# Patient Record
Sex: Male | Born: 1975 | Race: Black or African American | Hispanic: No | Marital: Married | Smoking: Never smoker
Health system: Southern US, Community
[De-identification: ages and names within clinical notes are randomized; demographics above are authoritative.]

## PROBLEM LIST (undated history)

## (undated) DIAGNOSIS — I10 Essential (primary) hypertension: Secondary | ICD-10-CM

## (undated) DIAGNOSIS — Z8619 Personal history of other infectious and parasitic diseases: Secondary | ICD-10-CM

## (undated) HISTORY — DX: Personal history of other infectious and parasitic diseases: Z86.19

---

## 2009-05-12 HISTORY — PX: ACHILLES TENDON REPAIR: SUR1153

## 2009-11-08 ENCOUNTER — Ambulatory Visit (HOSPITAL_COMMUNITY): Admission: RE | Admit: 2009-11-08 | Discharge: 2009-11-08 | Payer: Self-pay | Admitting: Orthopaedic Surgery

## 2009-12-19 ENCOUNTER — Encounter (HOSPITAL_BASED_OUTPATIENT_CLINIC_OR_DEPARTMENT_OTHER): Admission: RE | Admit: 2009-12-19 | Discharge: 2010-03-06 | Payer: Self-pay | Admitting: Internal Medicine

## 2010-01-01 ENCOUNTER — Observation Stay (HOSPITAL_COMMUNITY): Admission: RE | Admit: 2010-01-01 | Discharge: 2010-01-02 | Payer: Self-pay | Admitting: Orthopaedic Surgery

## 2010-02-07 ENCOUNTER — Ambulatory Visit (HOSPITAL_COMMUNITY): Admission: RE | Admit: 2010-02-07 | Discharge: 2010-02-07 | Payer: Self-pay | Admitting: Plastic Surgery

## 2010-07-25 LAB — CBC
HCT: 42.2 % (ref 39.0–52.0)
MCH: 27.5 pg (ref 26.0–34.0)
MCHC: 34.4 g/dL (ref 30.0–36.0)
Platelets: 195 10*3/uL (ref 150–400)
RBC: 5.27 MIL/uL (ref 4.22–5.81)
RDW: 13.4 % (ref 11.5–15.5)

## 2010-07-25 LAB — SURGICAL PCR SCREEN
MRSA, PCR: NEGATIVE
Staphylococcus aureus: NEGATIVE

## 2010-07-26 LAB — SURGICAL PCR SCREEN: MRSA, PCR: NEGATIVE

## 2010-07-26 LAB — CBC
MCH: 27.7 pg (ref 26.0–34.0)
MCV: 79.6 fL (ref 78.0–100.0)
Platelets: 204 10*3/uL (ref 150–400)
RBC: 5.09 MIL/uL (ref 4.22–5.81)
WBC: 5.9 10*3/uL (ref 4.0–10.5)

## 2010-07-29 LAB — CBC
HCT: 43.6 % (ref 39.0–52.0)
Hemoglobin: 15 g/dL (ref 13.0–17.0)
MCH: 28.7 pg (ref 26.0–34.0)
MCHC: 34.3 g/dL (ref 30.0–36.0)

## 2011-10-30 ENCOUNTER — Ambulatory Visit: Payer: Self-pay | Admitting: Internal Medicine

## 2011-10-31 ENCOUNTER — Ambulatory Visit (INDEPENDENT_AMBULATORY_CARE_PROVIDER_SITE_OTHER): Payer: Managed Care, Other (non HMO) | Admitting: Internal Medicine

## 2011-10-31 ENCOUNTER — Encounter: Payer: Self-pay | Admitting: Internal Medicine

## 2011-10-31 ENCOUNTER — Ambulatory Visit: Payer: Managed Care, Other (non HMO) | Admitting: Internal Medicine

## 2011-10-31 VITALS — BP 138/80 | HR 84 | Temp 98.6°F | Ht 75.5 in | Wt 398.0 lb

## 2011-10-31 DIAGNOSIS — R200 Anesthesia of skin: Secondary | ICD-10-CM

## 2011-10-31 DIAGNOSIS — E669 Obesity, unspecified: Secondary | ICD-10-CM

## 2011-10-31 DIAGNOSIS — K625 Hemorrhage of anus and rectum: Secondary | ICD-10-CM | POA: Insufficient documentation

## 2011-10-31 DIAGNOSIS — R209 Unspecified disturbances of skin sensation: Secondary | ICD-10-CM

## 2011-10-31 DIAGNOSIS — E66813 Obesity, class 3: Secondary | ICD-10-CM | POA: Insufficient documentation

## 2011-10-31 DIAGNOSIS — Z23 Encounter for immunization: Secondary | ICD-10-CM

## 2011-10-31 DIAGNOSIS — R202 Paresthesia of skin: Secondary | ICD-10-CM

## 2011-10-31 HISTORY — DX: Anesthesia of skin: R20.0

## 2011-10-31 NOTE — Progress Notes (Signed)
Subjective:    Patient ID: Jeffery Benson, male    DOB: October 26, 1975, 36 y.o.   MRN: [id number]  HPI  36 year old African American male to establish. Patient denies any chronic medical problems. She reports history of torn left Achilles tendon 2011.  He complains of numbness and tingling of his right great toe for the last one to 2 months. Symptoms seem to be getting worse and numbness spreading to his second digit of right foot. He has rare symptoms of his left foot. Symptoms are constant and he denies associated weakness or foot drop. He denies any history of low back pain or injury.  No change in symptoms with wearing different shoes.  We reviewed his family history. His father was diagnosed with colon cancer at age 94. He has had intermittent blood in the stool over last several months. He has some pain with defecation. His last episode was 1 or 2 weeks go.    Patient has been struggling with obesity.  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 Since then he has gained approximately 100 pounds. He has family history of type 2 diabetes.  Review of Systems  Constitutional: Negative for activity change, appetite change and unexpected weight change.  Eyes: Negative for visual disturbance.  Respiratory: Negative for cough, chest tightness and shortness of breath.   Cardiovascular: Negative for chest pain.  Genitourinary: Negative for difficulty urinating.  Neurological: Negative for headaches.  Gastrointestinal: Negative for abdominal pain, heartburn  Psych: Negative for depression or anxiety Endo:  No polyuria or polydypsia    Past Medical History  Diagnosis Date  . History of chickenpox     History   Social History  . Marital Status: Married    Spouse Name: N/A    Number of Children: N/A  . Years of Education: N/A   Occupational History  . Not on file.   Social History Main Topics  . Smoking status: Never Smoker   . Smokeless tobacco: Not on  file  . Alcohol Use: No  . Drug Use: No  . Sexually Active: Not on file   Other Topics Concern  . Not on file   Social History Narrative  . No narrative on file    Past Surgical History  Procedure Date  . Achilles tendon repair 2011    Family History  Problem Relation Age of Onset  . Cancer Mother     bladder & ovarian  . Diabetes Mother   . Cancer Father     colon  . Depression Maternal Aunt   . Depression Maternal Grandmother   . Alcohol abuse Paternal Grandfather     No Known Allergies  No current outpatient prescriptions on file prior to visit.    BP 148/96  Pulse 84  Temp 98.6 F (37 C) (Oral)  Ht 6' 3.5" (1.918 m)  Wt 398 lb (180.532 kg)  BMI 49.09 kg/m2        Objective:   Physical Exam  Constitutional: He is oriented to person, place, and time.       Pleasant, obese 36 y/o AA male  HENT:  Head: Normocephalic and atraumatic.  Right Ear: External ear normal.  Left Ear: External ear normal.       Crowded oropharynx  Eyes: EOM are normal. Pupils are equal, round, and reactive to light.  Neck: Neck supple.  Cardiovascular: Normal rate, regular rhythm and normal heart sounds.   Pulmonary/Chest: Effort normal and breath sounds normal. He  has no wheezes.  Abdominal: Soft. Bowel sounds are normal. He exhibits no mass.  Genitourinary: Guaiac negative stool.       Limited rectal exam, no external hemorrhoids   Musculoskeletal: He exhibits no edema.  Lymphadenopathy:    He has no cervical adenopathy.  Neurological: He is alert and oriented to person, place, and time. No cranial nerve deficit.       Decreased sensation to vibration of his right great toe Vibratory sense is normal in left foot and upper extremities.  Skin:       Hyperpigmented areas beneath abdominal fold and groin area  Psychiatric: He has a normal mood and affect. His behavior is normal.          Assessment & Plan:

## 2011-10-31 NOTE — Patient Instructions (Addendum)
Start weight loss program.  Goal weight loss 40 lbs over next 6 months Please complete the following lab tests within 1 week (fasting): BMET, LFTs, FLP, CBCD - v70 A1c, TSH - 782.0

## 2011-10-31 NOTE — Assessment & Plan Note (Signed)
Patient having isolated numbness and tingling of his right great toe. I doubt his symptoms are from a systemic polyneuropathy. 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 Refer to Dr. Victorino Dike for further evaluation and treatment.

## 2011-10-31 NOTE — Assessment & Plan Note (Signed)
Weight loss strategies discussed. Goal weight loss 40 pounds over the next 6 months. Screen for diabetes. Obtain fasting lipid panel.

## 2011-10-31 NOTE — Assessment & Plan Note (Signed)
36 year old [place] American male with history of colon cancer with intermittent rectal bleeding. Limited rectal exam but he does not have any external hemorrhoids. Refer to gastroenterologist for colonoscopy.

## 2011-12-22 ENCOUNTER — Other Ambulatory Visit: Payer: Managed Care, Other (non HMO)

## 2011-12-25 ENCOUNTER — Other Ambulatory Visit (INDEPENDENT_AMBULATORY_CARE_PROVIDER_SITE_OTHER): Payer: Managed Care, Other (non HMO)

## 2011-12-25 ENCOUNTER — Encounter: Payer: Self-pay | Admitting: Gastroenterology

## 2011-12-25 DIAGNOSIS — R209 Unspecified disturbances of skin sensation: Secondary | ICD-10-CM

## 2011-12-25 LAB — HEPATIC FUNCTION PANEL
AST: 37 U/L (ref 0–37)
Albumin: 4.4 g/dL (ref 3.5–5.2)
Alkaline Phosphatase: 57 U/L (ref 39–117)
Bilirubin, Direct: 0.2 mg/dL (ref 0.0–0.3)
Total Bilirubin: 1.1 mg/dL (ref 0.3–1.2)
Total Protein: 7.6 g/dL (ref 6.0–8.3)

## 2011-12-25 LAB — LIPID PANEL
Cholesterol: 199 mg/dL (ref 0–200)
HDL: 109.8 mg/dL (ref >39.00)
VLDL: 10.4 mg/dL (ref 0.0–40.0)

## 2011-12-25 LAB — CBC WITH DIFFERENTIAL/PLATELET
Eosinophils Relative: 1.6 % (ref 0.0–5.0)
HCT: 44.5 % (ref 39.0–52.0)
MCHC: 33.3 g/dL (ref 30.0–36.0)
MCV: 82.9 fl (ref 78.0–100.0)
Monocytes Absolute: 0.4 10*3/uL (ref 0.1–1.0)
Neutro Abs: 3.2 10*3/uL (ref 1.4–7.7)
Neutrophils Relative %: 53.5 % (ref 43.0–77.0)
RBC: 5.36 Mil/uL (ref 4.22–5.81)
RDW: 13.9 % (ref 11.5–14.6)
WBC: 6 10*3/uL (ref 4.5–10.5)

## 2011-12-31 ENCOUNTER — Encounter: Payer: Self-pay | Admitting: Internal Medicine

## 2011-12-31 ENCOUNTER — Ambulatory Visit (INDEPENDENT_AMBULATORY_CARE_PROVIDER_SITE_OTHER): Payer: Managed Care, Other (non HMO) | Admitting: Internal Medicine

## 2011-12-31 VITALS — BP 136/70 | HR 75 | Temp 97.6°F | Ht 75.5 in | Wt 385.0 lb

## 2011-12-31 DIAGNOSIS — K625 Hemorrhage of anus and rectum: Secondary | ICD-10-CM

## 2011-12-31 DIAGNOSIS — E669 Obesity, unspecified: Secondary | ICD-10-CM

## 2011-12-31 DIAGNOSIS — R200 Anesthesia of skin: Secondary | ICD-10-CM

## 2011-12-31 DIAGNOSIS — R209 Unspecified disturbances of skin sensation: Secondary | ICD-10-CM

## 2011-12-31 NOTE — Assessment & Plan Note (Signed)
Improved.  Modest weight loss.  Continue lifestyle modification.

## 2011-12-31 NOTE — Assessment & Plan Note (Signed)
Improved.  A1c and TSH were normal.  He defers referral to orthopedist for now.

## 2011-12-31 NOTE — Assessment & Plan Note (Signed)
Patient urged to follow up with GI for colonoscopy.  He understands and agrees to reschedule colonoscopy.

## 2011-12-31 NOTE — Progress Notes (Signed)
  Subjective:    Patient ID: Jeffery Benson, male    DOB: 1976/02/26, 36 y.o.   MRN: [id number]  HPI  36 year old African American male previously seen for numbness and tingling of his right foot and rectal bleeding for followup. Patient's blood work did not show any signs of diabetes. Patient reports numbness and tingling of his foot has gotten somewhat better. He did not followup with orthopedic physician.  Rectal bleeding-due to scheduling issues patient did not complete colonoscopy.  He plans to reschedule.  Obesity - he has been able to lose modest amount of weight  Review of Systems   Past Medical History  Diagnosis Date  . History of chickenpox     History   Social History  . Marital Status: Married    Spouse Name: N/A    Number of Children: N/A  . Years of Education: N/A   Occupational History  . Not on file.   Social History Main Topics  . Smoking status: Never Smoker   . Smokeless tobacco: Not on file  . Alcohol Use: No  . Drug Use: No  . Sexually Active: Not on file   Other Topics Concern  . Not on file   Social History Narrative  . No narrative on file    Past Surgical History  Procedure Date  . Achilles tendon repair 2011    Family History  Problem Relation Age of Onset  . Cancer Mother     bladder & ovarian  . Diabetes Mother   . Cancer Father     colon  . Depression Maternal Aunt   . Depression Maternal Grandmother   . Alcohol abuse Paternal Grandfather     No Known Allergies  No current outpatient prescriptions on file prior to visit.    BP 136/70  Pulse 75  Temp 97.6 F (36.4 C) (Oral)  Ht 6' 3.5" (1.918 m)  Wt 385 lb (174.635 kg)  BMI 47.49 kg/m2       Objective:   Physical Exam  Constitutional: He appears well-developed and well-nourished.  Cardiovascular: Normal rate, regular rhythm and normal heart sounds.   Pulmonary/Chest: Effort normal and breath sounds normal. He has no wheezes.  Skin: Skin is warm and dry.    Psychiatric: He has a normal mood and affect. His behavior is normal.          Assessment & Plan:

## 2012-07-01 ENCOUNTER — Other Ambulatory Visit: Payer: Self-pay | Admitting: Orthopaedic Surgery

## 2012-07-01 DIAGNOSIS — M25511 Pain in right shoulder: Secondary | ICD-10-CM

## 2012-07-09 ENCOUNTER — Ambulatory Visit
Admission: RE | Admit: 2012-07-09 | Discharge: 2012-07-09 | Disposition: A | Discharge status: Home / Self Care | Payer: Managed Care, Other (non HMO) | Source: Ambulatory Visit | Attending: Orthopaedic Surgery | Admitting: Orthopaedic Surgery

## 2012-07-09 DIAGNOSIS — M25511 Pain in right shoulder: Secondary | ICD-10-CM

## 2012-07-22 ENCOUNTER — Other Ambulatory Visit: Payer: Self-pay | Admitting: Orthopedic Surgery

## 2012-07-22 DIAGNOSIS — M25511 Pain in right shoulder: Secondary | ICD-10-CM

## 2012-07-23 ENCOUNTER — Ambulatory Visit
Admission: RE | Admit: 2012-07-23 | Discharge: 2012-07-23 | Disposition: A | Discharge status: Home / Self Care | Payer: Managed Care, Other (non HMO) | Source: Ambulatory Visit | Attending: Orthopedic Surgery | Admitting: Orthopedic Surgery

## 2012-07-23 DIAGNOSIS — M25511 Pain in right shoulder: Secondary | ICD-10-CM

## 2012-07-29 ENCOUNTER — Other Ambulatory Visit: Payer: Self-pay | Admitting: Orthopedic Surgery

## 2012-07-29 DIAGNOSIS — M19019 Primary osteoarthritis, unspecified shoulder: Secondary | ICD-10-CM

## 2012-09-03 ENCOUNTER — Other Ambulatory Visit: Payer: Self-pay | Admitting: Orthopedic Surgery

## 2012-09-29 ENCOUNTER — Other Ambulatory Visit (HOSPITAL_COMMUNITY): Payer: Managed Care, Other (non HMO)

## 2012-10-07 ENCOUNTER — Encounter (HOSPITAL_COMMUNITY): Admission: RE | Payer: Self-pay | Source: Ambulatory Visit

## 2012-10-07 ENCOUNTER — Ambulatory Visit (HOSPITAL_COMMUNITY)
Admission: RE | Admit: 2012-10-07 | Payer: Managed Care, Other (non HMO) | Source: Ambulatory Visit | Admitting: Orthopedic Surgery

## 2012-10-07 SURGERY — HEMIARTHROPLASTY, SHOULDER
Anesthesia: Choice | Laterality: Right

## 2012-10-28 ENCOUNTER — Inpatient Hospital Stay: Admit: 2012-10-28 | Payer: Self-pay | Admitting: Orthopedic Surgery

## 2012-10-28 SURGERY — HEMIARTHROPLASTY, SHOULDER
Anesthesia: Choice | Laterality: Right

## 2012-11-02 ENCOUNTER — Encounter (HOSPITAL_COMMUNITY): Payer: Self-pay | Admitting: Pharmacy Technician

## 2012-11-03 ENCOUNTER — Encounter (HOSPITAL_COMMUNITY)
Admission: RE | Admit: 2012-11-03 | Discharge: 2012-11-03 | Disposition: A | Discharge status: Home / Self Care | Payer: Managed Care, Other (non HMO) | Source: Ambulatory Visit | Attending: Orthopedic Surgery | Admitting: Orthopedic Surgery

## 2012-11-03 ENCOUNTER — Encounter (HOSPITAL_COMMUNITY): Payer: Self-pay

## 2012-11-03 LAB — CBC WITH DIFFERENTIAL/PLATELET
Basophils Absolute: 0 10*3/uL (ref 0.0–0.1)
Basophils Relative: 1 % (ref 0–1)
Eosinophils Absolute: 0.1 10*3/uL (ref 0.0–0.7)
Hemoglobin: 15.3 g/dL (ref 13.0–17.0)
MCH: 27.9 pg (ref 26.0–34.0)
MCHC: 35 g/dL (ref 30.0–36.0)
Monocytes Relative: 6 % (ref 3–12)
Neutro Abs: 4.5 10*3/uL (ref 1.7–7.7)
Neutrophils Relative %: 68 % (ref 43–77)
Platelets: 188 10*3/uL (ref 150–400)
RDW: 13.3 % (ref 11.5–15.5)

## 2012-11-03 LAB — TYPE AND SCREEN: Antibody Screen: NEGATIVE

## 2012-11-03 LAB — COMPREHENSIVE METABOLIC PANEL
AST: 31 U/L (ref 0–37)
Albumin: 4 g/dL (ref 3.5–5.2)
Alkaline Phosphatase: 62 U/L (ref 39–117)
BUN: 17 mg/dL (ref 6–23)
Potassium: 4.4 mEq/L (ref 3.5–5.1)
Total Protein: 7.6 g/dL (ref 6.0–8.3)

## 2012-11-03 LAB — SURGICAL PCR SCREEN
MRSA, PCR: NEGATIVE
Staphylococcus aureus: POSITIVE — AB

## 2012-11-03 LAB — URINALYSIS, ROUTINE W REFLEX MICROSCOPIC
Bilirubin Urine: NEGATIVE
Ketones, ur: NEGATIVE mg/dL
Nitrite: NEGATIVE
Specific Gravity, Urine: 1.024 (ref 1.005–1.030)
Urobilinogen, UA: 0.2 mg/dL (ref 0.0–1.0)

## 2012-11-03 LAB — APTT: aPTT: 34 seconds (ref 24–37)

## 2012-11-03 LAB — PROTIME-INR
INR: 1.08 (ref 0.00–1.49)
Prothrombin Time: 13.8 seconds (ref 11.6–15.2)

## 2012-11-03 NOTE — Progress Notes (Signed)
Patient called and voicemail left inquiring about scheduled 0800 preadmit appointment.

## 2012-11-04 NOTE — Progress Notes (Signed)
This is a 37 year old male who is scheduled for right shoulder surgery to be performed by Dr. Jones Broom on Tuesday 09 November 2012.  A chart review was requested due to an abnormal EKG. A pre-operative EKG dated 03 November 2012 has been reviewed/confirmed by Dr. Laqueta Carina.  There is no known history of coronary artery disease, prior cardiac work-up/evaluation or apparent risk factors for CAD.   This patient did not voice any cardiac related symptoms, issues or complaints during his pre-anesthesia evaluation.   CXR dated 03 November 2012 and compared to previous films dated 07 February 2010 shows: No acute cardiopulmonary process.  Pre-operative lab results dated 03 November 2012 show all values to be within the normal range.  May proceed with surgery as scheduled.  Kelton Pillar. Everhart, PA-C

## 2012-11-08 MED ORDER — POVIDONE-IODINE 7.5 % EX SOLN
Freq: Once | CUTANEOUS | Status: DC
  Filled 2012-11-08: qty 118

## 2012-11-08 MED ORDER — CEFAZOLIN SODIUM-DEXTROSE 2-3 GM-% IV SOLR
2.0000 g | INTRAVENOUS | Status: AC
  Administered 2012-11-09: 2 g via INTRAVENOUS
  Filled 2012-11-08: qty 50

## 2012-11-09 ENCOUNTER — Inpatient Hospital Stay (HOSPITAL_COMMUNITY)
Admission: RE | Admit: 2012-11-09 | Discharge: 2012-11-10 | DRG: 483 | Disposition: A | Discharge status: Home / Self Care | Payer: Managed Care, Other (non HMO) | Source: Ambulatory Visit | Attending: Orthopedic Surgery | Admitting: Orthopedic Surgery

## 2012-11-09 ENCOUNTER — Encounter (HOSPITAL_COMMUNITY): Payer: Self-pay | Admitting: Anesthesiology

## 2012-11-09 ENCOUNTER — Inpatient Hospital Stay (HOSPITAL_COMMUNITY): Payer: Managed Care, Other (non HMO)

## 2012-11-09 ENCOUNTER — Ambulatory Visit (HOSPITAL_COMMUNITY): Payer: Managed Care, Other (non HMO) | Admitting: Anesthesiology

## 2012-11-09 ENCOUNTER — Encounter (HOSPITAL_COMMUNITY)
Admission: RE | Disposition: A | Discharge status: Home / Self Care | Payer: Self-pay | Source: Ambulatory Visit | Attending: Orthopedic Surgery

## 2012-11-09 DIAGNOSIS — Z01818 Encounter for other preprocedural examination: Secondary | ICD-10-CM

## 2012-11-09 DIAGNOSIS — M19019 Primary osteoarthritis, unspecified shoulder: Secondary | ICD-10-CM

## 2012-11-09 DIAGNOSIS — R9431 Abnormal electrocardiogram [ECG] [EKG]: Secondary | ICD-10-CM | POA: Diagnosis present

## 2012-11-09 DIAGNOSIS — Z6841 Body Mass Index (BMI) 40.0 and over, adult: Secondary | ICD-10-CM

## 2012-11-09 DIAGNOSIS — M12519 Traumatic arthropathy, unspecified shoulder: Principal | ICD-10-CM | POA: Diagnosis present

## 2012-11-09 DIAGNOSIS — Z01810 Encounter for preprocedural cardiovascular examination: Secondary | ICD-10-CM

## 2012-11-09 DIAGNOSIS — Z87828 Personal history of other (healed) physical injury and trauma: Secondary | ICD-10-CM

## 2012-11-09 DIAGNOSIS — Z01812 Encounter for preprocedural laboratory examination: Secondary | ICD-10-CM

## 2012-11-09 DIAGNOSIS — Z96611 Presence of right artificial shoulder joint: Secondary | ICD-10-CM

## 2012-11-09 HISTORY — PX: SHOULDER HEMI-ARTHROPLASTY: SHX5049

## 2012-11-09 SURGERY — HEMIARTHROPLASTY, SHOULDER
Anesthesia: General | Site: Shoulder | Laterality: Right | Wound class: Clean

## 2012-11-09 MED ORDER — POLYETHYLENE GLYCOL 3350 17 G PO PACK: 17.0000 g | PACK | Freq: Every day | ORAL | Status: DC | PRN

## 2012-11-09 MED ORDER — OXYCODONE-ACETAMINOPHEN 5-325 MG PO TABS
1.0000 | ORAL_TABLET | ORAL | Status: DC | PRN
  Administered 2012-11-10 (×3): 2 via ORAL
  Filled 2012-11-09 (×3): qty 2

## 2012-11-09 MED ORDER — OXYCODONE HCL 5 MG PO TABS: 5.0000 mg | ORAL_TABLET | ORAL | Status: DC | PRN

## 2012-11-09 MED ORDER — NEOSTIGMINE METHYLSULFATE 1 MG/ML IJ SOLN
INTRAMUSCULAR | Status: DC | PRN
  Administered 2012-11-09: 4 mg via INTRAVENOUS

## 2012-11-09 MED ORDER — ONDANSETRON HCL 4 MG/2ML IJ SOLN
INTRAMUSCULAR | Status: DC | PRN
  Administered 2012-11-09: 4 mg via INTRAVENOUS

## 2012-11-09 MED ORDER — ACETAMINOPHEN 325 MG PO TABS: 650.0000 mg | ORAL_TABLET | Freq: Four times a day (QID) | ORAL | Status: DC | PRN

## 2012-11-09 MED ORDER — ARTIFICIAL TEARS OP OINT
TOPICAL_OINTMENT | OPHTHALMIC | Status: DC | PRN
  Administered 2012-11-09: 1 via OPHTHALMIC

## 2012-11-09 MED ORDER — ACETAMINOPHEN 650 MG RE SUPP: 650.0000 mg | Freq: Four times a day (QID) | RECTAL | Status: DC | PRN

## 2012-11-09 MED ORDER — MORPHINE SULFATE 2 MG/ML IJ SOLN: 2.0000 mg | INTRAMUSCULAR | Status: DC | PRN

## 2012-11-09 MED ORDER — ROCURONIUM BROMIDE 100 MG/10ML IV SOLN
INTRAVENOUS | Status: DC | PRN
  Administered 2012-11-09 (×3): 20 mg via INTRAVENOUS
  Administered 2012-11-09: 10 mg via INTRAVENOUS
  Administered 2012-11-09: 30 mg via INTRAVENOUS

## 2012-11-09 MED ORDER — LIDOCAINE HCL (CARDIAC) 20 MG/ML IV SOLN
INTRAVENOUS | Status: DC | PRN
  Administered 2012-11-09: 100 mg via INTRAVENOUS

## 2012-11-09 MED ORDER — MEPERIDINE HCL 25 MG/ML IJ SOLN: 6.2500 mg | INTRAMUSCULAR | Status: DC | PRN

## 2012-11-09 MED ORDER — METOCLOPRAMIDE HCL 5 MG/ML IJ SOLN: 5.0000 mg | Freq: Three times a day (TID) | INTRAMUSCULAR | Status: DC | PRN

## 2012-11-09 MED ORDER — DEXAMETHASONE SODIUM PHOSPHATE 4 MG/ML IJ SOLN
INTRAMUSCULAR | Status: DC | PRN
  Administered 2012-11-09: 10 mg via INTRAVENOUS

## 2012-11-09 MED ORDER — FENTANYL CITRATE 0.05 MG/ML IJ SOLN: 25.0000 ug | INTRAMUSCULAR | Status: DC | PRN

## 2012-11-09 MED ORDER — FENTANYL CITRATE 0.05 MG/ML IJ SOLN
INTRAMUSCULAR | Status: DC | PRN
  Administered 2012-11-09 (×4): 50 ug via INTRAVENOUS

## 2012-11-09 MED ORDER — LACTATED RINGERS IV SOLN
INTRAVENOUS | Status: DC | PRN
  Administered 2012-11-09 (×2): via INTRAVENOUS

## 2012-11-09 MED ORDER — CEFAZOLIN SODIUM-DEXTROSE 2-3 GM-% IV SOLR
2.0000 g | Freq: Four times a day (QID) | INTRAVENOUS | Status: AC
  Administered 2012-11-09 – 2012-11-10 (×3): 2 g via INTRAVENOUS
  Filled 2012-11-09 (×4): qty 50

## 2012-11-09 MED ORDER — ALUMINUM HYDROXIDE GEL 320 MG/5ML PO SUSP
15.0000 mL | ORAL | Status: DC | PRN
  Filled 2012-11-09: qty 30

## 2012-11-09 MED ORDER — ASPIRIN EC 325 MG PO TBEC
325.0000 mg | DELAYED_RELEASE_TABLET | Freq: Two times a day (BID) | ORAL | Status: DC
  Administered 2012-11-09 – 2012-11-10 (×2): 325 mg via ORAL
  Filled 2012-11-09 (×4): qty 1

## 2012-11-09 MED ORDER — DOCUSATE SODIUM 100 MG PO CAPS
100.0000 mg | ORAL_CAPSULE | Freq: Two times a day (BID) | ORAL | Status: DC
  Administered 2012-11-09 – 2012-11-10 (×2): 100 mg via ORAL
  Filled 2012-11-09 (×4): qty 1

## 2012-11-09 MED ORDER — DIPHENHYDRAMINE HCL 12.5 MG/5ML PO ELIX: 12.5000 mg | ORAL_SOLUTION | ORAL | Status: DC | PRN

## 2012-11-09 MED ORDER — ROPIVACAINE HCL 5 MG/ML IJ SOLN
INTRAMUSCULAR | Status: DC | PRN
  Administered 2012-11-09 (×2): 15 mL

## 2012-11-09 MED ORDER — FLEET ENEMA 7-19 GM/118ML RE ENEM: 1.0000 | ENEMA | Freq: Once | RECTAL | Status: AC | PRN

## 2012-11-09 MED ORDER — SODIUM CHLORIDE 0.9 % IV SOLN
INTRAVENOUS | Status: DC
  Administered 2012-11-09: 21:00:00 via INTRAVENOUS

## 2012-11-09 MED ORDER — METHOCARBAMOL 500 MG PO TABS: 500.0000 mg | ORAL_TABLET | Freq: Four times a day (QID) | ORAL | Status: DC | PRN

## 2012-11-09 MED ORDER — MIDAZOLAM HCL 5 MG/5ML IJ SOLN
INTRAMUSCULAR | Status: DC | PRN
  Administered 2012-11-09: 2 mg via INTRAVENOUS

## 2012-11-09 MED ORDER — PROMETHAZINE HCL 25 MG/ML IJ SOLN: 6.2500 mg | INTRAMUSCULAR | Status: DC | PRN

## 2012-11-09 MED ORDER — OXYCODONE-ACETAMINOPHEN 5-325 MG PO TABS: 1.0000 | ORAL_TABLET | ORAL | Status: DC | PRN

## 2012-11-09 MED ORDER — PHENYLEPHRINE HCL 10 MG/ML IJ SOLN
INTRAMUSCULAR | Status: DC | PRN
  Administered 2012-11-09: 40 ug via INTRAVENOUS
  Administered 2012-11-09: 80 ug via INTRAVENOUS
  Administered 2012-11-09: 40 ug via INTRAVENOUS
  Administered 2012-11-09 (×2): 80 ug via INTRAVENOUS

## 2012-11-09 MED ORDER — ONDANSETRON HCL 4 MG/2ML IJ SOLN: 4.0000 mg | Freq: Four times a day (QID) | INTRAMUSCULAR | Status: DC | PRN

## 2012-11-09 MED ORDER — PROPOFOL 10 MG/ML IV BOLUS
INTRAVENOUS | Status: DC | PRN
  Administered 2012-11-09: 250 mg via INTRAVENOUS

## 2012-11-09 MED ORDER — MENTHOL 3 MG MT LOZG: 1.0000 | LOZENGE | OROMUCOSAL | Status: DC | PRN

## 2012-11-09 MED ORDER — SUCCINYLCHOLINE CHLORIDE 20 MG/ML IJ SOLN
INTRAMUSCULAR | Status: DC | PRN
  Administered 2012-11-09: 120 mg via INTRAVENOUS

## 2012-11-09 MED ORDER — METHOCARBAMOL 100 MG/ML IJ SOLN
500.0000 mg | Freq: Four times a day (QID) | INTRAVENOUS | Status: DC | PRN
  Filled 2012-11-09: qty 5

## 2012-11-09 MED ORDER — SODIUM CHLORIDE 0.9 % IR SOLN
Status: DC | PRN
  Administered 2012-11-09: 1000 mL

## 2012-11-09 MED ORDER — METOCLOPRAMIDE HCL 10 MG PO TABS
5.0000 mg | ORAL_TABLET | Freq: Three times a day (TID) | ORAL | Status: DC | PRN
Start: 2012-11-09 — End: 2012-11-10

## 2012-11-09 MED ORDER — PHENOL 1.4 % MT LIQD: 1.0000 | OROMUCOSAL | Status: DC | PRN

## 2012-11-09 MED ORDER — MIDAZOLAM HCL 2 MG/2ML IJ SOLN: 0.5000 mg | Freq: Once | INTRAMUSCULAR | Status: DC | PRN

## 2012-11-09 MED ORDER — ONDANSETRON HCL 4 MG PO TABS: 4.0000 mg | ORAL_TABLET | Freq: Four times a day (QID) | ORAL | Status: DC | PRN

## 2012-11-09 MED ORDER — HYDROCODONE-ACETAMINOPHEN 5-325 MG PO TABS: 1.0000 | ORAL_TABLET | ORAL | Status: DC | PRN

## 2012-11-09 MED ORDER — KETOROLAC TROMETHAMINE 30 MG/ML IJ SOLN: 15.0000 mg | Freq: Once | INTRAMUSCULAR | Status: DC | PRN

## 2012-11-09 MED ORDER — BISACODYL 5 MG PO TBEC: 5.0000 mg | DELAYED_RELEASE_TABLET | Freq: Every day | ORAL | Status: DC | PRN

## 2012-11-09 MED ORDER — ZOLPIDEM TARTRATE 5 MG PO TABS: 5.0000 mg | ORAL_TABLET | Freq: Every evening | ORAL | Status: DC | PRN

## 2012-11-09 MED ORDER — GLYCOPYRROLATE 0.2 MG/ML IJ SOLN
INTRAMUSCULAR | Status: DC | PRN
  Administered 2012-11-09: 0.6 mg via INTRAVENOUS

## 2012-11-09 SURGICAL SUPPLY — 70 items
ASSEMBLY NECK TAPER FIXED 135 (Orthopedic Implant) ×2 IMPLANT
BIT DRILL 5/64X5 DISP (BIT) ×2 IMPLANT
BLADE SAW SAG 73X25 THK (BLADE) ×1
BLADE SAW SGTL 73X25 THK (BLADE) ×1 IMPLANT
BLADE SURG 15 STRL LF DISP TIS (BLADE) ×2 IMPLANT
BLADE SURG 15 STRL SS (BLADE) ×2
BOWL SMART MIX CTS (DISPOSABLE) ×2 IMPLANT
CHLORAPREP W/TINT 26ML (MISCELLANEOUS) ×2 IMPLANT
CLOTH BEACON ORANGE TIMEOUT ST (SAFETY) ×2 IMPLANT
COVER SURGICAL LIGHT HANDLE (MISCELLANEOUS) ×2 IMPLANT
DRAPE INCISE IOBAN 66X45 STRL (DRAPES) ×4 IMPLANT
DRAPE SURG 17X23 STRL (DRAPES) ×2 IMPLANT
DRAPE U-SHAPE 47X51 STRL (DRAPES) ×2 IMPLANT
DRSG MEPILEX BORDER 4X12 (GAUZE/BANDAGES/DRESSINGS) ×2 IMPLANT
DRSG MEPILEX BORDER 4X4 (GAUZE/BANDAGES/DRESSINGS) ×2 IMPLANT
DRSG PAD ABDOMINAL 8X10 ST (GAUZE/BANDAGES/DRESSINGS) ×4 IMPLANT
ELECT BLADE 4.0 EZ CLEAN MEGAD (MISCELLANEOUS) ×2
ELECT CAUTERY BLADE 6.4 (BLADE) ×2 IMPLANT
ELECT REM PT RETURN 9FT ADLT (ELECTROSURGICAL) ×2
ELECTRODE BLDE 4.0 EZ CLN MEGD (MISCELLANEOUS) ×1 IMPLANT
ELECTRODE REM PT RTRN 9FT ADLT (ELECTROSURGICAL) ×1 IMPLANT
EVACUATOR 1/8 PVC DRAIN (DRAIN) ×4 IMPLANT
GLOVE BIO SURGEON STRL SZ7 (GLOVE) ×2 IMPLANT
GLOVE BIO SURGEON STRL SZ7.5 (GLOVE) ×2 IMPLANT
GLOVE BIOGEL PI IND STRL 8 (GLOVE) ×1 IMPLANT
GLOVE BIOGEL PI INDICATOR 8 (GLOVE) ×1
GOWN PREVENTION PLUS LG XLONG (DISPOSABLE) ×2 IMPLANT
GOWN STRL NON-REIN LRG LVL3 (GOWN DISPOSABLE) ×4 IMPLANT
HANDPIECE INTERPULSE COAX TIP (DISPOSABLE) ×1
HEAD HUMERAL ECC 52X18MM (Head) ×2 IMPLANT
HEMOSTAT SURGICEL 2X14 (HEMOSTASIS) IMPLANT
HOOD PEEL AWAY FACE SHEILD DIS (HOOD) ×4 IMPLANT
KIT BASIN OR (CUSTOM PROCEDURE TRAY) ×2 IMPLANT
KIT ROOM TURNOVER OR (KITS) ×2 IMPLANT
MANIFOLD NEPTUNE II (INSTRUMENTS) ×2 IMPLANT
NEEDLE HYPO 25GX1X1/2 BEV (NEEDLE) ×2 IMPLANT
NEEDLE MAYO TROCAR (NEEDLE) ×2 IMPLANT
NS IRRIG 1000ML POUR BTL (IV SOLUTION) ×2 IMPLANT
PACK SHOULDER (CUSTOM PROCEDURE TRAY) ×2 IMPLANT
PAD ARMBOARD 7.5X6 YLW CONV (MISCELLANEOUS) ×4 IMPLANT
RETRIEVER SUT HEWSON (MISCELLANEOUS) ×2 IMPLANT
SET HNDPC FAN SPRY TIP SCT (DISPOSABLE) ×1 IMPLANT
SET PAD SHOULDER ACCESS (MISCELLANEOUS) ×2 IMPLANT
SLING ARM IMMOBILIZER LRG (SOFTGOODS) ×2 IMPLANT
SLING ARM IMMOBILIZER MED (SOFTGOODS) IMPLANT
SPONGE GAUZE 4X4 12PLY (GAUZE/BANDAGES/DRESSINGS) ×2 IMPLANT
SPONGE LAP 18X18 X RAY DECT (DISPOSABLE) ×2 IMPLANT
STAPLER VISISTAT 35W (STAPLE) ×2 IMPLANT
STEM GLOBAL AP 12MM (Stem) ×2 IMPLANT
STRIP CLOSURE SKIN 1/2X4 (GAUZE/BANDAGES/DRESSINGS) ×2 IMPLANT
SUCTION FRAZIER TIP 10 FR DISP (SUCTIONS) ×2 IMPLANT
SUPPORT WRAP ARM LG (MISCELLANEOUS) ×2 IMPLANT
SUT ETHIBOND NAB CT1 #1 30IN (SUTURE) ×8 IMPLANT
SUT ETHILON 3 0 PS 1 (SUTURE) ×2 IMPLANT
SUT FIBERWIRE #2 38 T-5 BLUE (SUTURE) ×6
SUT MNCRL AB 4-0 PS2 18 (SUTURE) ×2 IMPLANT
SUT SILK 2 0 TIES 17X18 (SUTURE) ×1
SUT SILK 2-0 18XBRD TIE BLK (SUTURE) ×1 IMPLANT
SUT VIC AB 0 CT1 27 (SUTURE) ×1
SUT VIC AB 0 CT1 27XBRD ANBCTR (SUTURE) ×1 IMPLANT
SUT VIC AB 0 CTB1 27 (SUTURE) ×4 IMPLANT
SUT VIC AB 2-0 CT1 27 (SUTURE) ×3
SUT VIC AB 2-0 CT1 TAPERPNT 27 (SUTURE) ×3 IMPLANT
SUTURE FIBERWR #2 38 T-5 BLUE (SUTURE) ×3 IMPLANT
SYR CONTROL 10ML LL (SYRINGE) ×2 IMPLANT
TOWEL OR 17X24 6PK STRL BLUE (TOWEL DISPOSABLE) ×2 IMPLANT
TOWEL OR 17X26 10 PK STRL BLUE (TOWEL DISPOSABLE) ×2 IMPLANT
TRAY FOLEY CATH 14FR (SET/KITS/TRAYS/PACK) IMPLANT
WATER STERILE IRR 1000ML POUR (IV SOLUTION) ×2 IMPLANT
YANKAUER SUCT BULB TIP NO VENT (SUCTIONS) ×2 IMPLANT

## 2012-11-09 NOTE — Anesthesia Postprocedure Evaluation (Signed)
Anesthesia Post Note  Patient: Jeffery Benson  Procedure(s) Performed: Procedure(s) (LRB): RIGHT SHOULDER HEMI-ARTHROPLASTY (Right)  Anesthesia type: GA  Patient location: PACU  Post pain: Pain level controlled  Post assessment: Post-op Vital signs reviewed  Last Vitals:  Filed Vitals:   11/09/12 1145  BP: 141/72  Pulse: 73  Temp:   Resp: 25    Post vital signs: Reviewed  Level of consciousness: sedated  Complications: No apparent anesthesia complications

## 2012-11-09 NOTE — Anesthesia Preprocedure Evaluation (Signed)
Anesthesia Evaluation  Patient identified by MRN, date of birth, ID band Patient awake    Reviewed: Allergy & Precautions, H&P , Patient's Chart, lab work & pertinent test results, reviewed documented beta blocker date and time   History of Anesthesia Complications Negative for: history of anesthetic complications  Airway Mallampati: III TM Distance: >3 FB Neck ROM: full    Dental no notable dental hx.    Pulmonary neg pulmonary ROS,  breath sounds clear to auscultation  Pulmonary exam normal       Cardiovascular Exercise Tolerance: Good negative cardio ROS  Rhythm:regular Rate:Normal     Neuro/Psych negative neurological ROS  negative psych ROS   GI/Hepatic negative GI ROS, Neg liver ROS,   Endo/Other  negative endocrine ROSMorbid obesity  Renal/GU negative Renal ROS     Musculoskeletal   Abdominal   Peds  Hematology negative hematology ROS (+)   Anesthesia Other Findings   Reproductive/Obstetrics negative OB ROS                           Anesthesia Physical Anesthesia Plan  ASA: III  Anesthesia Plan: General ETT   Post-op Pain Management:    Induction:   Airway Management Planned:   Additional Equipment:   Intra-op Plan:   Post-operative Plan:   Informed Consent: I have reviewed the patients History and Physical, chart, labs and discussed the procedure including the risks, benefits and alternatives for the proposed anesthesia with the patient or authorized representative who has indicated his/her understanding and acceptance.   Dental Advisory Given  Plan Discussed with: CRNA and Surgeon  Anesthesia Plan Comments:         Anesthesia Quick Evaluation

## 2012-11-09 NOTE — Progress Notes (Signed)
UR Chart Review Completed  

## 2012-11-09 NOTE — Preoperative (Signed)
Beta Blockers   Reason not to administer Beta Blockers:Not Applicable 

## 2012-11-09 NOTE — Anesthesia Procedure Notes (Addendum)
Anesthesia Regional Block:  Interscalene brachial plexus block  Pre-Anesthetic Checklist: ,, timeout performed, Correct Patient, Correct Site, Correct Laterality, Correct Procedure, Correct Position, site marked, Risks and benefits discussed,  Surgical consent,  Pre-op evaluation,  At surgeon's request and post-op pain management  Laterality: Right  Prep: chloraprep       Needles:  Injection technique: Single-shot  Needle Type: Stimiplex          Additional Needles:  Procedures: ultrasound guided (picture in chart) Interscalene brachial plexus block Narrative:  Start time: 11/09/2012 7:00 AM End time: 11/09/2012 7:20 AM Injection made incrementally with aspirations every 5 mL.  Performed by: Personally   Additional Notes: Risks, benefits and alternative to block explained extensively.  Patient tolerated procedure well, without complications.  Image acquired and in chart  Supraclavicular block Procedure Name: Intubation Date/Time: 11/09/2012 7:50 AM Performed by: Gayla Medicus Pre-anesthesia Checklist: Patient identified, Timeout performed, Emergency Drugs available, Suction available and Patient being monitored Patient Re-evaluated:Patient Re-evaluated prior to inductionOxygen Delivery Method: Circle system utilized Preoxygenation: Pre-oxygenation with 100% oxygen Intubation Type: IV induction Ventilation: Mask ventilation without difficulty and Oral airway inserted - appropriate to patient size Laryngoscope Size: Mac and 4 Grade View: Grade II Tube type: Oral Tube size: 7.5 mm Number of attempts: 2 Airway Equipment and Method: Stylet and Video-laryngoscopy Placement Confirmation: ETT inserted through vocal cords under direct vision,  positive ETCO2 and breath sounds checked- equal and bilateral Secured at: 24 cm Tube secured with: Tape Dental Injury: Teeth and Oropharynx as per pre-operative assessment  Difficulty Due To: Difficulty was anticipated Future  Recommendations: Recommend- induction with short-acting agent, and alternative techniques readily available Comments: Easy mask w/ 9cm OA. First DL attempt unsuccessful with smaller glidescope blade d/t large tongue; second attempt with larger glidescope blade successful; both attempts atraumatic. VSS throughout. K Hypes, CRNA

## 2012-11-09 NOTE — Op Note (Signed)
Procedure(s): RIGHT SHOULDER HEMI-ARTHROPLASTY Procedure Note  Jeffery Benson male 37 y.o. 11/09/2012  Procedure(s) and Anesthesia Type:    * RIGHT SHOULDER HEMI-ARTHROPLASTY - Choice  Surgeon(s) and Role:    * Mable Paris, MD - Primary   Indications:  37 y.o. male  With endstage right shoulder arthritis. Pain and dysfunction interfered with quality of life and nonoperative treatment with activity modification, NSAIDS and injections failed.     Surgeon: Mable Paris   Assistants: Damita Lack PA-C [facility] was present and scrubbed throughout the procedure and was essential in positioning, retraction, exposure, and closure)  Anesthesia: General endotracheal anesthesia with preoperative interscalene block    Procedure Detail  RIGHT SHOULDER HEMI-ARTHROPLASTY  Findings: DePuy size 12 porous coated press stem with a 52 x 18 eccentric head. The glenoid was noted to be posteriorly worn and slightly retroverted. After careful consideration of options for the glenoid it was determined that the best course of treatment for him would be to leave it alone and perform a arthroplasty. Hypertrophied labrum and significant amount of contracted anterior capsule were resected.  Estimated Blood Loss:  300 mL         Drains: 1 medium hemovac  Blood Given: none          Specimens: none        Complications:  * No complications entered in OR log *         Disposition: PACU - hemodynamically stable.         Condition: stable    Procedure:   The patient was identified in the preoperative holding area where I personally marked the operative extremity after verifying with the patient and consent. He  was taken to the operating room where He was transferred to the   operative table.  The patient received an interscalene block in   the holding area by the attending anesthesiologist.  General anesthesia was induced   in the operating room without complication.   The patient did receive IV  Ancef prior to the commencement of the procedure.  The patient was   placed in the beach-chair position with the back raised about 30   degrees.  The nonoperative extremity and head and neck were carefully   positioned and padded protecting against neurovascular compromise.  The   left upper extremity was then prepped and draped in the standard sterile   fashion.    The appropriate operative time-out was performed with   Anesthesia, the perioperative staff, as well as myself and we all agreed   that the right side was the correct operative site.  An approximately   10 cm incision was made from the tip of the coracoid to the center point of the   humerus at the level of the axilla.  Dissection was carried down sharply   through subcutaneous tissues and cephalic vein was identified and taken   laterally with the deltoid.  The pectoralis major was taken medially.  The   upper 1 cm of the pectoralis major was released from its attachment on   the humerus.  The clavipectoral fascia was incised just lateral to the   conjoined tendon.  This incision was carried up to but not into the   coracoacromial ligament.  Digital palpation was used to prove   integrity of the axillary nerve which was protected throughout the   procedure.  Musculocutaneous nerve was not palpated in the operative   field.  Conjoined tendon was then retracted  gently medially and the   deltoid laterally.  Anterior circumflex humeral vessels were clamped and   coagulated.  The soft tissues overlying the biceps was incised and this   incision was carried across the transverse humeral ligament to the base   of the coracoid.  The biceps was tenodesed to the soft tissue just above   pectoralis major and the remaining portion of the biceps superiorly was   excised.  An osteotomy was performed at the lesser tuberosity and the   subscapularis was freed from the underlying capsule.  Capsule was then    released all the way down to the 6 o'clock position of the humeral head.   The humeral head was then delivered with simultaneous adduction,   extension and external rotation.  He was noted to have extensive degeneration of the humeral head with large cavitary defects and a very large inferior humeral head osteophyte. All humeral osteophytes were removed   and the anatomic neck of the humerus was marked and cut free hand at   approximately 25 degrees retroversion within about 3 mm of the cuff   reflection posteriorly.  The head size was estimated to be a 52 medium   offset.  At that point, the humeral head was retracted posteriorly with   a Fukuda retractor and the anterior-inferior capsule was excised.   Remaining portion of the capsule was released at the base of the   coracoid.  The remaining biceps anchor and the entire anterior-inferior   labrum was excised.  The posterior labrum was diminutive and was not released or excised.  The glenoid was examined. There was a very slight biconcavity which was not dramatic. The anterior glenoid still had some cartilage intact. Posteriorly the cartilage was gone. I felt that at this point the amount of defect did not warrant bone grafting and that a significant amount of the posterior intact glenoid and need to be removed it bone grafting was to be considered. I did not feel that the risk is worth the benefit in this patient.  The proximal humerus was then again exposed taking care not to displace the glenoid.    The humerus was then sequentially reamed going from 6 to 12 by 2 mm incriments. The 12 mm reamer was found to have appropriate cortical contact.  A   box osteotome was then used and a 12-mm broach.  The broach handle was   removed and the trial head was placed.   Calcar reamer was used.The eccentric 52 x 18 head fit best.    With trial reduction there was no tendency towards posterior instability. The trial was removed and the final implant was prepared  on a back table.  The trial was removed and the final implant was prepared on a back table.   Small holes were drilled on both sides of the lesser tuberosity osteotomy, through which 3 #2 FiberWires were passed. The implant was then placed through the loop of all 3 #2 FiberWires and impacted with an excellent press-fit. This achieved excellent anatomic reconstruction of the proximal humerus.  The joint was then copiously irrigated with pulse lavage.  The subscapularis and   lesser tuberosity osteotomy were then repaired using the 3 #2 fiber wires previously passed.   One #1 Ethibond was placed at the rotator interval just above   the lesser tuberosity. After repair of the lesser tuberosity, a medium   Hemovac was placed out anterolaterally and again copious irrigation was   used.  Skin was closed with 2-0 Vicryl sutures in the deep dermal layer and 4-0 Monocryl in a subcuticular  running fashion.  Sterile dressings were then applied including Steri- Strips, 4x4s, ABDs and tape.  The patient was placed in a sling and allowed to awaken from general anesthesia and taken to the recovery room in stable  condition.      POSTOPERATIVE PLAN:  Early passive range of motion will be allowed with the goal of 30 degrees external rotation and 130 degrees forward elevation.  No internal rotation at this time.  No active motion of the arm until the lesser tuberosity heals.  The patient will likely be kept in the hospital for 1-2 days and then discharged home.

## 2012-11-09 NOTE — Transfer of Care (Signed)
Immediate Anesthesia Transfer of Care Note  Patient: Jeffery Benson  Procedure(s) Performed: Procedure(s): RIGHT SHOULDER HEMI-ARTHROPLASTY (Right)  Patient Location: PACU  Anesthesia Type:General  Level of Consciousness: awake, alert  and oriented  Airway & Oxygen Therapy: Patient Spontanous Breathing and Patient connected to nasal cannula oxygen  Post-op Assessment: Report given to PACU RN, Post -op Vital signs reviewed and stable and Patient moving all extremities X 4  Post vital signs: Reviewed and stable  Complications: No apparent anesthesia complications

## 2012-11-09 NOTE — H&P (Signed)
Jeffery Benson is an 37 y.o. male.   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 He has small posterior glenoid defect. Pain and dysfunction interfered with quality of life. He has failed conservative management and wished to go forward with an arthroplasty procedure to decrease pain and increase function.  Past Medical History  Diagnosis Date  . History of chickenpox     Past Surgical History  Procedure Laterality Date  . Achilles tendon repair  2011    Family History  Problem Relation Age of Onset  . Cancer Mother     bladder & ovarian  . Diabetes Mother   . Cancer Father     colon  . Depression Maternal Aunt   . Depression Maternal Grandmother   . Alcohol abuse Paternal Grandfather    Social History:  reports that he has never smoked. He does not have any smokeless tobacco history on file. He reports that  drinks alcohol. He reports that he does not use illicit drugs.  Allergies: No Known Allergies  No prescriptions prior to admission    No results found for this or any previous visit (from the past 48 hour(s)). No results found.  Review of Systems  All other systems reviewed and are negative.    There were no vitals taken for this visit. Physical Exam  Constitutional: He is oriented to person, place, and time. He appears well-developed and well-nourished.  HENT:  Head: Atraumatic.  Eyes: EOM are normal.  Cardiovascular: Intact distal pulses.   Respiratory: Effort normal.  Musculoskeletal:       Right shoulder: He exhibits decreased range of motion and pain. He exhibits no deformity and normal pulse.  Neurological: He is alert and oriented to person, place, and time.  Skin: Skin is warm and dry.  Psychiatric: He has a normal mood and affect.     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 Risks / benefits of surgery discussed Consent on chart  NPO for OR Preop antibiotics   Jozelyn Kuwahara WILLIAM 11/09/2012, 7:14 AM

## 2012-11-10 LAB — BASIC METABOLIC PANEL
CO2: 24 mEq/L (ref 19–32)
Chloride: 101 mEq/L (ref 96–112)
Glucose, Bld: 166 mg/dL — ABNORMAL HIGH (ref 70–99)
Potassium: 3.5 mEq/L (ref 3.5–5.1)
Sodium: 135 mEq/L (ref 135–145)

## 2012-11-10 LAB — CBC
Hemoglobin: 12.7 g/dL — ABNORMAL LOW (ref 13.0–17.0)
MCH: 27.9 pg (ref 26.0–34.0)
Platelets: 167 10*3/uL (ref 150–400)
RBC: 4.56 MIL/uL (ref 4.22–5.81)
WBC: 13.6 10*3/uL — ABNORMAL HIGH (ref 4.0–10.5)

## 2012-11-10 NOTE — Evaluation (Signed)
Occupational Therapy Evaluation Patient Details Name: Jeffery Benson MRN: [id number] DOB: 12/31/1975 Today's Date: 11/10/2012 Time: 1008-1100 OT Time Calculation (min): 52 min  OT Assessment / Plan / Recommendation History of present illness s/p R shoulder hemiarthroplasty   Clinical Impression   Pt instructed in R shoulder PROM in supine, AROM elbow to hand, hemitechniques for ADL, sling use, bed and chair positioning.  Verbalized and/or demonstrated all.  No further OT needs.    OT Assessment  Patient does not need any further OT services    Follow Up Recommendations  No OT follow up    Barriers to Discharge      Equipment Recommendations       Recommendations for Other Services    Frequency       Precautions / Restrictions Precautions Precautions: Shoulder Type of Shoulder Precautions:  R shoulder FF to 130, ER to 30 per Dr. Veda Canning protcol Shoulder Interventions: Shoulder sling/immobilizer Precaution Booklet Issued: Yes (comment) Precaution Comments: shoulder protocol   Pertinent Vitals/Pain "mild" R shoulder, premedicated, repositioned    ADL  Eating/Feeding: Independent Where Assessed - Eating/Feeding: Edge of bed Grooming: Modified independent;Wash/dry hands Where Assessed - Grooming: Unsupported standing Upper Body Bathing: Modified independent Where Assessed - Upper Body Bathing: Unsupported sitting Lower Body Bathing: Modified independent Where Assessed - Lower Body Bathing: Supported sit to stand Upper Body Dressing: Modified independent Where Assessed - Upper Body Dressing: Unsupported sitting Lower Body Dressing: Modified independent Where Assessed - Lower Body Dressing: Unsupported sitting;Supported sit to stand Toilet Transfer: Independent Toilet Transfer Method: Sit to Barista: Regular height toilet Toileting - Clothing Manipulation and Hygiene: Modified independent Where Assessed - Engineer, mining and  Hygiene: Sit to stand from 3-in-1 or toilet Equipment Used:  (sling) Transfers/Ambulation Related to ADLs: independent ADL Comments: Instructed in hemitechniques and sling donning and doffing.    OT Diagnosis:    OT Problem List:   OT Treatment Interventions:     OT Goals(Current goals can be found in the care plan section) Acute Rehab OT Goals Patient Stated Goal: Regain use of R shoulder.  Visit Information  Last OT Received On: 11/10/12 History of Present Illness: s/p R shoulder hemiarthroplasty       Prior Functioning     Home Living Family/patient expects to be discharged to:: Private residence Living Arrangements: Spouse/significant other;Children Prior Function Level of Independence: Independent Communication Communication: No difficulties Dominant Hand: Right         Vision/Perception Vision - History Baseline Vision: Wears glasses all the time Patient Visual Report: No change from baseline   Cognition  Cognition Arousal/Alertness: Awake/alert Behavior During Therapy: WFL for tasks assessed/performed Overall Cognitive Status: Within Functional Limits for tasks assessed    Extremity/Trunk Assessment Upper Extremity Assessment Upper Extremity Assessment: RUE deficits/detail RUE Deficits / Details: R UE in sling Cervical / Trunk Assessment Cervical / Trunk Assessment: Normal     Mobility Bed Mobility Bed Mobility: Supine to Sit;Sit to Supine Supine to Sit: 7: Independent Sit to Supine: 7: Independent Transfers Transfers: Sit to Stand;Stand to Sit Sit to Stand: 7: Independent Stand to Sit: 7: Independent     Exercise     Balance     End of Session OT - End of Session Activity Tolerance: Patient tolerated treatment well Patient left: in bed;with call bell/phone within reach;with family/visitor present  GO     Evern Bio 11/10/2012, 11:09 AM [phone]

## 2012-11-10 NOTE — Progress Notes (Signed)
PATIENT ID: Jeffery Benson   1 Day Post-Op Procedure(s) (LRB): RIGHT SHOULDER HEMI-ARTHROPLASTY (Right)  Subjective: feeling pretty good today.  Some soreness in the right arm, but is tolerating the pain well with the Percocet.  No other complaints or concerns today.  Objective:  Filed Vitals:   11/10/12 0544  BP: 160/59  Pulse: 73  Temp: 98.7 F (37.1 C)  Resp: 16     Awake alert and oriented Right upper extremity just doing with small spot of blood otherwise clean and intact Wiggles fingers, distally NVI Fires deltoid Drain removed today   Labs:   Recent Labs  11/10/12 0634  HGB 12.7*   Recent Labs  11/10/12 0634  WBC 13.6*  RBC 4.56  HCT 37.2*  PLT 167   Recent Labs  11/10/12 0634  NA 135  K 3.5  CL 101  CO2 24  BUN 13  CREATININE 0.81  GLUCOSE 166*  CALCIUM 8.9    Assessment and Plan: 1 day s/p right shoulder hemiarthrosplasty Pain is well-controlled, continue current pain management Will work with occupational therapy today end hand wrist elbow exercises and passive range of motion limited to 30 external rotation and 130 forward flexion Okay to discharge him today after OT Percocet for home pain control, scripts and chart    VTE proph: ASA 325 mg twice a day, SCDs

## 2012-11-11 ENCOUNTER — Encounter (HOSPITAL_COMMUNITY): Payer: Self-pay | Admitting: Orthopedic Surgery

## 2012-11-11 DIAGNOSIS — M19019 Primary osteoarthritis, unspecified shoulder: Secondary | ICD-10-CM

## 2012-11-11 HISTORY — DX: Primary osteoarthritis, unspecified shoulder: M19.019

## 2012-11-11 NOTE — Discharge Summary (Signed)
Patient ID: KEESHAWN FAKHOURI MRN: [id number] DOB/AGE: [birth year] 37 y.o.  Admit date: 11/09/2012 Discharge date: 11/11/2012  Admission Diagnoses:  Principal Problem:   Shoulder arthritis   Discharge Diagnoses:  Same  Past Medical History  Diagnosis Date  . History of chickenpox     Surgeries: Procedure(s): RIGHT SHOULDER HEMI-ARTHROPLASTY on 11/09/2012   Consultants:    Discharged Condition: Improved  Hospital Course: HUSTON STONEHOCKER is an 37 y.o. male who was admitted 11/09/2012 for operative treatment ofShoulder arthritis. 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 He has small posterior glenoid defect. Pain and dysfunction interfered with quality of life. He has failed conservative management and wished to go forward with an arthroplasty procedure to decrease pain and increase function.Patient has severe unremitting pain that affects sleep, daily activities, and work/hobbies. After pre-op clearance the patient was taken to the operating room on 11/09/2012 and underwent  Procedure(s): RIGHT SHOULDER HEMI-ARTHROPLASTY.    Patient was given perioperative antibiotics: Anti-infectives   Start     Dose/Rate Route Frequency Ordered Stop   11/09/12 1500  ceFAZolin (ANCEF) IVPB 2 g/50 mL premix     2 g 100 mL/hr over 30 Minutes Intravenous Every 6 hours 11/09/12 1350 11/10/12 0358   11/09/12 0600  ceFAZolin (ANCEF) IVPB 2 g/50 mL premix     2 g 100 mL/hr over 30 Minutes Intravenous On call to O.R. 11/08/12 1441 11/09/12 0805       Patient was given sequential compression devices, early ambulation, and ASA 325mg  BID to prevent DVT.  Patient benefited maximally from hospital stay and there were no complications.    Recent vital signs: No data found.    Recent laboratory studies:  Recent Labs  11/10/12 0634  WBC 13.6*  HGB 12.7*  HCT 37.2*  PLT 167  NA 135  K 3.5  CL 101  CO2 24  BUN 13  CREATININE 0.81  GLUCOSE 166*  CALCIUM 8.9     Discharge Medications:      Medication List         oxyCODONE-acetaminophen 5-325 MG per tablet  Commonly known as:  ROXICET  Take 1-2 tablets by mouth every 4 (four) hours as needed for pain.        Diagnostic Studies: Dg Chest 2 View  11/03/2012   *RADIOLOGY REPORT*  Clinical Data: Preoperative respiratory evaluation.  CHEST - 2 VIEW  Comparison: 02/07/2010  Findings: The lungs are clear without focal infiltrate, edema, pneumothorax or pleural effusion. Cardiopericardial silhouette is at upper limits of normal for size. Imaged bony structures of the thorax are intact.  IMPRESSION: No acute cardiopulmonary process.   Original Report Authenticated By: Kennith Center, M.D.   Dg Shoulder Right Port  11/09/2012   *RADIOLOGY REPORT*  Clinical Data: Post right shoulder hemiarthroplasty  PORTABLE RIGHT SHOULDER - 2+ VIEW  Comparison: Portable exam 1139 hours compared to CT right shoulder 07/23/2012  Findings: Right humeral prosthesis identified, normally aligned with the glenohumeral joint on single AP view. Minimal lucency is seen deep to the articular portion of the component. No fracture identified. AC joint alignment normal. Osseous mineralization normal. Visualized right ribs intact.  IMPRESSION: Right humeral head prosthesis as above.   Original Report Authenticated By: Ulyses Southward, M.D.    Disposition: 01-Home or Self Care      Discharge Orders   Future Orders Complete By Expires     Call MD / Call 911  As directed     Comments:  If you experience chest pain or shortness of breath, CALL 911 and be transported to the hospital emergency room.  If you develope a fever above 101 F, pus (white drainage) or increased drainage or redness at the wound, or calf pain, call your surgeon's office.    Constipation Prevention  As directed     Comments:      Drink plenty of fluids.  Prune juice may be helpful.  You may use a stool softener, such as Colace (over the counter) 100 mg twice a day.  Use MiraLax (over the  counter) for constipation as needed.    Diet - low sodium heart healthy  As directed     Driving restrictions  As directed     Comments:      No driving until cleared by physician.    Increase activity slowly as tolerated  As directed     Lifting restrictions  As directed     Comments:      No lifting until cleared by physician.       Follow-up Information   Follow up with Mable Paris, MD. Schedule an appointment as soon as possible for a visit in 2 weeks.   Contact information:   [address] [place] [address] [phone]        Signed: Jiles Harold 11/11/2012, 6:17 PM

## 2014-01-11 IMAGING — CT CT SHOULDER*R* W/O CM
2 of 3 series · 7 of 14 positions shown, 8 images · non-contrast
Comparison: MRI dated 07/09/2012

***ADDENDUM*** CREATED: 07/29/2012 [DATE]

3-DIMENSIONAL CT IMAGE RENDERING AT INDEPENDENT WORKSTATION:
TECHNIQUE: 3-dimensional CT images were rendered by post-
processing of the original CT data at independent workstation.  The
3-dimensional CT images were interpreted, and findings were
reported in the accompanying complete CT report for this study.
CLINICAL DATA: Right shoulder pain with decreased range of motion.
CT OF THE RIGHT SHOULDER WITHOUT CONTRAST
TECHNIQUE: Multidetector CT imaging was performed according to the
standard protocol. Multiplanar CT image reconstructions were also
generated.

[Series 3: shoulder/standard · axial · 0.51mm/px · z∈[-92,-7]mm · 3 of 68 slices shown]
[im 17/68  soft-tissue]
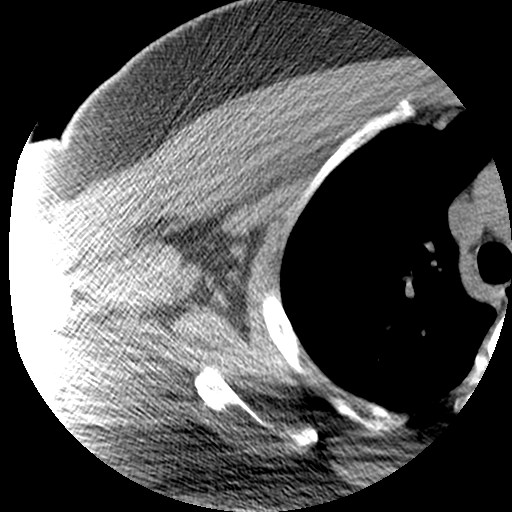
[im 34/68  soft-tissue]
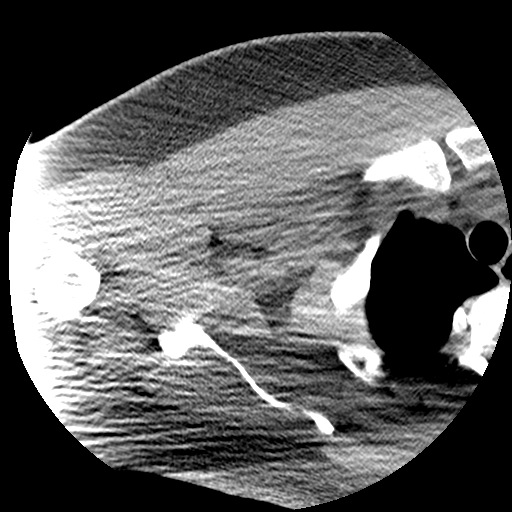
[im 51/68  soft-tissue]
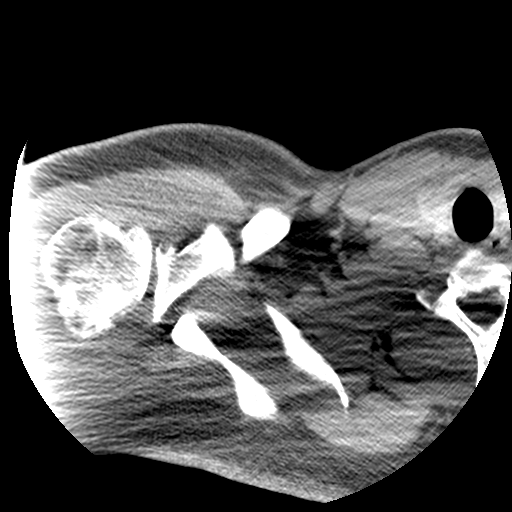

[Series 401: sag · sagittal · 0.51mm/px · 4 of 101 slices shown, 5 images]
[im 21/101  soft-tissue]
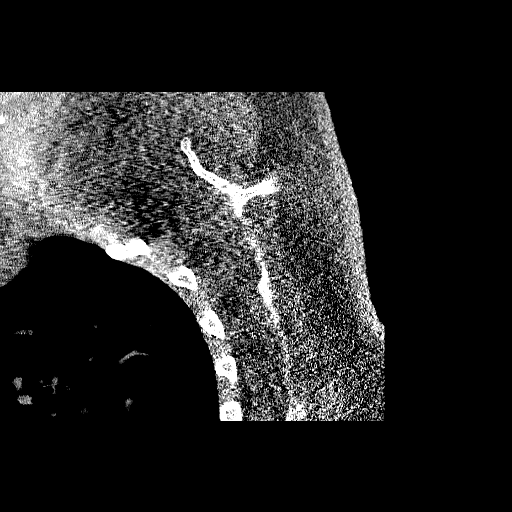
[im 21/101  bone]
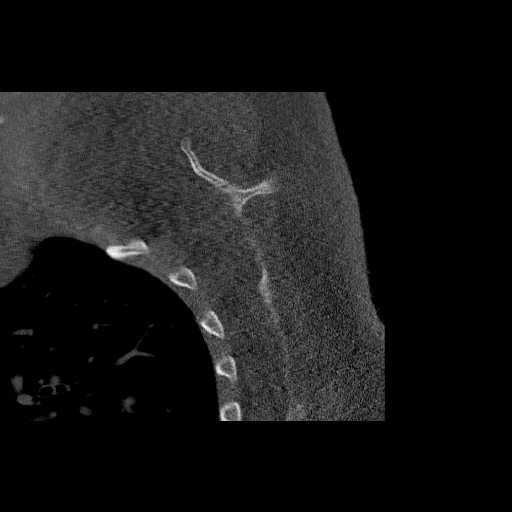
[im 41/101  bone]
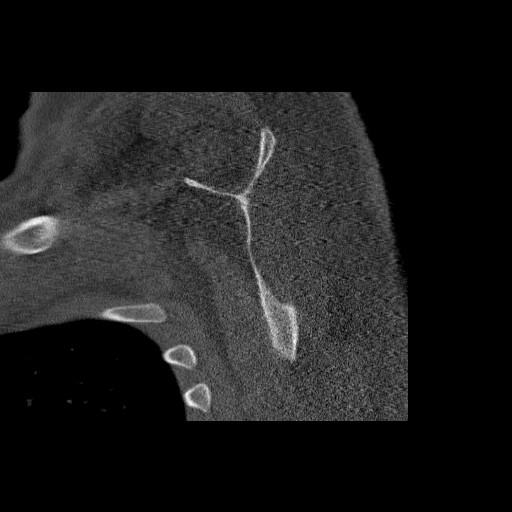
[im 61/101  bone]
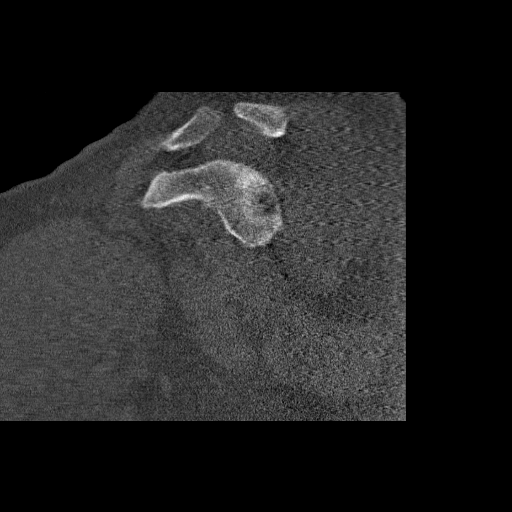
[im 81/101  bone]
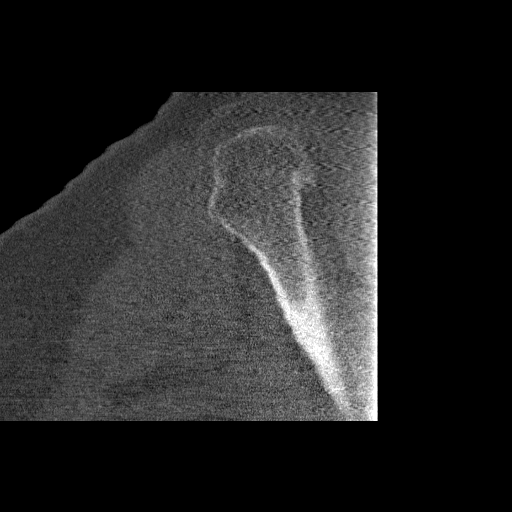

[7 of 14 positions shown; findings below may reference images not displayed]

Unfortunately, because of patient size and artifact, the 3D Data is
prone to scatter. Extensive manual manipulation was required.

The reconstructions were performed at the request of the referring
physician Dr. Juzryll with the humerus subtracted.  The order will
be associated with the primary report and this addendum by [HOSPITAL].

Three dimensional reconstructions were performed by the radiologist
at an independent workstation.

***END ADDENDUM*** SIGNED BY: Gulma Kademi, M.D.
FINDINGS: There is severe arthritis of the glenohumeral joint with
deformity of the humeral head with large marginal spurs on the
inferior and posterior aspects of the head.  There is a 2 cm
diameter erosion of the posterior aspect of the glenoid with
osteophytes on the anterior rim of the glenoid.

There is an ununited os acromiale.  Type 3 acromion.  No
degenerative changes of the AC joint.
IMPRESSION: Severe arthritis of the glenohumeral joint as described.

## 2014-04-30 IMAGING — CR DG SHOULDER 2+V PORT*R*
1 series · 1 of 1 positions shown · non-contrast
Comparison: Portable exam 7718 hours compared to CT right shoulder
07/23/2012

CLINICAL DATA: Post right shoulder hemiarthroplasty

PORTABLE RIGHT SHOULDER - 2+ VIEW

[AP]
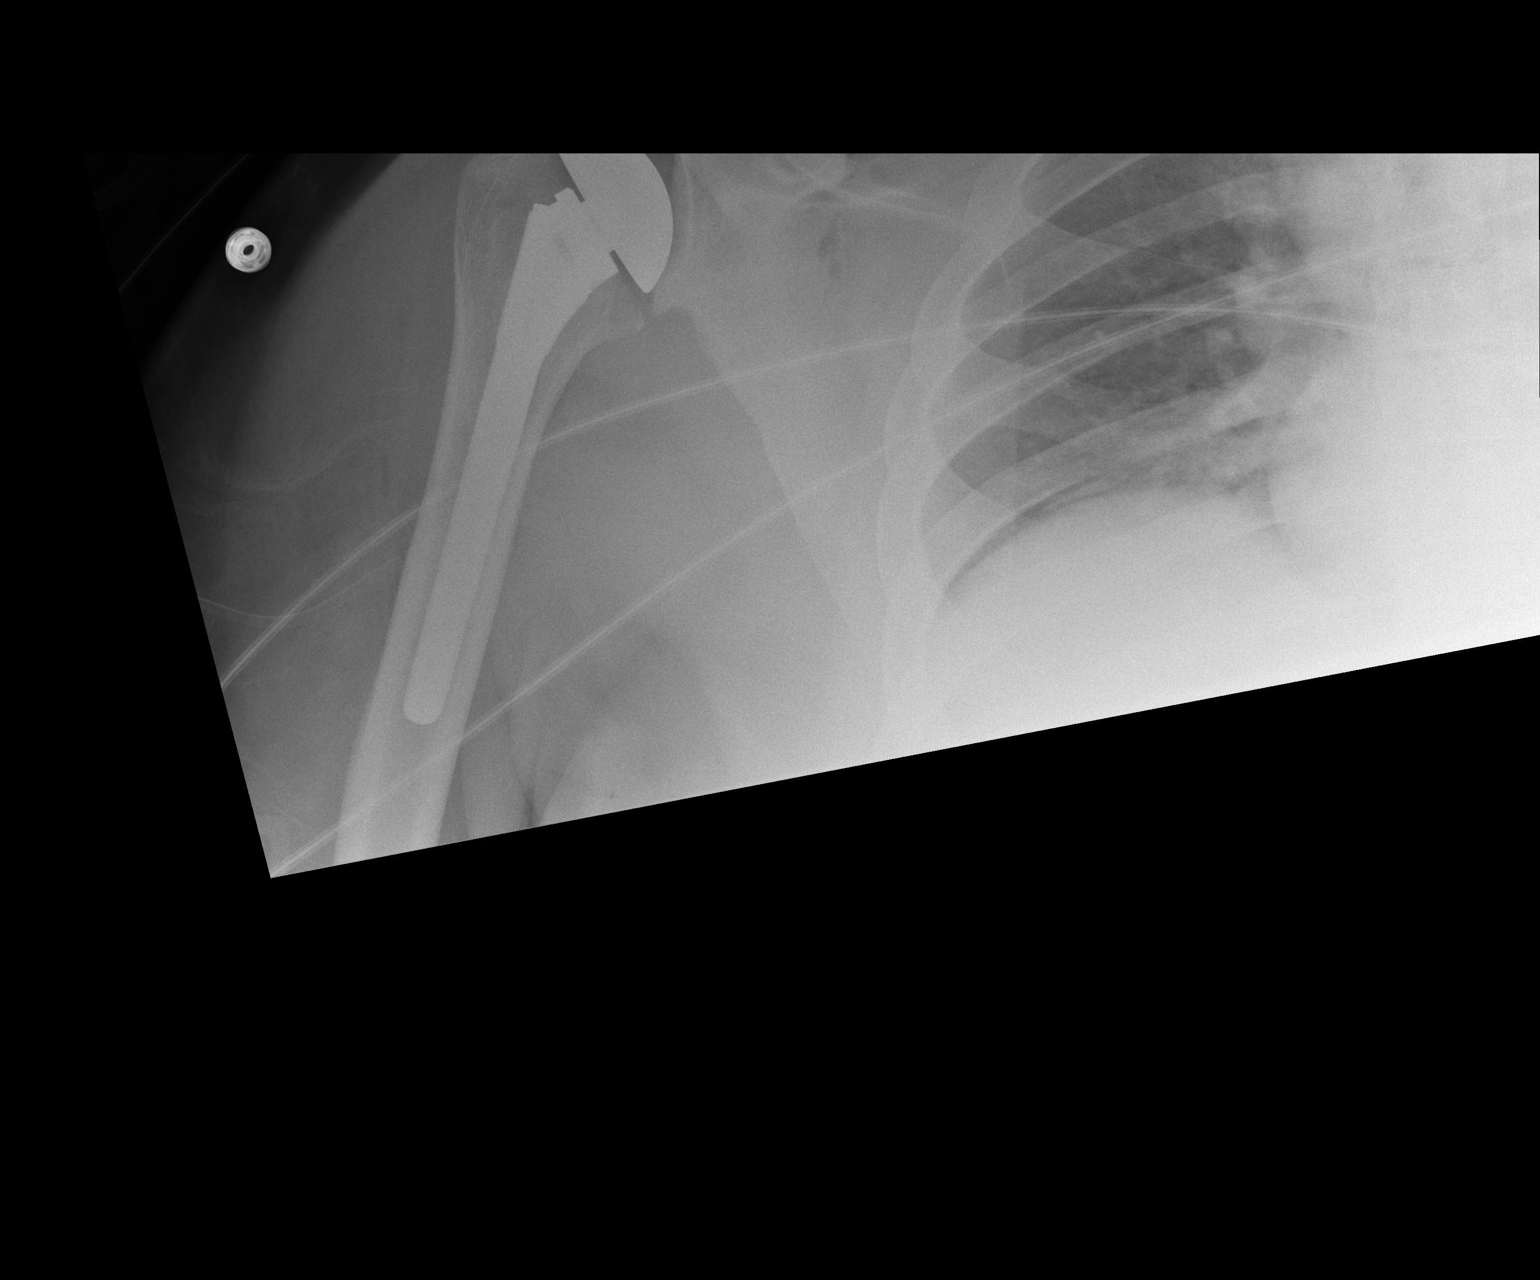

[1 of 1 positions shown; findings below may reference images not displayed]

FINDINGS: Right humeral prosthesis identified, normally aligned with the
glenohumeral joint on single AP view.
Minimal lucency is seen deep to the articular portion of the
component.
No fracture identified.
AC joint alignment normal.
Osseous mineralization normal.
Visualized right ribs intact.
IMPRESSION: Right humeral head prosthesis as above.

## 2016-08-11 ENCOUNTER — Encounter (HOSPITAL_COMMUNITY): Payer: Self-pay | Admitting: *Deleted

## 2016-08-11 ENCOUNTER — Other Ambulatory Visit: Payer: Self-pay | Admitting: Orthopedic Surgery

## 2016-08-11 ENCOUNTER — Ambulatory Visit: Payer: Self-pay | Admitting: Orthopedic Surgery

## 2016-08-11 NOTE — Patient Instructions (Addendum)
AGAMJOT KILGALLON  08/11/2016   Your procedure is scheduled on: 08-14-16  Report to [facility]  Entrance take [facility]  elevators to 3rd floor to  Short Stay Center at 2:15PM.  Call this number if you have problems the morning of surgery [phone]   Remember: ONLY 1 PERSON MAY GO WITH YOU TO SHORT STAY TO GET  READY MORNING OF YOUR SURGERY.  Do not eat food After Midnight. YOU MAY HAVE CLEAR LIQUIDS FROM MIDNIGHT UNTIL 10:15AM DAY OF SURGERY. NOTHING BY MOUTH AFTER 10:15AM!!     Take these medicines the morning of surgery with A SIP OF WATER: none                                 You may not have any metal on your body including hair pins and              piercings  Do not wear jewelry, make-up, lotions, powders or perfumes, deodorant           Do not shave  48 hours prior to surgery.              Men may shave face and neck.   Do not bring valuables to the hospital. [facility] IS NOT             RESPONSIBLE   FOR VALUABLES.  Contacts, dentures or bridgework may not be worn into surgery.      Patients discharged the day of surgery will not be allowed to drive home.  Name and phone number of your driver:  Special Instructions: N/A              Please read over the following fact sheets you were given: _____________________________________________________________________                CLEAR LIQUID DIET   Foods Allowed                                                                     Foods Excluded  Coffee and tea, regular and decaf                             liquids that you cannot  Plain Jell-O in any flavor                                             see through such as: Fruit ices (not with fruit pulp)                                     milk, soups, orange juice  Iced Popsicles                                    All solid food  Carbonated beverages, regular and diet                                    Cranberry, grape and apple  juices Sports drinks like Gatorade Lightly seasoned clear broth or consume(fat free) Sugar, honey syrup  Sample Menu Breakfast                                Lunch                                     Supper Cranberry juice                    Beef broth                            Chicken broth Jell-O                                     Grape juice                           Apple juice Coffee or tea                        Jell-O                                      Popsicle                                                Coffee or tea                        Coffee or tea  _____________________________________________________________________  [facility] - Preparing for Surgery Before surgery, you can play an important role.  Because skin is not sterile, your skin needs to be as free of germs as possible.  You can reduce the number of germs on your skin by washing with CHG (chlorahexidine gluconate) soap before surgery.  CHG is an antiseptic cleaner which kills germs and bonds with the skin to continue killing germs even after washing. Please DO NOT use if you have an allergy to CHG or antibacterial soaps.  If your skin becomes reddened/irritated stop using the CHG and inform your nurse when you arrive at Short Stay. Do not shave (including legs and underarms) for at least 48 hours prior to the first CHG shower.  You may shave your face/neck. Please follow these instructions carefully:  1.  Shower with CHG Soap the night before surgery and the  morning of Surgery.  2.  If you choose to wash your hair, wash your hair first as usual with your  normal  shampoo.  3.  After you shampoo, rinse your hair and body thoroughly to remove the  shampoo.  4.  Use CHG as you would any other liquid soap.  You can apply chg directly  to the skin and wash                       Gently with a scrungie or clean washcloth.  5.  Apply the CHG Soap to your body ONLY FROM THE NECK DOWN.   Do not  use on face/ open                           Wound or open sores. Avoid contact with eyes, ears mouth and genitals (private parts).                       Wash face,  Genitals (private parts) with your normal soap.             6.  Wash thoroughly, paying special attention to the area where your surgery  will be performed.  7.  Thoroughly rinse your body with warm water from the neck down.  8.  DO NOT shower/wash with your normal soap after using and rinsing off  the CHG Soap.                9.  Pat yourself dry with a clean towel.            10.  Wear clean pajamas.            11.  Place clean sheets on your bed the night of your first shower and do not  sleep with pets. Day of Surgery : Do not apply any lotions/deodorants the morning of surgery.  Please wear clean clothes to the hospital/surgery center.  FAILURE TO FOLLOW THESE INSTRUCTIONS MAY RESULT IN THE CANCELLATION OF YOUR SURGERY PATIENT SIGNATURE_________________________________  NURSE SIGNATURE__________________________________  ________________________________________________________________________   Adam Phenix  An incentive spirometer is a tool that can help keep your lungs clear and active. This tool measures how well you are filling your lungs with each breath. Taking long deep breaths may help reverse or decrease the chance of developing breathing (pulmonary) problems (especially infection) following:  A long period of time when you are unable to move or be active. BEFORE THE PROCEDURE   If the spirometer includes an indicator to show your best effort, your nurse or respiratory therapist will set it to a desired goal.  If possible, sit up straight or lean slightly forward. Try not to slouch.  Hold the incentive spirometer in an upright position. INSTRUCTIONS FOR USE  1. Sit on the edge of your bed if possible, or sit up as far as you can in bed or on a chair. 2. Hold the incentive spirometer in an upright  position. 3. Breathe out normally. 4. Place the mouthpiece in your mouth and seal your lips tightly around it. 5. Breathe in slowly and as deeply as possible, raising the piston or the ball toward the top of the column. 6. Hold your breath for 3-5 seconds or for as long as possible. Allow the piston or ball to fall to the bottom of the column. 7. Remove the mouthpiece from your mouth and breathe out normally. 8. Rest for a few seconds and repeat Steps 1 through 7 at least 10 times every 1-2 hours when you are awake. Take your time and take a few normal breaths between deep breaths. 9. The spirometer may include an indicator to  show your best effort. Use the indicator as a goal to work toward during each repetition. 10. After each set of 10 deep breaths, practice coughing to be sure your lungs are clear. If you have an incision (the cut made at the time of surgery), support your incision when coughing by placing a pillow or rolled up towels firmly against it. Once you are able to get out of bed, walk around indoors and cough well. You may stop using the incentive spirometer when instructed by your caregiver.  RISKS AND COMPLICATIONS  Take your time so you do not get dizzy or light-headed.  If you are in pain, you may need to take or ask for pain medication before doing incentive spirometry. It is harder to take a deep breath if you are having pain. AFTER USE  Rest and breathe slowly and easily.  It can be helpful to keep track of a log of your progress. Your caregiver can provide you with a simple table to help with this. If you are using the spirometer at home, follow these instructions: [place] IF:   You are having difficultly using the spirometer.  You have trouble using the spirometer as often as instructed.  Your pain medication is not giving enough relief while using the spirometer.  You develop fever of 100.5 F (38.1 C) or higher. SEEK IMMEDIATE MEDICAL CARE IF:    You cough up bloody sputum that had not been present before.  You develop fever of 102 F (38.9 C) or greater.  You develop worsening pain at or near the incision site. MAKE SURE YOU:   Understand these instructions.  Will watch your condition.  Will get help right away if you are not doing well or get worse. Document Released: 09/08/2006 Document Revised: 07/21/2011 Document Reviewed: 11/09/2006 [facility] Patient Information 2014 [place], [place].   ________________________________________________________________________

## 2016-08-12 ENCOUNTER — Encounter (HOSPITAL_COMMUNITY): Payer: Self-pay

## 2016-08-12 ENCOUNTER — Encounter (HOSPITAL_COMMUNITY)
Admission: RE | Admit: 2016-08-12 | Discharge: 2016-08-12 | Disposition: A | Discharge status: Home / Self Care | Payer: 59 | Source: Ambulatory Visit | Attending: Orthopedic Surgery | Admitting: Orthopedic Surgery

## 2016-08-12 DIAGNOSIS — Z800 Family history of malignant neoplasm of digestive organs: Secondary | ICD-10-CM | POA: Diagnosis not present

## 2016-08-12 DIAGNOSIS — S76111A Strain of right quadriceps muscle, fascia and tendon, initial encounter: Secondary | ICD-10-CM

## 2016-08-12 DIAGNOSIS — Z8052 Family history of malignant neoplasm of bladder: Secondary | ICD-10-CM | POA: Diagnosis not present

## 2016-08-12 DIAGNOSIS — X58XXXA Exposure to other specified factors, initial encounter: Secondary | ICD-10-CM | POA: Insufficient documentation

## 2016-08-12 DIAGNOSIS — Z79899 Other long term (current) drug therapy: Secondary | ICD-10-CM | POA: Diagnosis not present

## 2016-08-12 DIAGNOSIS — Z8041 Family history of malignant neoplasm of ovary: Secondary | ICD-10-CM | POA: Diagnosis not present

## 2016-08-12 DIAGNOSIS — Z811 Family history of alcohol abuse and dependence: Secondary | ICD-10-CM | POA: Diagnosis not present

## 2016-08-12 DIAGNOSIS — Z818 Family history of other mental and behavioral disorders: Secondary | ICD-10-CM | POA: Diagnosis not present

## 2016-08-12 DIAGNOSIS — Y92838 Other recreation area as the place of occurrence of the external cause: Secondary | ICD-10-CM | POA: Diagnosis not present

## 2016-08-12 DIAGNOSIS — Z7982 Long term (current) use of aspirin: Secondary | ICD-10-CM | POA: Diagnosis not present

## 2016-08-12 DIAGNOSIS — Z6841 Body Mass Index (BMI) 40.0 and over, adult: Secondary | ICD-10-CM | POA: Diagnosis not present

## 2016-08-12 DIAGNOSIS — Z01812 Encounter for preprocedural laboratory examination: Secondary | ICD-10-CM | POA: Insufficient documentation

## 2016-08-12 DIAGNOSIS — Z8619 Personal history of other infectious and parasitic diseases: Secondary | ICD-10-CM | POA: Diagnosis not present

## 2016-08-12 DIAGNOSIS — Y9373 Activity, racquet and hand sports: Secondary | ICD-10-CM | POA: Diagnosis not present

## 2016-08-12 LAB — CBC
HEMATOCRIT: 43.2 % (ref 39.0–52.0)
HEMOGLOBIN: 14.6 g/dL (ref 13.0–17.0)
MCH: 27.9 pg (ref 26.0–34.0)
MCHC: 33.8 g/dL (ref 30.0–36.0)
MCV: 82.6 fL (ref 78.0–100.0)
Platelets: 216 10*3/uL (ref 150–400)
RBC: 5.23 MIL/uL (ref 4.22–5.81)
RDW: 13.7 % (ref 11.5–15.5)
WBC: 8.7 10*3/uL (ref 4.0–10.5)

## 2016-08-13 MED ORDER — DEXTROSE 5 % IV SOLN
3.0000 g | INTRAVENOUS | Status: AC
  Administered 2016-08-14: 3 g via INTRAVENOUS
  Filled 2016-08-13: qty 3000

## 2016-08-14 ENCOUNTER — Ambulatory Visit (HOSPITAL_COMMUNITY): Payer: 59 | Admitting: Certified Registered Nurse Anesthetist

## 2016-08-14 ENCOUNTER — Encounter (HOSPITAL_COMMUNITY)
Admission: RE | Disposition: A | Discharge status: Home / Self Care | Payer: Self-pay | Source: Ambulatory Visit | Attending: Orthopedic Surgery

## 2016-08-14 ENCOUNTER — Encounter (HOSPITAL_COMMUNITY): Payer: Self-pay | Admitting: *Deleted

## 2016-08-14 ENCOUNTER — Observation Stay (HOSPITAL_COMMUNITY)
Admission: RE | Admit: 2016-08-14 | Discharge: 2016-08-15 | Disposition: A | Discharge status: Home / Self Care | Payer: 59 | Source: Ambulatory Visit | Attending: Orthopedic Surgery | Admitting: Orthopedic Surgery

## 2016-08-14 DIAGNOSIS — Z8619 Personal history of other infectious and parasitic diseases: Secondary | ICD-10-CM | POA: Insufficient documentation

## 2016-08-14 DIAGNOSIS — Z79899 Other long term (current) drug therapy: Secondary | ICD-10-CM | POA: Insufficient documentation

## 2016-08-14 DIAGNOSIS — Y9373 Activity, racquet and hand sports: Secondary | ICD-10-CM | POA: Insufficient documentation

## 2016-08-14 DIAGNOSIS — Z6841 Body Mass Index (BMI) 40.0 and over, adult: Secondary | ICD-10-CM | POA: Insufficient documentation

## 2016-08-14 DIAGNOSIS — Z8041 Family history of malignant neoplasm of ovary: Secondary | ICD-10-CM | POA: Insufficient documentation

## 2016-08-14 DIAGNOSIS — S76111A Strain of right quadriceps muscle, fascia and tendon, initial encounter: Secondary | ICD-10-CM

## 2016-08-14 DIAGNOSIS — Z800 Family history of malignant neoplasm of digestive organs: Secondary | ICD-10-CM | POA: Insufficient documentation

## 2016-08-14 DIAGNOSIS — Z8052 Family history of malignant neoplasm of bladder: Secondary | ICD-10-CM | POA: Insufficient documentation

## 2016-08-14 DIAGNOSIS — Z811 Family history of alcohol abuse and dependence: Secondary | ICD-10-CM | POA: Insufficient documentation

## 2016-08-14 DIAGNOSIS — Y92838 Other recreation area as the place of occurrence of the external cause: Secondary | ICD-10-CM | POA: Insufficient documentation

## 2016-08-14 DIAGNOSIS — Z818 Family history of other mental and behavioral disorders: Secondary | ICD-10-CM | POA: Insufficient documentation

## 2016-08-14 DIAGNOSIS — Z7982 Long term (current) use of aspirin: Secondary | ICD-10-CM | POA: Insufficient documentation

## 2016-08-14 HISTORY — PX: QUADRICEPS TENDON REPAIR: SHX756

## 2016-08-14 HISTORY — DX: Strain of right quadriceps muscle, fascia and tendon, initial encounter: S76.111A

## 2016-08-14 LAB — CREATININE, SERUM
CREATININE: 1.1 mg/dL (ref 0.61–1.24)
GFR calc Af Amer: >60 mL/min (ref >60)
GFR calc non Af Amer: >60 mL/min (ref >60)

## 2016-08-14 LAB — CBC
HCT: 43 % (ref 39.0–52.0)
HEMOGLOBIN: 14.9 g/dL (ref 13.0–17.0)
MCH: 28.5 pg (ref 26.0–34.0)
MCHC: 34.7 g/dL (ref 30.0–36.0)
MCV: 82.4 fL (ref 78.0–100.0)
PLATELETS: 220 10*3/uL (ref 150–400)
RBC: 5.22 MIL/uL (ref 4.22–5.81)
RDW: 13.6 % (ref 11.5–15.5)
WBC: 11.9 10*3/uL — AB (ref 4.0–10.5)

## 2016-08-14 SURGERY — REPAIR, TENDON, QUADRICEPS
Anesthesia: General | Laterality: Right

## 2016-08-14 MED ORDER — HYDROMORPHONE HCL 1 MG/ML IJ SOLN
INTRAMUSCULAR | Status: AC
  Filled 2016-08-14: qty 1

## 2016-08-14 MED ORDER — ACETAMINOPHEN 650 MG RE SUPP: 650.0000 mg | Freq: Four times a day (QID) | RECTAL | Status: DC | PRN

## 2016-08-14 MED ORDER — FENTANYL CITRATE (PF) 250 MCG/5ML IJ SOLN
INTRAMUSCULAR | Status: AC
  Filled 2016-08-14: qty 5

## 2016-08-14 MED ORDER — OXYCODONE HCL 5 MG PO TABS: 5.0000 mg | ORAL_TABLET | ORAL | Status: DC | PRN

## 2016-08-14 MED ORDER — DOCUSATE SODIUM 100 MG PO CAPS
100.0000 mg | ORAL_CAPSULE | Freq: Two times a day (BID) | ORAL | Status: DC
  Administered 2016-08-14 – 2016-08-15 (×2): 100 mg via ORAL
  Filled 2016-08-14 (×2): qty 1

## 2016-08-14 MED ORDER — ONDANSETRON HCL 4 MG/2ML IJ SOLN
INTRAMUSCULAR | Status: DC | PRN
  Administered 2016-08-14: 4 mg via INTRAVENOUS

## 2016-08-14 MED ORDER — SENNA 8.6 MG PO TABS: 2.0000 | ORAL_TABLET | Freq: Every day | ORAL | 3 refills | Status: DC

## 2016-08-14 MED ORDER — HYDROMORPHONE HCL 1 MG/ML IJ SOLN: 0.5000 mg | INTRAMUSCULAR | Status: DC | PRN

## 2016-08-14 MED ORDER — ONDANSETRON HCL 4 MG/2ML IJ SOLN
INTRAMUSCULAR | Status: AC
Start: 2016-08-14 — End: 2016-08-14
  Filled 2016-08-14: qty 2

## 2016-08-14 MED ORDER — SODIUM CHLORIDE 0.9 % IJ SOLN
INTRAMUSCULAR | Status: AC
  Filled 2016-08-14: qty 50

## 2016-08-14 MED ORDER — ACETAMINOPHEN 325 MG PO TABS: 650.0000 mg | ORAL_TABLET | Freq: Four times a day (QID) | ORAL | Status: DC | PRN

## 2016-08-14 MED ORDER — ONDANSETRON HCL 4 MG PO TABS: 4.0000 mg | ORAL_TABLET | Freq: Three times a day (TID) | ORAL | 0 refills | Status: DC | PRN

## 2016-08-14 MED ORDER — ACETAMINOPHEN 10 MG/ML IV SOLN
INTRAVENOUS | Status: AC
  Filled 2016-08-14: qty 100

## 2016-08-14 MED ORDER — BUPIVACAINE HCL (PF) 0.25 % IJ SOLN
INTRAMUSCULAR | Status: DC | PRN
  Administered 2016-08-14: 30 mL

## 2016-08-14 MED ORDER — MIDAZOLAM HCL 5 MG/5ML IJ SOLN
INTRAMUSCULAR | Status: DC | PRN
  Administered 2016-08-14: 2 mg via INTRAVENOUS

## 2016-08-14 MED ORDER — METOCLOPRAMIDE HCL 5 MG PO TABS
5.0000 mg | ORAL_TABLET | Freq: Three times a day (TID) | ORAL | Status: DC | PRN
  Filled 2016-08-14: qty 2

## 2016-08-14 MED ORDER — PROPOFOL 10 MG/ML IV BOLUS
INTRAVENOUS | Status: AC
  Filled 2016-08-14: qty 40

## 2016-08-14 MED ORDER — DOCUSATE SODIUM 100 MG PO CAPS: 100.0000 mg | ORAL_CAPSULE | Freq: Two times a day (BID) | ORAL | 3 refills | Status: DC

## 2016-08-14 MED ORDER — MIDAZOLAM HCL 2 MG/2ML IJ SOLN: 0.5000 mg | Freq: Once | INTRAMUSCULAR | Status: DC | PRN

## 2016-08-14 MED ORDER — ENOXAPARIN SODIUM 40 MG/0.4ML ~~LOC~~ SOLN
40.0000 mg | SUBCUTANEOUS | Status: DC
  Administered 2016-08-15: 09:00:00 40 mg via SUBCUTANEOUS
  Filled 2016-08-14: qty 0.4

## 2016-08-14 MED ORDER — ISOPROPYL ALCOHOL 70 % SOLN
Status: AC
  Filled 2016-08-14: qty 480

## 2016-08-14 MED ORDER — LIDOCAINE 2% (20 MG/ML) 5 ML SYRINGE
INTRAMUSCULAR | Status: AC
  Filled 2016-08-14: qty 5

## 2016-08-14 MED ORDER — FENTANYL CITRATE (PF) 100 MCG/2ML IJ SOLN
INTRAMUSCULAR | Status: AC
  Filled 2016-08-14: qty 2

## 2016-08-14 MED ORDER — KETOROLAC TROMETHAMINE 15 MG/ML IJ SOLN
15.0000 mg | Freq: Four times a day (QID) | INTRAMUSCULAR | Status: DC
  Administered 2016-08-15 (×2): 15 mg via INTRAVENOUS
  Filled 2016-08-14 (×2): qty 1

## 2016-08-14 MED ORDER — SUGAMMADEX SODIUM 200 MG/2ML IV SOLN
INTRAVENOUS | Status: DC | PRN
  Administered 2016-08-14: 400 mg via INTRAVENOUS

## 2016-08-14 MED ORDER — KETOROLAC TROMETHAMINE 30 MG/ML IJ SOLN
30.0000 mg | Freq: Once | INTRAMUSCULAR | Status: AC
  Administered 2016-08-14: 30 mg via INTRAVENOUS

## 2016-08-14 MED ORDER — KETOROLAC TROMETHAMINE 30 MG/ML IJ SOLN
INTRAMUSCULAR | Status: AC
  Filled 2016-08-14: qty 1

## 2016-08-14 MED ORDER — LIDOCAINE 2% (20 MG/ML) 5 ML SYRINGE
INTRAMUSCULAR | Status: DC | PRN
  Administered 2016-08-14: 100 mg via INTRAVENOUS

## 2016-08-14 MED ORDER — MIDAZOLAM HCL 2 MG/2ML IJ SOLN
INTRAMUSCULAR | Status: AC
  Filled 2016-08-14: qty 2

## 2016-08-14 MED ORDER — METHOCARBAMOL 500 MG PO TABS: 500.0000 mg | ORAL_TABLET | Freq: Four times a day (QID) | ORAL | Status: DC | PRN

## 2016-08-14 MED ORDER — FENTANYL CITRATE (PF) 100 MCG/2ML IJ SOLN
INTRAMUSCULAR | Status: DC | PRN
  Administered 2016-08-14 (×2): 50 ug via INTRAVENOUS
  Administered 2016-08-14: 100 ug via INTRAVENOUS
  Administered 2016-08-14: 50 ug via INTRAVENOUS

## 2016-08-14 MED ORDER — SUGAMMADEX SODIUM 500 MG/5ML IV SOLN
INTRAVENOUS | Status: AC
  Filled 2016-08-14: qty 5

## 2016-08-14 MED ORDER — SUGAMMADEX SODIUM 200 MG/2ML IV SOLN
INTRAVENOUS | Status: AC
  Filled 2016-08-14: qty 2

## 2016-08-14 MED ORDER — POLYETHYLENE GLYCOL 3350 17 G PO PACK: 17.0000 g | PACK | Freq: Every day | ORAL | Status: DC | PRN

## 2016-08-14 MED ORDER — PROMETHAZINE HCL 25 MG/ML IJ SOLN: 6.2500 mg | INTRAMUSCULAR | Status: DC | PRN

## 2016-08-14 MED ORDER — SUCCINYLCHOLINE CHLORIDE 200 MG/10ML IV SOSY
PREFILLED_SYRINGE | INTRAVENOUS | Status: DC | PRN
  Administered 2016-08-14: 150 mg via INTRAVENOUS

## 2016-08-14 MED ORDER — BUPIVACAINE-EPINEPHRINE (PF) 0.5% -1:200000 IJ SOLN
INTRAMUSCULAR | Status: DC | PRN
  Administered 2016-08-14: 30 mL via PERINEURAL

## 2016-08-14 MED ORDER — ACETAMINOPHEN 10 MG/ML IV SOLN: 1000.0000 mg | Freq: Once | INTRAVENOUS | Status: DC

## 2016-08-14 MED ORDER — DEXAMETHASONE SODIUM PHOSPHATE 10 MG/ML IJ SOLN
INTRAMUSCULAR | Status: DC | PRN
  Administered 2016-08-14: 10 mg via INTRAVENOUS

## 2016-08-14 MED ORDER — PROPOFOL 10 MG/ML IV BOLUS
INTRAVENOUS | Status: DC | PRN
  Administered 2016-08-14: 270 mg via INTRAVENOUS

## 2016-08-14 MED ORDER — METHOCARBAMOL 1000 MG/10ML IJ SOLN
500.0000 mg | Freq: Four times a day (QID) | INTRAVENOUS | Status: DC | PRN
  Administered 2016-08-14: 500 mg via INTRAVENOUS
  Filled 2016-08-14: qty 550
  Filled 2016-08-14: qty 5

## 2016-08-14 MED ORDER — CHLORHEXIDINE GLUCONATE 4 % EX LIQD: 60.0000 mL | Freq: Once | CUTANEOUS | Status: DC

## 2016-08-14 MED ORDER — SODIUM CHLORIDE 0.9 % IV SOLN
INTRAVENOUS | Status: DC
  Administered 2016-08-14: 22:00:00 via INTRAVENOUS

## 2016-08-14 MED ORDER — HYDROMORPHONE HCL 1 MG/ML IJ SOLN
0.5000 mg | INTRAMUSCULAR | Status: DC | PRN
  Filled 2016-08-14: qty 1

## 2016-08-14 MED ORDER — MEPERIDINE HCL 50 MG/ML IJ SOLN: 6.2500 mg | INTRAMUSCULAR | Status: DC | PRN

## 2016-08-14 MED ORDER — BUPIVACAINE HCL (PF) 0.25 % IJ SOLN
INTRAMUSCULAR | Status: AC
  Filled 2016-08-14: qty 30

## 2016-08-14 MED ORDER — CEFAZOLIN SODIUM-DEXTROSE 2-4 GM/100ML-% IV SOLN
2.0000 g | Freq: Four times a day (QID) | INTRAVENOUS | Status: AC
  Administered 2016-08-14 – 2016-08-15 (×3): 2 g via INTRAVENOUS
  Filled 2016-08-14 (×3): qty 100

## 2016-08-14 MED ORDER — ROCURONIUM BROMIDE 50 MG/5ML IV SOSY
PREFILLED_SYRINGE | INTRAVENOUS | Status: AC
  Filled 2016-08-14: qty 5

## 2016-08-14 MED ORDER — SENNA 8.6 MG PO TABS
1.0000 | ORAL_TABLET | Freq: Two times a day (BID) | ORAL | Status: DC
  Administered 2016-08-14 – 2016-08-15 (×2): 8.6 mg via ORAL
  Filled 2016-08-14 (×2): qty 1

## 2016-08-14 MED ORDER — HYDROMORPHONE HCL 1 MG/ML IJ SOLN
0.2500 mg | INTRAMUSCULAR | Status: DC | PRN
  Administered 2016-08-14 (×4): 0.5 mg via INTRAVENOUS

## 2016-08-14 MED ORDER — LACTATED RINGERS IV SOLN
INTRAVENOUS | Status: DC
  Administered 2016-08-14: 17:00:00 via INTRAVENOUS
  Administered 2016-08-14: 1000 mL via INTRAVENOUS

## 2016-08-14 MED ORDER — ROCURONIUM BROMIDE 50 MG/5ML IV SOSY
PREFILLED_SYRINGE | INTRAVENOUS | Status: DC | PRN
  Administered 2016-08-14: 50 mg via INTRAVENOUS

## 2016-08-14 MED ORDER — DIPHENHYDRAMINE HCL 12.5 MG/5ML PO ELIX: 12.5000 mg | ORAL_SOLUTION | ORAL | Status: DC | PRN

## 2016-08-14 MED ORDER — METOCLOPRAMIDE HCL 5 MG/ML IJ SOLN: 5.0000 mg | Freq: Three times a day (TID) | INTRAMUSCULAR | Status: DC | PRN

## 2016-08-14 MED ORDER — HYDROCODONE-ACETAMINOPHEN 5-325 MG PO TABS
1.0000 | ORAL_TABLET | ORAL | Status: DC | PRN
  Administered 2016-08-15: 09:00:00 1 via ORAL
  Filled 2016-08-14: qty 1

## 2016-08-14 MED ORDER — HYDROCODONE-ACETAMINOPHEN 5-325 MG PO TABS: 1.0000 | ORAL_TABLET | ORAL | 0 refills | Status: DC | PRN

## 2016-08-14 MED ORDER — ONDANSETRON HCL 4 MG/2ML IJ SOLN: 4.0000 mg | Freq: Four times a day (QID) | INTRAMUSCULAR | Status: DC | PRN

## 2016-08-14 MED ORDER — SUCCINYLCHOLINE CHLORIDE 200 MG/10ML IV SOSY
PREFILLED_SYRINGE | INTRAVENOUS | Status: AC
  Filled 2016-08-14: qty 10

## 2016-08-14 MED ORDER — ONDANSETRON HCL 4 MG PO TABS
4.0000 mg | ORAL_TABLET | Freq: Four times a day (QID) | ORAL | Status: DC | PRN
  Filled 2016-08-14: qty 1

## 2016-08-14 MED ORDER — ASPIRIN EC 325 MG PO TBEC: 325.0000 mg | DELAYED_RELEASE_TABLET | Freq: Two times a day (BID) | ORAL | 0 refills | Status: DC

## 2016-08-14 SURGICAL SUPPLY — 60 items
BAG ZIPLOCK 12X15 (MISCELLANEOUS) IMPLANT
BANDAGE ACE 6X5 VEL STRL LF (GAUZE/BANDAGES/DRESSINGS) ×3 IMPLANT
BANDAGE ESMARK 6X9 LF (GAUZE/BANDAGES/DRESSINGS) IMPLANT
BIT DRILL 2.8X128 (BIT) ×2 IMPLANT
BIT DRILL 2.8X128MM (BIT) ×1
BNDG ESMARK 6X9 LF (GAUZE/BANDAGES/DRESSINGS)
COVER SURGICAL LIGHT HANDLE (MISCELLANEOUS) ×3 IMPLANT
CUFF TOURN SGL QUICK 34 (TOURNIQUET CUFF)
CUFF TRNQT CYL 34X4X40X1 (TOURNIQUET CUFF) IMPLANT
DERMABOND ADVANCED (GAUZE/BANDAGES/DRESSINGS) ×4
DERMABOND ADVANCED .7 DNX12 (GAUZE/BANDAGES/DRESSINGS) ×2 IMPLANT
DRAPE EXTREMITY T 121X128X90 (DRAPE) ×3 IMPLANT
DRAPE U-SHAPE 47X51 STRL (DRAPES) ×3 IMPLANT
DRSG AQUACEL AG ADV 3.5X10 (GAUZE/BANDAGES/DRESSINGS) IMPLANT
DRSG TEGADERM 4X4.75 (GAUZE/BANDAGES/DRESSINGS) IMPLANT
DURAPREP 26ML APPLICATOR (WOUND CARE) ×6 IMPLANT
ELECT REM PT RETURN 15FT ADLT (MISCELLANEOUS) ×3 IMPLANT
FACESHIELD WRAPAROUND (MASK) ×6 IMPLANT
GAUZE SPONGE 2X2 8PLY STRL LF (GAUZE/BANDAGES/DRESSINGS) IMPLANT
GLOVE BIOGEL PI IND STRL 7.5 (GLOVE) ×6 IMPLANT
GLOVE BIOGEL PI IND STRL 8.5 (GLOVE) IMPLANT
GLOVE BIOGEL PI INDICATOR 7.5 (GLOVE) ×12
GLOVE BIOGEL PI INDICATOR 8.5 (GLOVE)
GLOVE ECLIPSE 8.0 STRL XLNG CF (GLOVE) IMPLANT
GLOVE ORTHO TXT STRL SZ7.5 (GLOVE) ×6 IMPLANT
GLOVE SURG ORTHO 8.0 STRL STRW (GLOVE) ×3 IMPLANT
GOWN SPEC L3 XXLG W/TWL (GOWN DISPOSABLE) IMPLANT
GOWN STRL REUS W/TWL LRG LVL3 (GOWN DISPOSABLE) ×9 IMPLANT
IMMOBILIZER KNEE 20 (SOFTGOODS)
IMMOBILIZER KNEE 20 THIGH 36 (SOFTGOODS) IMPLANT
IMMOBILIZER KNEE 22 UNIV (SOFTGOODS) ×3 IMPLANT
KIT BASIN OR (CUSTOM PROCEDURE TRAY) ×3 IMPLANT
MANIFOLD NEPTUNE II (INSTRUMENTS) ×3 IMPLANT
NDL SAFETY ECLIPSE 18X1.5 (NEEDLE) ×1 IMPLANT
NEEDLE HYPO 18GX1.5 SHARP (NEEDLE) ×2
NEEDLE MA TROC 1/2 (NEEDLE) ×3 IMPLANT
NEEDLE MA TROC 1/2 CIR (NEEDLE) IMPLANT
NEEDLE SPNL 18GX3.5 QUINCKE PK (NEEDLE) ×3 IMPLANT
NS IRRIG 1000ML POUR BTL (IV SOLUTION) ×3 IMPLANT
PACK TOTAL JOINT (CUSTOM PROCEDURE TRAY) ×3 IMPLANT
PASSER SUT SWANSON 36MM LOOP (INSTRUMENTS) ×6 IMPLANT
POSITIONER SURGICAL ARM (MISCELLANEOUS) ×3 IMPLANT
SPONGE GAUZE 2X2 STER 10/PKG (GAUZE/BANDAGES/DRESSINGS)
SPONGE LAP 18X18 X RAY DECT (DISPOSABLE) IMPLANT
STAPLER VISISTAT 35W (STAPLE) ×3 IMPLANT
SUT ETHIBOND 2 OS 4 DA (SUTURE) IMPLANT
SUT ETHIBOND 5 LR DA (SUTURE) IMPLANT
SUT FIBERWIRE #2 38 T-5 BLUE (SUTURE)
SUT FIBERWIRE #5 38 BLUE (WIRE) ×9 IMPLANT
SUT MNCRL AB 4-0 PS2 18 (SUTURE) ×3 IMPLANT
SUT VIC AB 0 CT1 27 (SUTURE)
SUT VIC AB 0 CT1 27XBRD ANTBC (SUTURE) IMPLANT
SUT VIC AB 1 CT1 36 (SUTURE) ×15 IMPLANT
SUT VIC AB 2-0 CT1 27 (SUTURE)
SUT VIC AB 2-0 CT1 TAPERPNT 27 (SUTURE) IMPLANT
SUTURE FIBERWR #2 38 T-5 BLUE (SUTURE) IMPLANT
SYR 30ML LL (SYRINGE) ×3 IMPLANT
SYR 50ML LL SCALE MARK (SYRINGE) IMPLANT
TOWEL OR 17X26 10 PK STRL BLUE (TOWEL DISPOSABLE) ×6 IMPLANT
WATER STERILE IRR 1500ML POUR (IV SOLUTION) ×3 IMPLANT

## 2016-08-14 NOTE — Anesthesia Postprocedure Evaluation (Signed)
Anesthesia Post Note  Patient: Jeffery Benson  Procedure(s) Performed: Procedure(s) (LRB): REPAIR QUADRICEP TENDON (Right)  Patient location during evaluation: PACU Anesthesia Type: General and Regional Level of consciousness: awake and alert and oriented Pain management: pain level controlled Vital Signs Assessment: post-procedure vital signs reviewed and stable Respiratory status: spontaneous breathing, nonlabored ventilation, respiratory function stable and patient connected to nasal cannula oxygen Cardiovascular status: blood pressure returned to baseline and stable Postop Assessment: no signs of nausea or vomiting Anesthetic complications: no       Last Vitals:  Vitals:   08/14/16 2000 08/14/16 2015  BP: (!) 162/84 (!) 152/76  Pulse: 79 77  Resp: (!) 26 (!) 26  Temp:      Last Pain:  Vitals:   08/14/16 2015  TempSrc:   PainSc: 6         RLE Motor Response: Purposeful movement;Responds to commands (08/14/16 2015) RLE Sensation: Full sensation (08/14/16 2015)      Erling Cruz. Dejanay Wamboldt

## 2016-08-14 NOTE — Anesthesia Preprocedure Evaluation (Addendum)
Anesthesia Evaluation  Patient identified by MRN, date of birth, ID band Patient awake    Reviewed: Allergy & Precautions, NPO status , Patient's Chart, lab work & pertinent test results  Airway Mallampati: III  TM Distance: >3 FB Neck ROM: Full    Dental  (+) Dental Advisory Given   Pulmonary neg pulmonary ROS,    breath sounds clear to auscultation       Cardiovascular (-) hypertensionnegative cardio ROS   Rhythm:Regular Rate:Normal     Neuro/Psych negative neurological ROS     GI/Hepatic negative GI ROS, Neg liver ROS,   Endo/Other  Morbid obesity  Renal/GU negative Renal ROS     Musculoskeletal   Abdominal (+) + obese,   Peds  Hematology negative hematology ROS (+)   Anesthesia Other Findings   Reproductive/Obstetrics                            Anesthesia Physical Anesthesia Plan  ASA: III  Anesthesia Plan: General   Post-op Pain Management: GA combined w/ Regional for post-op pain   Induction: Intravenous  Airway Management Planned: Oral ETT  Additional Equipment:   Intra-op Plan:   Post-operative Plan: Extubation in OR  Informed Consent: I have reviewed the patients History and Physical, chart, labs and discussed the procedure including the risks, benefits and alternatives for the proposed anesthesia with the patient or authorized representative who has indicated his/her understanding and acceptance.   Dental advisory given  Plan Discussed with: CRNA and Surgeon  Anesthesia Plan Comments: (Plan routine monitors, GETA with femoral nerve block for post op analgesia)        Anesthesia Quick Evaluation

## 2016-08-14 NOTE — Progress Notes (Signed)
AssistedDr. C.Jackson with right, femoral block. Side rails up, monitors on throughout procedure. See vital signs in flow sheet. Tolerated Procedure well.

## 2016-08-14 NOTE — H&P (Signed)
PREOPERATIVE H&P  Chief Complaint: Right knee quad tendon rupture  HPI: Jeffery Benson is a 41 y.o. male who presents for preoperative history and physical with a diagnosis of Right knee quad tendon rupture.  He has elected for surgical management.   Past Medical History:  Diagnosis Date  . History of chickenpox    Past Surgical History:  Procedure Laterality Date  . ACHILLES TENDON REPAIR  2011  . SHOULDER HEMI-ARTHROPLASTY Right 11/09/2012   Procedure: RIGHT SHOULDER HEMI-ARTHROPLASTY;  Surgeon: Mable Paris, MD;  Location: [facility] OR;  Service: Orthopedics;  Laterality: Right;   Social History   Social History  . Marital status: Married    Spouse name: N/A  . Number of children: N/A  . Years of education: N/A   Social History Main Topics  . Smoking status: Never Smoker  . Smokeless tobacco: Never Used  . Alcohol use Yes     Comment: rare  . Drug use: No  . Sexual activity: Yes   Other Topics Concern  . None   Social History Narrative  . None   Family History  Problem Relation Age of Onset  . Cancer Mother     bladder & ovarian  . Diabetes Mother   . Cancer Father     colon  . Depression Maternal Aunt   . Depression Maternal Grandmother   . Alcohol abuse Paternal Grandfather    No Known Allergies Prior to Admission medications   Medication Sig Start Date End Date Taking? Authorizing Provider  ibuprofen (ADVIL,MOTRIN) 200 MG tablet Take 400 mg by mouth every 6 (six) hours as needed for fever, headache, mild pain, moderate pain or cramping.   Yes Historical Provider, MD     Positive ROS: All other systems have been reviewed and were otherwise negative with the exception of those mentioned in the HPI and as above.  Physical Exam: General: Alert, no acute distress Cardiovascular: No pedal edema Respiratory: No cyanosis, no use of accessory musculature GI: No organomegaly, abdomen is soft and non-tender Skin: No lesions in the area of chief  complaint Neurologic: Sensation intact distally Psychiatric: Patient is competent for consent with normal mood and affect Lymphatic: No axillary or cervical lymphadenopathy  MUSCULOSKELETAL: RLE: unable to perform SLR. NVI.  Assessment: Right knee quad tendon rupture  Plan: Plan for Procedure(s): REPAIR QUADRICEP TENDON  The risks benefits and alternatives were discussed with the patient including but not limited to the risks of nonoperative treatment, versus surgical intervention including infection, bleeding, nerve injury,  blood clots, cardiopulmonary complications, morbidity, mortality, among others, and they were willing to proceed.   Channon Ambrosini, Cloyde Reams, MD Cell [phone]   08/14/2016 4:05 PM

## 2016-08-14 NOTE — Discharge Instructions (Signed)
Patellar Tendon Tear Rehab Ask your health care provider which exercises are safe for you. Do exercises exactly as told by your health care provider and adjust them as directed. It is normal to feel mild stretching, pulling, tightness, or discomfort as you do these exercises, but you should stop right away if you feel sudden pain or your pain gets worse.Do not begin these exercises until told by your health care provider. Stretching and range of motion exercises These exercises warm up your muscles and joints and improve the movement and flexibility of your knee. These exercises also help to relieve pain and stiffness. Exercise A: Knee flexion, active   1. Lie on your back with both knees straight. If this causes back discomfort, bend your healthy knee so your foot is flat on the floor. 2. Slowly slide your left / right heel back toward your buttocks until you feel a gentle stretch in the front of your knee or thigh, or until your knee is at a __________ degree angle. 3. Hold this position for __________ seconds. 4. Slowly slide your left / right heel back to the starting position. Repeat __________ times. Complete this exercise __________ times a day. Strengthening exercises These exercises build strength and endurance in your knee. Endurance is the ability to use your muscles for a long time, even after they get tired. Exercise B: Quadriceps, isometric   1. Lie on your back with your left / right leg extended and your other knee bent. 2. Slowly tense the muscles in the front of your left / right thigh. When you do this, you should see your kneecap slide up toward your hip or see increased dimpling just above the knee. This motion will push the back of the knee down toward the surface that is under it. 3. For __________ seconds, keep the muscle as tight as you can without increasing your pain. 4. Relax the muscles slowly and completely. Repeat __________ times. Complete this exercise  __________ times a day. Exercise C: Straight leg raises (  quadriceps) 1. Lie on your back with your left / right leg extended and your other knee bent. 2. Tense the muscles in the front of your left / right thigh. When you do this, you should see your kneecap slide up or see increased dimpling just above your knee. 3. Keep these muscles tight as you raise your leg 4-6 inches (10-15 cm) off the floor. Do not let your knee bend. 4. Hold this position for __________ seconds. 5. Keep these muscles tense as you lower your leg. 6. Relax your muscles slowly and completely. Repeat __________ times. Complete this exercise __________ times a day. Exercise D: Straight leg raises ( hip abductors) 1. Lie on your side with your left / right leg in the top position. Lie so your head, shoulder, knee, and hip line up. Bend your bottom knee to help you keep your balance. 2. Lift your top leg 4-6 inches (10-15 cm) while keeping your toes pointed straight ahead. 3. Hold this position for __________ seconds. 4. Slowly lower your leg to the starting position. 5. Allow your muscles to relax completely. Repeat __________ times. Complete this exercise __________ times a day. Exercise E: Straight leg raises ( hip extensors) 1. Lie on your abdomen on a firm surface. 2. Tense the muscles in your buttocks and lift your left / right leg about 4 inches (10 cm). Keep your knee straight as you lift your leg. If you cannot lift your leg  that high without arching your back, place a pillow under your hips. 3. Hold this position for __________ seconds. 4. Slowly lower your leg to the starting position. 5. Allow your muscles to relax completely. Repeat __________ times. Complete this exercise __________ times a day. This information is not intended to replace advice given to you by your health care provider. Make sure you discuss any questions you have with your health care provider. Document Released: 04/28/2005 Document  Revised: 01/01/2016 Document Reviewed: 04/06/2015 Elsevier Interactive Patient Education  2017 Elsevier [employer]   Cryotherapy WHAT IS CRYOTHERAPY? Cryotherapy, or cold therapy, is a treatment that uses cold temperatures to treat an injury or medical condition. It includes using cold packs or ice packs to reduce pain and swelling. Only use cryotherapy if your doctor says it is okay. HOW DO I USE CRYOTHERAPY?  Place a towel between the cold source and your skin.  Apply the cold source for no more than 20 minutes at a time.  Check your skin after 5 minutes to make sure there are no signs of a poor response to cold or skin damage. Check for:  White spots on your skin. Your skin may look blotchy or mottled.  Skin that looks blue or pale.  Skin that feels waxy or hard.  Repeat these steps as many times each day as told by your doctor. HOW CAN I MAKE A COLD PACK? When using a cold pack at home to reduce pain and swelling, you can use:  A silica gel cold pack that has been left in the freezer. You can buy this online or in stores.  A plastic bag of frozen vegetables.  A sealable plastic bag that has been filled with crushed ice. Always wrap the pack in a dry or damp towel to avoid direct contact with your skin. WHEN SHOULD I CALL MY DOCTOR? Call your doctor if:  You start to have white spots on your skin. This may give your skin a blotchy or mottled look.  Your skin turns blue or pale.  Your skin becomes waxy or hard.  Your swelling gets worse. This information is not intended to replace advice given to you by your health care provider. Make sure you discuss any questions you have with your health care provider. Document Released: 10/15/2007 Document Revised: 10/04/2015 Document Reviewed: 01/10/2015 Elsevier Interactive Patient Education  2017 Elsevier Inc. DO NOT BEND YOUR KNEE FOR ANY REASON! Wear knee immobilizer at all times. You may weight bear as tolerated in the knee  immobilizer. Do not remove your dressing. No driving.  Call the office as soon as possible at [phone] to schedule a follow up appointment in 2 weeks.

## 2016-08-14 NOTE — Brief Op Note (Signed)
08/14/2016  7:38 PM  PATIENT:  [name]  41 y.o. male  PRE-OPERATIVE DIAGNOSIS:  Right knee quad tendon rupture  POST-OPERATIVE DIAGNOSIS:  right quadricep tendon rupture  PROCEDURE:  Procedure(s): REPAIR QUADRICEP TENDON (Right)  SURGEON:  Surgeon(s) and Role:    * Samson Frederic, MD - Primary  PHYSICIAN ASSISTANT: none  ASSISTANTS: none   ANESTHESIA:   general  EBL:  Total I/O In: 900 [I.V.:900] Out: -   BLOOD ADMINISTERED:none  DRAINS: none   LOCAL MEDICATIONS USED:  MARCAINE     SPECIMEN:  No Specimen  DISPOSITION OF SPECIMEN:  N/A  COUNTS:  YES  TOURNIQUET:  * Missing tourniquet times found for documented tourniquets in log:  [id number] *  DICTATION: .Other Dictation: Dictation Number [id number]  PLAN OF CARE: Discharge to home after PACU  PATIENT DISPOSITION:  PACU - hemodynamically stable.   Delay start of Pharmacological VTE agent (>24hrs) due to surgical blood loss or risk of bleeding: no

## 2016-08-14 NOTE — Anesthesia Procedure Notes (Signed)
Anesthesia Regional Block: Femoral nerve block   Pre-Anesthetic Checklist: ,, timeout performed, Correct Patient, Correct Site, Correct Laterality, Correct Procedure,, site marked, risks and benefits discussed, Surgical consent,  Pre-op evaluation,  At surgeon's request and post-op pain management  Laterality: Right and Lower  Prep: chloraprep       Needles:  Injection technique: Single-shot  Needle Type: Stimulator Needle - 40     Needle Length: 5cm  Needle Gauge: 21     Additional Needles:   Procedures:, nerve stimulator,,,,,,,   Nerve Stimulator or Paresthesia:  Response: patella twitch, 0.47 mA, 0.1 ms,   Additional Responses:   Narrative:  Start time: 08/14/2016 4:06 PM End time: 08/14/2016 4:12 PM Injection made incrementally with aspirations every 5 mL.  Performed by: Personally  Anesthesiologist: Jean Rosenthal, Anna-Marie Coller  Additional Notes: Pt identified in Holding room.  Monitors applied. Working IV access confirmed. Sterile prep R groin.  #21ga PNS to patella twitch at 0.36mA threshold.  30cc 0.5% Bupivacaine with 1:200k epi injected incrementally after negative test dose.  Patient asymptomatic, VSS, no heme aspirated, tolerated well.  Sandford Craze, MD

## 2016-08-14 NOTE — Anesthesia Procedure Notes (Signed)
Procedure Name: Intubation Date/Time: 08/14/2016 5:02 PM Performed by: Epimenio Sarin Pre-anesthesia Checklist: Patient identified, Emergency Drugs available, Suction available, Patient being monitored and Timeout performed Patient Re-evaluated:Patient Re-evaluated prior to inductionOxygen Delivery Method: Circle system utilized Preoxygenation: Pre-oxygenation with 100% oxygen Intubation Type: IV induction and Rapid sequence Ventilation: Mask ventilation without difficulty Laryngoscope Size: Glidescope and 4 Grade View: Grade I Tube type: Oral Tube size: 7.5 mm Number of attempts: 1 Airway Equipment and Method: Stylet Placement Confirmation: ETT inserted through vocal cords under direct vision,  positive ETCO2 and breath sounds checked- equal and bilateral Secured at: 24 cm Tube secured with: Tape Dental Injury: Teeth and Oropharynx as per pre-operative assessment

## 2016-08-14 NOTE — Transfer of Care (Signed)
Immediate Anesthesia Transfer of Care Note  Patient: Jeffery Benson  Procedure(s) Performed: Procedure(s): REPAIR QUADRICEP TENDON (Right)  Patient Location: PACU  Anesthesia Type:General and regional  Level of Consciousness:  sedated, patient cooperative and responds to stimulation  Airway & Oxygen Therapy:Patient Spontanous Breathing and Patient connected to face mask oxgen  Post-op Assessment:  Report given to PACU RN and Post -op Vital signs reviewed and stable  Post vital signs:  Reviewed and stable  Last Vitals:  Vitals:   08/14/16 1644 08/14/16 1915  BP:  (!) 191/99  Pulse: 67 90  Resp: (!) 0 16  Temp:  37 C    Complications: No apparent anesthesia complications

## 2016-08-15 ENCOUNTER — Encounter (HOSPITAL_COMMUNITY): Payer: Self-pay | Admitting: Orthopedic Surgery

## 2016-08-15 DIAGNOSIS — S76111A Strain of right quadriceps muscle, fascia and tendon, initial encounter: Secondary | ICD-10-CM | POA: Diagnosis not present

## 2016-08-15 MED ORDER — METOPROLOL TARTRATE 25 MG PO TABS
25.0000 mg | ORAL_TABLET | Freq: Once | ORAL | Status: AC
  Administered 2016-08-15: 25 mg via ORAL
  Filled 2016-08-15: qty 1

## 2016-08-15 NOTE — Progress Notes (Signed)
Paged [employer], [place], on call for [facility] to notify of increased BP readings since patient has been on 6 [place] post surgery. Paged at 0020, received return phone call at 0045, orders received for one time dose of Metoprolol  PO.

## 2016-08-15 NOTE — Discharge Summary (Signed)
Physician Discharge Summary  Patient ID: Jeffery Benson MRN: [id number] DOB/AGE: May 28, 1975 41 y.o.  Admit date: 08/14/2016 Discharge date: 08/15/2016  Admission Diagnoses:  Rupture of right quadriceps tendon  Discharge Diagnoses:  Principal Problem:   Rupture of right quadriceps tendon   Past Medical History:  Diagnosis Date  . History of chickenpox     Surgeries: Procedure(s): REPAIR QUADRICEP TENDON on 08/14/2016   Consultants (if any):   Discharged Condition: Improved  Hospital Course: Jeffery Benson is an 41 y.o. male who was admitted 08/14/2016 with a diagnosis of Rupture of right quadriceps tendon and went to the operating room on 08/14/2016 and underwent the above named procedures.    He was given perioperative antibiotics:  Anti-infectives    Start     Dose/Rate Route Frequency Ordered Stop   08/14/16 2300  ceFAZolin (ANCEF) IVPB 2g/100 mL premix     2 g 200 mL/hr over 30 Minutes Intravenous Every 6 hours 08/14/16 2102 08/15/16 1039   08/14/16 0600  ceFAZolin (ANCEF) 3 g in dextrose 5 % 50 mL IVPB     3 g 130 mL/hr over 30 Minutes Intravenous On call to O.R. 08/13/16 1237 08/14/16 1705    .  He was given sequential compression devices, early ambulation, and lovenox in house --> home on ASA for DVT prophylaxis.  He benefited maximally from the hospital stay and there were no complications.    Recent vital signs:  Vitals:   08/15/16 0630 08/15/16 0942  BP: 140/82 (!) 144/96  Pulse: 70 79  Resp: 18 17  Temp: 98.6 F (37 C) 99 F (37.2 C)    Recent laboratory studies:  Lab Results  Component Value Date   HGB 14.9 08/14/2016   HGB 14.6 08/12/2016   HGB 12.7 (L) 11/10/2012   Lab Results  Component Value Date   WBC 11.9 (H) 08/14/2016   PLT 220 08/14/2016   Lab Results  Component Value Date   INR 1.08 11/03/2012   Lab Results  Component Value Date   NA 135 11/10/2012   K 3.5 11/10/2012   CL 101 11/10/2012   CO2 24 11/10/2012   BUN 13 11/10/2012    CREATININE 1.10 08/14/2016   GLUCOSE 166 (H) 11/10/2012    Discharge Medications:   Allergies as of 08/15/2016   No Known Allergies     Medication List    STOP taking these medications   ibuprofen 200 MG tablet Commonly known as:  ADVIL,MOTRIN     TAKE these medications   aspirin EC 325 MG tablet Take 1 tablet (325 mg total) by mouth 2 (two) times daily after a meal.   docusate sodium 100 MG capsule Commonly known as:  COLACE Take 1 capsule (100 mg total) by mouth 2 (two) times daily.   HYDROcodone-acetaminophen 5-325 MG tablet Commonly known as:  NORCO Take 1-2 tablets by mouth every 4 (four) hours as needed for moderate pain.   ondansetron 4 MG tablet Commonly known as:  ZOFRAN Take 1 tablet (4 mg total) by mouth every 8 (eight) hours as needed for nausea or vomiting.   senna 8.6 MG Tabs tablet Commonly known as:  SENOKOT Take 2 tablets (17.2 mg total) by mouth at bedtime.            Durable Medical Equipment        Start     Ordered   08/15/16 1016  For home use only DME Walker wide  Once    Comments:  Wide rolling walker  Question:  Patient needs a walker to treat with the following condition  Answer:  Quadriceps tendon rupture   08/15/16 1015   08/15/16 0924  DME Walker rolling  Once    Comments:  Wide  Question:  Patient needs a walker to treat with the following condition  Answer:  Rupture of right quadriceps tendon   08/15/16 0924   08/14/16 2103  DME 3 n 1  Once     08/14/16 2102      Diagnostic Studies: No results found.  Disposition: 01-Home or Self Care  Discharge Instructions    Call MD / Call 911    Complete by:  As directed    If you experience chest pain or shortness of breath, CALL 911 and be transported to the hospital emergency room.  If you develope a fever above 101 F, pus (white drainage) or increased drainage or redness at the wound, or calf pain, call your surgeon's office.   Constipation Prevention    Complete by:  As  directed    Drink plenty of fluids.  Prune juice may be helpful.  You may use a stool softener, such as Colace (over the counter) 100 mg twice a day.  Use MiraLax (over the counter) for constipation as needed.   Diet - low sodium heart healthy    Complete by:  As directed    Discharge instructions    Complete by:  As directed    Wear knee immobilizer at all times DO NOT BEND YOUR KNEE   Increase activity slowly as tolerated    Complete by:  As directed       Follow-up Information    Jeffery Benson, Cloyde Reams, MD. Schedule an appointment as soon as possible for a visit in 2 weeks.   Specialty:  Orthopedic Surgery Why:  For suture removal, For wound re-check Contact information: [address] [place] [address] [phone]            Signed: Garnet Koyanagi 08/15/2016, 12:09 PM

## 2016-08-15 NOTE — Op Note (Signed)
NAME:  Jeffery Benson, Jeffery Benson NO.:  MEDICAL RECORD NO.:  [id number]  LOCATION:                                 FACILITY:  PHYSICIAN:  Samson Frederic, MD     DATE OF BIRTH:  1975/05/16  DATE OF PROCEDURE:  08/14/2016 DATE OF DISCHARGE:                              OPERATIVE REPORT   PREOPERATIVE DIAGNOSIS:  Right quadriceps tendon rupture.  POSTOPERATIVE DIAGNOSIS:  Right quadriceps tendon rupture.  PROCEDURE PERFORMED:  Repair of right quadriceps tendon.  SURGEON:  Samson Frederic, M.D.  ASSISTANT:  Staff.  ANESTHESIA:  General plus femoral nerve block.  ANTIBIOTICS:  3 g Ancef.  COMPLICATIONS:  None.  SPECIMENS:  None.  TUBES AND DRAINS:  None.  DISPOSITION:  Stable to PACU.  INDICATIONS:  The patient is a 41 year old male, injured his right knee about 2 weeks ago while playing tennis.  He was unable to weight bear. The knee was buckling.  I saw him in the office.  I had a high suspicion for quad tendon rupture.  I ordered an MRI, confirmed the diagnosis.  We discussed the risks, benefits, and alternatives to repair the right quadriceps tendon.  He elected to proceed.  DESCRIPTION OF PROCEDURE:  I identified the patient in the holding area. I marked the surgical site.  He was taken to the operating room. Femoral block was placed.  General anesthesia was induced.  He was positioned on the bed.  All bony prominences were well padded.  A tourniquet was applied to the right thigh.  Right lower extremity was prepped and draped in normal sterile surgical fashion.  Time-out was called verifying site and side of surgery.  He did receive IV antibiotics within 60 minutes of beginning the procedure.  I used gravity to exsanguinate the lower extremity.  I elevated the tourniquet to 350 mmHg with the knee in flexion.  I made a midline incision centered over the proximal pole of the patella.  I created full- thickness skin flaps.  I identified his tear.  He  had a thin layer of retinaculum intact over the tear.  He had a full-thickness chronic appearing tear of the quadriceps tendon with about centimeter and a half of retraction.  I identified the tear was of the proximal pole of the patella.  I rongeured the proximal pole of the patella to a bleeding surface.  I freshened the torn edges of the retinaculum and tendon with a #10 blade.  I then used a 2 mm drill bit.  I drilled 3 vertical tunnels.  I then used a #5 FiberWire and I used a total of 2 stitches for a grand total of 4 limbs of a running Krackow within the body of the quadriceps tendon.  The 4 strands were passed through the 3 tunnels. The sutures were pulled tight to eliminate creep from the system.  The sutures were passed through the tunnels.  The knee was brought into full extension.  The patella was held proximally as possible.  The sutures were tightened and clipped.  I repaired the retinaculum with #1 interrupted Vicryl sutures.  I tested the repair.  I  was able to flex him down over 60 degrees before there was any significant gapping.  The wound was irrigated with saline.  I closed the deep dermal tissue with 2- 0 interrupted Vicryl.  Skin with staples.  Glue was applied.  Once the glue was dried, Aquacel was placed.  Padded bulky dressing was applied followed by a knee immobilizer.  The patient was then extubated and taken to PACU in stable condition.  Sponge, needle, and instrument counts were correct at the end of the case x2.  There were no known complications.  I discussed the operative events and findings with the patient's family. I stressed the importance of keeping his knee in full extension.  The patient had confided to me just prior to surgery that he was removing his knee immobilizer in order to drive.  I told him this was completely unacceptable and would cause the repair to fail.  He needs to wear the immobilizer at all times.  Maintain full extension.  He may  weightbear as tolerated with the brace on.  Put him on couple weeks of aspirin for DVT prophylaxis.  Send him home with pain medicine.  I will see him in the office 2 weeks after discharge for suture removal.  All questions were solicited and answered.          ______________________________ Samson Frederic, MD     BS/MEDQ  D:  08/14/2016  T:  08/14/2016  Job:  [id number]

## 2016-08-15 NOTE — Care Management Note (Signed)
Case Management Note  Patient Details  Name: Jeffery Benson MRN: [id number] Date of Birth: 04/15/1976  Subjective/Objective:         41 yo admitted for REPAIR QUADRICEP TENDON on 08/14/2016              Action/Plan: Pt from home with spouse. Order for Wide RW received and [facility] DME alerted of order. No other CM needs communicated.  Expected Discharge Date:  08/15/16               Expected Discharge Plan:  Home/Self Care  In-House Referral:     Discharge planning Services  CM Consult  Post Acute Care Choice:    Choice offered to:     DME Arranged:  Dan Humphreys wide (wide rolling walker) DME Agency:  [facility]  HH Arranged:    HH Agency:     Status of Service:  Completed, signed off  If discussed at Long Length of Stay Meetings, dates discussed:    Additional CommentsBartholome Bill, RN 08/15/2016, 12:33 PM  [phone]

## 2016-08-15 NOTE — Progress Notes (Signed)
   Subjective:  Patient reports pain as mild to moderate.  Pain control issues in PACU, better now.  Objective:   VITALS:   Vitals:   08/14/16 2359 08/15/16 0000 08/15/16 0155 08/15/16 0630  BP: (!) 180/91 (!) 187/88 (!) 141/76 140/82  Pulse:  88 79 70  Resp:   18 18  Temp:   98.8 F (37.1 C) 98.6 F (37 C)  TempSrc:   Oral Oral  SpO2:  96% 97% 97%  Weight:      Height:        NAD ABD soft Sensation intact distally Intact pulses distally Dorsiflexion/Plantar flexion intact Incision: dressing C/D/I Knee immobilizer in place   Lab Results  Component Value Date   WBC 11.9 (H) 08/14/2016   HGB 14.9 08/14/2016   HCT 43.0 08/14/2016   MCV 82.4 08/14/2016   PLT 220 08/14/2016   BMET    Component Value Date/Time   NA 135 11/10/2012 0634   K 3.5 11/10/2012 0634   CL 101 11/10/2012 0634   CO2 24 11/10/2012 0634   GLUCOSE 166 (H) 11/10/2012 0634   BUN 13 11/10/2012 0634   CREATININE 1.10 08/14/2016 2153   CALCIUM 8.9 11/10/2012 0634   GFRNONAA >60 08/14/2016 2153   GFRAA >60 08/14/2016 2153     Assessment/Plan: 1 Day Post-Op   Principal Problem:   Rupture of right quadriceps tendon   Knee immobilizer at all times WBAT in KI with walker lovenox in house, home on ASA PT D/C home   Bunny Kleist, Cloyde Reams 08/15/2016, 8:45 AM   Samson Frederic, MD Cell [phone]

## 2016-08-15 NOTE — Progress Notes (Signed)
Pt to d/c home. RW delivered to room prior to d/c. Prescriptions given. AVS reviewed and "My Chart" discussed with pt. Pt capable of verbalizing medications, signs and symptoms of infection, and follow-up appointments. Understands to maintain surgical dsg and KI until follow-up appointment. Remains hemodynamically stable. No signs and symptoms of distress. Educated pt to return to ER in the case of SOB, dizziness, or chest pain.

## 2016-08-15 NOTE — Evaluation (Signed)
Physical Therapy Evaluation Patient Details Name: Jeffery Benson MRN: [id number] DOB: [birth year] Today's Date: 08/15/2016   History of Present Illness  Pt s/p R quad tendon repair and with hx of R shoulder hemi-arthroplasty (14) and achillies repair (11)  Clinical Impression  Pt admitted as above and presenting with functional mobility limitations 2* post op pain and KI in place.  Pt currently mobilizing at Sup to MOD I level with RW and eager for dc home.    Follow Up Recommendations No PT follow up    Equipment Recommendations  Rolling walker with 5" wheels    Recommendations for Other Services       Precautions / Restrictions Precautions Precautions: Fall Required Braces or Orthoses: Knee Immobilizer - Left Knee Immobilizer - Left: On at all times Restrictions Weight Bearing Restrictions: No Other Position/Activity Restrictions: WBAT      Mobility  Bed Mobility Overal bed mobility: Modified Independent             General bed mobility comments: Pt OOB unassisted  Transfers Overall transfer level: Needs assistance Equipment used: Rolling walker (2 wheeled) Transfers: Sit to/from Stand Sit to Stand: Supervision         General transfer comment: cues for LE management and use of UEs to self assist  Ambulation/Gait Ambulation/Gait assistance: Min guard;Supervision Ambulation Distance (Feet): 400 Feet Assistive device: Rolling walker (2 wheeled) Gait Pattern/deviations: Step-to pattern;Step-through pattern;Shuffle Gait velocity: mod pace   General Gait Details: cues for posture and position from RW.  Pt states he would like to use cane.  Explained how at risk he would be for knee buckling and disrupting surgery - DR Swinteck arrived and confirmed same with pt  Stairs Stairs: Yes Stairs assistance: Min guard;Supervision Stair Management: Two rails;Step to pattern;Forwards Number of Stairs: 5 General stair comments: min cues for sequence and foot  placement  Wheelchair Mobility    Modified Rankin (Stroke Patients Only)       Balance Overall balance assessment: No apparent balance deficits (not formally assessed)                                           Pertinent Vitals/Pain Pain Assessment: 0-10 Pain Score: 1  Pain Location: R knee Pain Descriptors / Indicators: Sore Pain Intervention(s): Limited activity within patient's tolerance;Monitored during session;Premedicated before session;Ice applied    Home Living Family/patient expects to be discharged to:: Private residence Living Arrangements: Spouse/significant other Available Help at Discharge: Family Type of Home: House Home Access: Level entry     Home Layout: One level Home Equipment: Cane - single point;Crutches      Prior Function Level of Independence: Independent               Hand Dominance   Dominant Hand: Right    Extremity/Trunk Assessment   Upper Extremity Assessment Upper Extremity Assessment: Overall WFL for tasks assessed    Lower Extremity Assessment Lower Extremity Assessment: RLE deficits/detail RLE Deficits / Details: KI in place, NO ROM at knee RLE: Unable to fully assess due to immobilization    Cervical / Trunk Assessment Cervical / Trunk Assessment: Normal  Communication   Communication: No difficulties  Cognition Arousal/Alertness: Awake/alert Behavior During Therapy: WFL for tasks assessed/performed Overall Cognitive Status: Within Functional Limits for tasks assessed  General Comments      Exercises General Exercises - Lower Extremity Ankle Circles/Pumps: AROM;Both;Supine;20 reps   Assessment/Plan    PT Assessment Patient needs continued PT services  PT Problem List Decreased range of motion;Decreased mobility;Pain;Decreased knowledge of precautions;Decreased knowledge of use of DME       PT Treatment Interventions DME  instruction;Gait training;Stair training;Functional mobility training;Therapeutic activities    PT Goals (Current goals can be found in the Care Plan section)  Acute Rehab PT Goals Patient Stated Goal: Back to work PT Goal Formulation: All assessment and education complete, DC therapy    Frequency Min 1X/week   Barriers to discharge        Co-evaluation               End of Session Equipment Utilized During Treatment: Right knee immobilizer Activity Tolerance: Patient tolerated treatment well Patient left: in chair;with call bell/phone within reach Nurse Communication: Mobility status PT Visit Diagnosis: Difficulty in walking, not elsewhere classified (R26.2)    Time: 2956-2130 PT Time Calculation (min) (ACUTE ONLY): 20 min   Charges:   PT Evaluation $PT Eval Low Complexity: 1 Procedure     PT G Codes:   PT G-Codes **NOT FOR INPATIENT CLASS** Functional Assessment Tool Used: Clinical judgement Functional Limitation: Mobility: Walking and moving around Mobility: Walking and Moving Around Current Status (Q6578): At least 1 percent but less than 20 percent impaired, limited or restricted Mobility: Walking and Moving Around Goal Status [phone]): At least 1 percent but less than 20 percent impaired, limited or restricted Mobility: Walking and Moving Around Discharge Status [phone]): At least 1 percent but less than 20 percent impaired, limited or restricted      Phil Michels 08/15/2016, 1:20 PM

## 2022-05-09 ENCOUNTER — Emergency Department (HOSPITAL_COMMUNITY): Payer: 59

## 2022-05-09 ENCOUNTER — Inpatient Hospital Stay (HOSPITAL_COMMUNITY)
Admission: EM | Admit: 2022-05-09 | Discharge: 2022-05-13 | DRG: 286 | Disposition: A | Discharge status: Home / Self Care | Payer: 59 | Attending: Family Medicine | Admitting: Family Medicine

## 2022-05-09 DIAGNOSIS — I5A Non-ischemic myocardial injury (non-traumatic): Secondary | ICD-10-CM

## 2022-05-09 DIAGNOSIS — I441 Atrioventricular block, second degree: Secondary | ICD-10-CM | POA: Diagnosis not present

## 2022-05-09 DIAGNOSIS — G4730 Sleep apnea, unspecified: Secondary | ICD-10-CM | POA: Diagnosis present

## 2022-05-09 DIAGNOSIS — Z9189 Other specified personal risk factors, not elsewhere classified: Secondary | ICD-10-CM

## 2022-05-09 DIAGNOSIS — Z6841 Body Mass Index (BMI) 40.0 and over, adult: Secondary | ICD-10-CM | POA: Diagnosis not present

## 2022-05-09 DIAGNOSIS — I429 Cardiomyopathy, unspecified: Secondary | ICD-10-CM | POA: Diagnosis present

## 2022-05-09 DIAGNOSIS — G4733 Obstructive sleep apnea (adult) (pediatric): Secondary | ICD-10-CM

## 2022-05-09 DIAGNOSIS — Z1152 Encounter for screening for COVID-19: Secondary | ICD-10-CM | POA: Diagnosis not present

## 2022-05-09 DIAGNOSIS — E66813 Obesity, class 3: Secondary | ICD-10-CM | POA: Diagnosis present

## 2022-05-09 DIAGNOSIS — I472 Ventricular tachycardia, unspecified: Secondary | ICD-10-CM | POA: Diagnosis present

## 2022-05-09 DIAGNOSIS — Z8042 Family history of malignant neoplasm of prostate: Secondary | ICD-10-CM | POA: Diagnosis not present

## 2022-05-09 DIAGNOSIS — R7989 Other specified abnormal findings of blood chemistry: Secondary | ICD-10-CM | POA: Diagnosis present

## 2022-05-09 DIAGNOSIS — I10 Essential (primary) hypertension: Secondary | ICD-10-CM | POA: Diagnosis not present

## 2022-05-09 DIAGNOSIS — Z818 Family history of other mental and behavioral disorders: Secondary | ICD-10-CM

## 2022-05-09 DIAGNOSIS — I509 Heart failure, unspecified: Secondary | ICD-10-CM

## 2022-05-09 DIAGNOSIS — Z833 Family history of diabetes mellitus: Secondary | ICD-10-CM

## 2022-05-09 DIAGNOSIS — I200 Unstable angina: Principal | ICD-10-CM

## 2022-05-09 DIAGNOSIS — I5021 Acute systolic (congestive) heart failure: Secondary | ICD-10-CM | POA: Diagnosis not present

## 2022-05-09 DIAGNOSIS — I110 Hypertensive heart disease with heart failure: Secondary | ICD-10-CM | POA: Diagnosis not present

## 2022-05-09 DIAGNOSIS — E669 Obesity, unspecified: Secondary | ICD-10-CM | POA: Diagnosis present

## 2022-05-09 DIAGNOSIS — I4729 Other ventricular tachycardia: Secondary | ICD-10-CM | POA: Insufficient documentation

## 2022-05-09 DIAGNOSIS — Z7982 Long term (current) use of aspirin: Secondary | ICD-10-CM | POA: Diagnosis not present

## 2022-05-09 DIAGNOSIS — R079 Chest pain, unspecified: Secondary | ICD-10-CM | POA: Diagnosis not present

## 2022-05-09 DIAGNOSIS — Z79899 Other long term (current) drug therapy: Secondary | ICD-10-CM | POA: Diagnosis not present

## 2022-05-09 DIAGNOSIS — I5020 Unspecified systolic (congestive) heart failure: Secondary | ICD-10-CM | POA: Insufficient documentation

## 2022-05-09 HISTORY — DX: Essential (primary) hypertension: I10

## 2022-05-09 HISTORY — DX: Non-ischemic myocardial injury (non-traumatic): I5A

## 2022-05-09 LAB — BRAIN NATRIURETIC PEPTIDE: B Natriuretic Peptide: 406.6 pg/mL — ABNORMAL HIGH (ref 0.0–100.0)

## 2022-05-09 LAB — CBC WITH DIFFERENTIAL/PLATELET
Abs Immature Granulocytes: 0.03 10*3/uL (ref 0.00–0.07)
Basophils Absolute: 0.1 10*3/uL (ref 0.0–0.1)
Basophils Relative: 2 %
Eosinophils Absolute: 0.5 10*3/uL (ref 0.0–0.5)
Eosinophils Relative: 5 %
HCT: 43.5 % (ref 39.0–52.0)
Hemoglobin: 14.3 g/dL (ref 13.0–17.0)
Immature Granulocytes: 0 %
Lymphocytes Relative: 24 %
Lymphs Abs: 2.3 10*3/uL (ref 0.7–4.0)
MCH: 28.1 pg (ref 26.0–34.0)
MCHC: 32.9 g/dL (ref 30.0–36.0)
MCV: 85.5 fL (ref 80.0–100.0)
Monocytes Absolute: 0.8 10*3/uL (ref 0.1–1.0)
Monocytes Relative: 8 %
Neutro Abs: 5.8 10*3/uL (ref 1.7–7.7)
Neutrophils Relative %: 61 %
Platelets: 225 10*3/uL (ref 150–400)
RBC: 5.09 MIL/uL (ref 4.22–5.81)
RDW: 13.8 % (ref 11.5–15.5)
WBC: 9.4 10*3/uL (ref 4.0–10.5)
nRBC: 0 % (ref 0.0–0.2)

## 2022-05-09 LAB — BASIC METABOLIC PANEL
Anion gap: 8 (ref 5–15)
BUN: 15 mg/dL (ref 6–20)
CO2: 21 mmol/L — ABNORMAL LOW (ref 22–32)
Calcium: 9.2 mg/dL (ref 8.9–10.3)
Chloride: 110 mmol/L (ref 98–111)
Creatinine, Ser: 1.06 mg/dL (ref 0.61–1.24)
GFR, Estimated: >60 mL/min (ref >60)
Glucose, Bld: 112 mg/dL — ABNORMAL HIGH (ref 70–99)
Potassium: 4 mmol/L (ref 3.5–5.1)
Sodium: 139 mmol/L (ref 135–145)

## 2022-05-09 LAB — RESP PANEL BY RT-PCR (RSV, FLU A&B, COVID)  RVPGX2
Influenza A by PCR: NEGATIVE
Influenza B by PCR: NEGATIVE
Resp Syncytial Virus by PCR: NEGATIVE
SARS Coronavirus 2 by RT PCR: NEGATIVE

## 2022-05-09 LAB — TROPONIN I (HIGH SENSITIVITY)
Troponin I (High Sensitivity): 92 ng/L — ABNORMAL HIGH (ref <18)
Troponin I (High Sensitivity): 119 ng/L (ref <18)
Troponin I (High Sensitivity): 109 ng/L (ref <18)

## 2022-05-09 MED ORDER — SENNOSIDES-DOCUSATE SODIUM 8.6-50 MG PO TABS: 1.0000 | ORAL_TABLET | Freq: Every evening | ORAL | Status: DC | PRN

## 2022-05-09 MED ORDER — ACETAMINOPHEN 650 MG RE SUPP: 650.0000 mg | Freq: Four times a day (QID) | RECTAL | Status: DC | PRN

## 2022-05-09 MED ORDER — ONDANSETRON HCL 4 MG/2ML IJ SOLN: 4.0000 mg | Freq: Four times a day (QID) | INTRAMUSCULAR | Status: DC | PRN

## 2022-05-09 MED ORDER — ASPIRIN 81 MG PO CHEW
81.0000 mg | CHEWABLE_TABLET | Freq: Every day | ORAL | Status: DC
  Administered 2022-05-10 – 2022-05-12 (×3): 81 mg via ORAL
  Filled 2022-05-09 (×3): qty 1

## 2022-05-09 MED ORDER — SODIUM CHLORIDE 0.9% FLUSH
3.0000 mL | Freq: Two times a day (BID) | INTRAVENOUS | Status: DC
  Administered 2022-05-09 – 2022-05-12 (×5): 3 mL via INTRAVENOUS

## 2022-05-09 MED ORDER — POTASSIUM CHLORIDE CRYS ER 10 MEQ PO TBCR
10.0000 meq | EXTENDED_RELEASE_TABLET | Freq: Every day | ORAL | Status: DC
  Administered 2022-05-10: 10 meq via ORAL
  Filled 2022-05-09: qty 1

## 2022-05-09 MED ORDER — FUROSEMIDE 10 MG/ML IJ SOLN
40.0000 mg | Freq: Two times a day (BID) | INTRAMUSCULAR | Status: DC
  Administered 2022-05-09 – 2022-05-10 (×3): 40 mg via INTRAVENOUS
  Filled 2022-05-09 (×3): qty 4

## 2022-05-09 MED ORDER — ASPIRIN 325 MG PO TABS
325.0000 mg | ORAL_TABLET | Freq: Once | ORAL | Status: AC
  Administered 2022-05-09: 325 mg via ORAL
  Filled 2022-05-09: qty 1

## 2022-05-09 MED ORDER — ACETAMINOPHEN 325 MG PO TABS: 650.0000 mg | ORAL_TABLET | Freq: Four times a day (QID) | ORAL | Status: DC | PRN

## 2022-05-09 MED ORDER — HEPARIN BOLUS VIA INFUSION
4000.0000 [IU] | Freq: Once | INTRAVENOUS | Status: AC
  Administered 2022-05-09: 4000 [IU] via INTRAVENOUS
  Filled 2022-05-09: qty 4000

## 2022-05-09 MED ORDER — ONDANSETRON HCL 4 MG PO TABS: 4.0000 mg | ORAL_TABLET | Freq: Four times a day (QID) | ORAL | Status: DC | PRN

## 2022-05-09 MED ORDER — HYDRALAZINE HCL 20 MG/ML IJ SOLN: 10.0000 mg | Freq: Four times a day (QID) | INTRAMUSCULAR | Status: DC | PRN

## 2022-05-09 MED ORDER — HEPARIN (PORCINE) 25000 UT/250ML-% IV SOLN
3000.0000 [IU]/h | INTRAVENOUS | Status: DC
  Administered 2022-05-09: 2200 [IU]/h via INTRAVENOUS
  Administered 2022-05-11: 3100 [IU]/h via INTRAVENOUS
  Filled 2022-05-09 (×4): qty 250

## 2022-05-09 NOTE — H&P (Signed)
History and Physical    Jeffery Benson [id number] DOB: [birth year] DOA: 05/09/2022  PCP: Shawna Orleans, Doe-Hyun R, DO (Inactive)  Patient coming from: Home  I have personally briefly reviewed patient's old medical records in [place]  Chief Complaint: Dyspnea  HPI: Jeffery Benson is a 46 y.o. male with medical history significant for morbid obesity who presented to the ED for evaluation of exertional dyspnea.  Patient states that over the last month he has been having progressively worsening exertional dyspnea.  He has had an associated nonproductive cough.  Dyspnea is worse with activity and when lying flat.  His spouse notes that he has been snoring for many years and is concerned about underlying sleep apnea.  Last night patient was playing tennis.  He went to his first match but afterwards he developed significantly worsening shortness of breath.  He developed central nonradiating chest tightness which was new.  He has not had any diaphoresis, nausea, vomiting, abdominal pain, dysuria.  He does note some weight gain over this period of time.  He is not aware of any chronic medical conditions.  He does not take any medications regularly.  He denies any significant alcohol, tobacco, illicit drug use.  He reports a history of prostate cancer in his father, no reported cardiac history in the immediate family.  ED Course  Labs/Imaging on admission: I have personally reviewed following labs and imaging studies.  Initial vitals showed BP 158/118, pulse 96, RR 16, temp 98.0 F, SpO2 95% on room air.  Labs show sodium 139, potassium 4.0, bicarb 21, BUN 15, creatinine 1.06, serum glucose 112, BNP 406.6, troponin 92 > 119, WBC 9.4, hemoglobin 14.3, platelets 225,000.  Respiratory panel in process.  2 view chest x-ray shows enlarged cardiac silhouette with symmetric hazy lung opacities bilaterally.  EDP discussed with on-call cardiology.  Patient was given aspirin 325 mg.  Recommendation was  to start on IV heparin, medical admission to [facility].  The hospitalist service was consulted to admit for further evaluation and management.  Review of Systems: All systems reviewed and are negative except as documented in history of present illness above.   Past Medical History:  Diagnosis Date   History of chickenpox     Past Surgical History:  Procedure Laterality Date   ACHILLES TENDON REPAIR  2011   QUADRICEPS TENDON REPAIR Right 08/14/2016   Procedure: REPAIR QUADRICEP TENDON;  Surgeon: Rod Can, MD;  Location: [facility];  Service: Orthopedics;  Laterality: Right;   SHOULDER HEMI-ARTHROPLASTY Right 11/09/2012   Procedure: RIGHT SHOULDER HEMI-ARTHROPLASTY;  Surgeon: Nita Sells, MD;  Location: [facility];  Service: Orthopedics;  Laterality: Right;    Social History:  reports that he has never smoked. He has never used smokeless tobacco. He reports current alcohol use. He reports that he does not use drugs.  No Known Allergies  Family History  Problem Relation Age of Onset   Cancer Mother        bladder & ovarian   Diabetes Mother    Cancer Father        colon   Depression Maternal Aunt    Depression Maternal Grandmother    Alcohol abuse Paternal Grandfather      Prior to Admission medications   Medication Sig Start Date End Date Taking? Authorizing Provider  aspirin EC 325 MG tablet Take 1 tablet (325 mg total) by mouth 2 (two) times daily after a meal. 08/14/16   Swinteck, Aaron Edelman, MD  docusate sodium (COLACE)  100 MG capsule Take 1 capsule (100 mg total) by mouth 2 (two) times daily. 08/14/16   Swinteck, Aaron Edelman, MD  HYDROcodone-acetaminophen (NORCO) 5-325 MG tablet Take 1-2 tablets by mouth every 4 (four) hours as needed for moderate pain. 08/14/16   Swinteck, Aaron Edelman, MD  ondansetron (ZOFRAN) 4 MG tablet Take 1 tablet (4 mg total) by mouth every 8 (eight) hours as needed for nausea or vomiting. 08/14/16   Swinteck, Aaron Edelman, MD  senna (SENOKOT) 8.6 MG TABS tablet Take 2  tablets (17.2 mg total) by mouth at bedtime. 08/14/16   Rod Can, MD    Physical Exam: Vitals:   05/09/22 1622 05/09/22 1623 05/09/22 2105 05/09/22 2151  BP:   (!) 149/111   Pulse:   (!) 109   Resp:   15   Temp:    97.7 F (36.5 C)  TempSrc:    Oral  SpO2:   95%   Weight:  (!) 203.2 kg    Height: 6\' 4"  (1.93 m)      Constitutional: Obese man resting in bed with head elevated.  NAD, calm, comfortable Eyes: EOMI, lids and conjunctivae normal ENMT: Mucous membranes are moist. Posterior pharynx clear of any exudate or lesions.Normal dentition.  Neck: normal, supple, no masses. Respiratory: Diminished breath sounds throughout.  Slightly increased respiratory effort. No accessory muscle use.  Cardiovascular: Regular rate and rhythm, no murmurs / rubs / gallops.  +2 bilateral lower extremity edema. Abdomen: no tenderness, no masses palpated.  Musculoskeletal: no clubbing / cyanosis. No joint deformity upper and lower extremities. Good ROM, no contractures. Normal muscle tone.  Skin: no rashes, lesions, ulcers. No induration Neurologic: Sensation intact. Strength 5/5 in all 4.  Psychiatric: Alert and oriented x 3. Normal mood.   EKG: Personally reviewed. Sinus rhythm, rate 99, Q waves lateral leads, T wave flattening in lead III.  Assessment/Plan Principal Problem:   Acute congestive heart failure (HCC) Active Problems:   Elevated troponin   Obesity   At risk for sleep apnea   Jeffery Benson is a 46 y.o. male with medical history significant for morbid obesity who is admitted with likely new diagnosis of acute congestive heart failure.  Assessment and Plan: * Acute congestive heart failure (HCC) Presenting with DOE, orthopnea, peripheral edema, elevated BNP, pulmonary edema all consistent with new diagnosis of acute congestive heart failure.  Unspecified whether systolic or diastolic as no prior echo on file. -Start IV Lasix 40 mg BID -Obtain echocardiogram -Strict I/O's and  daily weights -Beta-blocker once more compensated, further GDMT pending TTE results  Elevated troponin Mildly elevated troponin 92 > 119 > 109.  May be demand ischemia in setting of new diagnosis of acute CHF.  Did have anginal type symptoms prior to admission with nonspecific EKG changes. -Cardiology recommending admission to [facility]; may consider cardiac cath for ischemic eval -Continue IV heparin -Echocardiogram as above -Continue aspirin 81 mg daily  At risk for sleep apnea Recommend outpatient sleep study.  TOC consult placed for PCP needs.  DVT prophylaxis: Heparin drip Code Status: Full code, confirmed with patient on admission Family Communication: Spouse at bedside Disposition Plan: From home and likely discharge to home pending clinical progress Consults called: Cardiology Severity of Illness: The appropriate patient status for this patient is INPATIENT. Inpatient status is judged to be reasonable and necessary in order to provide the required intensity of service to ensure the patient's safety. The patient's presenting symptoms, physical exam findings, and initial radiographic and laboratory data in  the context of their chronic comorbidities is felt to place them at high risk for further clinical deterioration. Furthermore, it is not anticipated that the patient will be medically stable for discharge from the hospital within 2 midnights of admission.   * I certify that at the point of admission it is my clinical judgment that the patient will require inpatient hospital care spanning beyond 2 midnights from the point of admission due to high intensity of service, high risk for further deterioration and high frequency of surveillance required.Darreld Mclean MD [facility]  If 7PM-7AM, please contact night-coverage [email or web address]  05/09/2022, 10:50 PM

## 2022-05-09 NOTE — Assessment & Plan Note (Addendum)
Recommend outpatient sleep study.  TOC consult placed for PCP needs.

## 2022-05-09 NOTE — Hospital Course (Signed)
Jeffery Benson is a 46 y.o. male with medical history significant for morbid obesity who is admitted with likely new diagnosis of acute congestive heart failure.

## 2022-05-09 NOTE — ED Triage Notes (Signed)
Patient reports shortness of breath starting about 1 month ago and gradually getting worse. Last night playing tennis and could not breathe well. Denies pain in triage. O2 sat in triage 98% on RA.  Witnessed cough in triage. Denies and phlegm.

## 2022-05-09 NOTE — Progress Notes (Signed)
ANTICOAGULATION CONSULT NOTE - Initial Consult  Pharmacy Consult for Heparin Indication: chest pain/ACS  No Known Allergies  Patient Measurements: Height: 6\' 4"  (193 cm) Weight: (!) 203.2 kg (448 lb) IBW/kg (Calculated) : 86.8 Heparin Dosing Weight:  137 kg  Vital Signs: Temp: 98 F (36.7 C) (12/29 1620) Temp Source: Oral (12/29 1620) BP: 149/111 (12/29 2105) Pulse Rate: 109 (12/29 2105)  Labs: Recent Labs    05/09/22 1736 05/09/22 2008  HGB 14.3  --   HCT 43.5  --   PLT 225  --   CREATININE 1.06  --   TROPONINIHS 92* 119*    Estimated Creatinine Clearance: 160.8 mL/min (by C-G formula based on SCr of 1.06 mg/dL).   Medical History: Past Medical History:  Diagnosis Date   History of chickenpox    Assessment: SOB AC/Heme: ACS. +troponins. Baseline CBC WNL. Start IV heparin.  Goal of Therapy:  Heparin level 0.3-0.7 units/ml Monitor platelets by anticoagulation protocol: Yes   Plan:  IV heparin 4000 unit bolus IV heparin 2200 units/hr Check heparin level in 6-8 hr Daily HL and CBC  Ryleigh Buenger S. 05/11/22, PharmD, BCPS Clinical Staff Pharmacist [email or web address] Merilynn Finland, Brynn Mulgrew Stillinger 05/09/2022,9:28 PM

## 2022-05-09 NOTE — ED Provider Triage Note (Signed)
Emergency Medicine Provider Triage Evaluation Note  Jeffery Benson , a 46 y.o. male  was evaluated in triage.  Pt complains of shortness of breath which has been worsening for the past month.  Patient states that yesterday he tried to play tennis and became extremely short of breath very rapidly.  He felt some central chest pressure at that time.  Today continues to complain of shortness of breath.  He denies any recent surgery, travel, cancer history, smoking history.  Denies any history of any pulmonary or cardiovascular problems.  Review of Systems  Positive: As above Negative: As above  Physical Exam  BP (!) 158/118 (BP Location: Left Arm)   Pulse 96   Temp 98 F (36.7 C) (Oral)   Resp 16   Ht 6\' 4"  (1.93 m)   Wt (!) 203.2 kg   SpO2 95%   BMI 54.53 kg/m  Gen:   Awake, no distress   Resp:  Normal effort  MSK:   Moves extremities without difficulty  Other:    Medical Decision Making  Medically screening exam initiated at 4:38 PM.  Appropriate orders placed.  was informed that the remainder of the evaluation will be completed by another provider, this initial triage assessment does not replace that evaluation, and the importance of remaining in the ED until their evaluation is complete.     [name], PA-C 05/09/22 1639

## 2022-05-09 NOTE — ED Provider Notes (Signed)
[facility]-EMERGENC[facility] DEPT Provider Note   CSN: [id number] Arrival date & time: 05/09/22  1612     History  Chief Complaint  Patient presents with   Shortness of Breath    Jeffery Benson is a 46 y.o. male here presenting with shortness of breath with exertion.  Patient states that he noticed shortness of breath for the last month or so.  He thought initially was a viral infection.  Patient states that it progressively got worse.  He states that he gets shortness of breath with minimal exertion now.  Yesterday he played tennis and got very short of breath after the first game.  He states that he had to stop playing the game.  He decided to come for evaluation today.  No history of CAD but patient has not seen doctor for 6 years.  Patient is not on any meds currently.  Patient went to urgent care and was noted to have EKG changes so sent here for further evaluation  The history is provided by the patient.       Home Medications Prior to Admission medications   Medication Sig Start Date End Date Taking? Authorizing Provider  aspirin EC 325 MG tablet Take 1 tablet (325 mg total) by mouth 2 (two) times daily after a meal. 08/14/16   Swinteck, Arlys Karsin, MD  docusate sodium (COLACE) 100 MG capsule Take 1 capsule (100 mg total) by mouth 2 (two) times daily. 08/14/16   Swinteck, Arlys Mickey, MD  HYDROcodone-acetaminophen (NORCO) 5-325 MG tablet Take 1-2 tablets by mouth every 4 (four) hours as needed for moderate pain. 08/14/16   Swinteck, Arlys Jerimah, MD  ondansetron (ZOFRAN) 4 MG tablet Take 1 tablet (4 mg total) by mouth every 8 (eight) hours as needed for nausea or vomiting. 08/14/16   Swinteck, Arlys Jacky, MD  senna (SENOKOT) 8.6 MG TABS tablet Take 2 tablets (17.2 mg total) by mouth at bedtime. 08/14/16   Samson Frederic, MD      Allergies    Patient has no known allergies.    Review of Systems   Review of Systems  Respiratory:  Positive for shortness of breath.   All other systems reviewed  and are negative.   Physical Exam Updated Vital Signs BP (!) 149/111   Pulse (!) 109   Temp 98 F (36.7 C) (Oral)   Resp 15   Ht 6\' 4"  (1.93 m)   Wt (!) 203.2 kg   SpO2 95%   BMI 54.53 kg/m  Physical Exam Vitals and nursing note reviewed.  Constitutional:      Comments: Uncomfortable  HENT:     Head: Normocephalic.     Mouth/Throat:     Mouth: Mucous membranes are moist.  Eyes:     Extraocular Movements: Extraocular movements intact.     Pupils: Pupils are equal, round, and reactive to light.  Cardiovascular:     Rate and Rhythm: Normal rate and regular rhythm.  Pulmonary:     Comments: Slightly tachypneic and diminished bilaterally.  No obvious wheezing Musculoskeletal:        General: Normal range of motion.     Cervical back: Normal range of motion and neck supple.  Skin:    General: Skin is warm.     Capillary Refill: Capillary refill takes less than 2 seconds.  Neurological:     General: No focal deficit present.  Psychiatric:        Mood and Affect: Mood normal.  Behavior: Behavior normal.     ED Results / Procedures / Treatments   Labs (all labs ordered are listed, but only abnormal results are displayed) Labs Reviewed  BASIC METABOLIC PANEL - Abnormal; Notable for the following components:      Result Value   CO2 21 (*)    Glucose, Bld 112 (*)    All other components within normal limits  BRAIN NATRIURETIC PEPTIDE - Abnormal; Notable for the following components:   B Natriuretic Peptide 406.6 (*)    All other components within normal limits  TROPONIN I (HIGH SENSITIVITY) - Abnormal; Notable for the following components:   Troponin I (High Sensitivity) 92 (*)    All other components within normal limits  TROPONIN I (HIGH SENSITIVITY) - Abnormal; Notable for the following components:   Troponin I (High Sensitivity) 119 (*)    All other components within normal limits  RESP PANEL BY RT-PCR (RSV, FLU A&B, COVID)  [id number]  CBC WITH  DIFFERENTIAL/PLATELET    EKG EKG Interpretation  Date/Time:  Friday May 09 2022 16:20:18 EST Ventricular Rate:  99 PR Interval:  186 QRS Duration: 89 QT Interval:  354 QTC Calculation: 455 R Axis:   178 Text Interpretation: Sinus rhythm Probable left atrial enlargement Anterior infarct, old Baseline wander in lead(s) V3 TWI new since previous Confirmed by Wandra Arthurs [phone]) on 05/09/2022 9:08:24 PM  Radiology DG Chest 2 View  Result Date: 05/09/2022 CLINICAL DATA:  Shortness of breath. EXAM: CHEST - 2 VIEW COMPARISON:  Chest radiographs 11/03/2012 FINDINGS: The cardiac silhouette is moderately enlarged which reflects a substantial increased from 2014. There are new symmetric hazy lung opacities bilaterally. No pleural effusion or pneumothorax is identified. A right shoulder arthroplasty is partially visualized. IMPRESSION: Increased cardiomegaly with new hazy lung opacities which could reflect edema, infection, or an inflammatory process. Electronically Signed   By: Logan Bores M.D.   On: 05/09/2022 16:46    Procedures Procedures    CRITICAL CARE Performed by: Wandra Arthurs   Total critical care time: 30 minutes  Critical care time was exclusive of separately billable procedures and treating other patients.  Critical care was necessary to treat or prevent imminent or life-threatening deterioration.  Critical care was time spent personally by me on the following activities: development of treatment plan with patient and/or surrogate as well as nursing, discussions with consultants, evaluation of patient's response to treatment, examination of patient, obtaining history from patient or surrogate, ordering and performing treatments and interventions, ordering and review of laboratory studies, ordering and review of radiographic studies, pulse oximetry and re-evaluation of patient's condition.   Medications Ordered in ED Medications  aspirin tablet 325 mg (325 mg Oral Given  05/09/22 2127)    ED Course/ Medical Decision Making/ A&P                           Medical Decision Making Jeffery Benson is a 46 y.o. male here presenting with shortness of breath with exertion.  Progressively getting worse over the last month or so.  Concern for possible unstable angina.  Plan to get CBC and CMP and troponin and chest x-ray  9:32 PM Chest x-ray showed mild pulmonary edema.  Patient's BNP is mildly elevated at 400 but he does not appear to be volume overloaded.  Initial troponin was 92 and went up to 119 on the second.  I am concerned for unstable angina vs NSTEMI.  Discussed case  with Dr. Marcelle Smiling, cardiology fellow.  He states that patient will need IV heparin and echo and most likely cardiac catheterization.  Since there is no beds at [facility], he recommend hospitalist admission and transfer to [facility] when we are able to.  Cardiology to see patient tomorrow.   Problems Addressed: Unstable angina [facility]): acute illness or injury  Amount and/or Complexity of Data Reviewed Labs: ordered. Decision-making details documented in ED Course. Radiology: ordered and independent interpretation performed. Decision-making details documented in ED Course. ECG/medicine tests: ordered.  Risk OTC drugs.    Final Clinical Impression(s) / ED Diagnoses Final diagnoses:  None    Rx / DC Orders ED Discharge Orders     None         Drenda Freeze, MD 05/09/22 2134

## 2022-05-09 NOTE — Assessment & Plan Note (Signed)
Mildly elevated troponin 92 > 119 > 109.  May be demand ischemia in setting of new diagnosis of acute CHF.  Did have anginal type symptoms prior to admission with nonspecific EKG changes. -Cardiology recommending admission to [facility]; may consider cardiac cath for ischemic eval -Continue IV heparin -Echocardiogram as above -Continue aspirin 81 mg daily

## 2022-05-09 NOTE — Assessment & Plan Note (Signed)
Presenting with DOE, orthopnea, peripheral edema, elevated BNP, pulmonary edema all consistent with new diagnosis of acute congestive heart failure.  Unspecified whether systolic or diastolic as no prior echo on file. -Start IV Lasix 40 mg BID -Obtain echocardiogram -Strict I/O's and daily weights -Beta-blocker once more compensated, further GDMT pending TTE results

## 2022-05-10 ENCOUNTER — Encounter (HOSPITAL_COMMUNITY): Payer: Self-pay | Admitting: Internal Medicine

## 2022-05-10 ENCOUNTER — Inpatient Hospital Stay (HOSPITAL_COMMUNITY): Payer: 59

## 2022-05-10 DIAGNOSIS — I5021 Acute systolic (congestive) heart failure: Secondary | ICD-10-CM

## 2022-05-10 DIAGNOSIS — I509 Heart failure, unspecified: Secondary | ICD-10-CM | POA: Diagnosis not present

## 2022-05-10 LAB — CBC
HCT: 42.3 % (ref 39.0–52.0)
Hemoglobin: 13.7 g/dL (ref 13.0–17.0)
MCH: 27.5 pg (ref 26.0–34.0)
MCHC: 32.4 g/dL (ref 30.0–36.0)
MCV: 84.9 fL (ref 80.0–100.0)
Platelets: 216 10*3/uL (ref 150–400)
RBC: 4.98 MIL/uL (ref 4.22–5.81)
RDW: 13.8 % (ref 11.5–15.5)
WBC: 8.9 10*3/uL (ref 4.0–10.5)
nRBC: 0 % (ref 0.0–0.2)

## 2022-05-10 LAB — HEPARIN LEVEL (UNFRACTIONATED)
Heparin Unfractionated: <0.1 IU/mL — ABNORMAL LOW (ref 0.30–0.70)
Heparin Unfractionated: 0.26 IU/mL — ABNORMAL LOW (ref 0.30–0.70)
Heparin Unfractionated: 0.21 IU/mL — ABNORMAL LOW (ref 0.30–0.70)

## 2022-05-10 LAB — BASIC METABOLIC PANEL
Anion gap: 6 (ref 5–15)
BUN: 15 mg/dL (ref 6–20)
CO2: 26 mmol/L (ref 22–32)
Calcium: 8.7 mg/dL — ABNORMAL LOW (ref 8.9–10.3)
Chloride: 107 mmol/L (ref 98–111)
Creatinine, Ser: 1.09 mg/dL (ref 0.61–1.24)
GFR, Estimated: >60 mL/min (ref >60)
Glucose, Bld: 116 mg/dL — ABNORMAL HIGH (ref 70–99)
Potassium: 3.7 mmol/L (ref 3.5–5.1)
Sodium: 139 mmol/L (ref 135–145)

## 2022-05-10 LAB — ECHOCARDIOGRAM COMPLETE
Area-P 1/2: 6.37 cm2
Height: 76 in
S' Lateral: 6.2 cm
Weight: 7168 oz

## 2022-05-10 LAB — TROPONIN I (HIGH SENSITIVITY): Troponin I (High Sensitivity): 57 ng/L — ABNORMAL HIGH (ref <18)

## 2022-05-10 LAB — MAGNESIUM: Magnesium: 2 mg/dL (ref 1.7–2.4)

## 2022-05-10 LAB — HIV ANTIBODY (ROUTINE TESTING W REFLEX): HIV Screen 4th Generation wRfx: NONREACTIVE

## 2022-05-10 MED ORDER — HEPARIN BOLUS VIA INFUSION
4000.0000 [IU] | Freq: Once | INTRAVENOUS | Status: AC
  Administered 2022-05-10: 4000 [IU] via INTRAVENOUS
  Filled 2022-05-10: qty 4000

## 2022-05-10 MED ORDER — SPIRONOLACTONE 12.5 MG HALF TABLET
12.5000 mg | ORAL_TABLET | Freq: Every day | ORAL | Status: DC
  Administered 2022-05-10 – 2022-05-13 (×4): 12.5 mg via ORAL
  Filled 2022-05-10 (×4): qty 1

## 2022-05-10 MED ORDER — SACUBITRIL-VALSARTAN 24-26 MG PO TABS
1.0000 | ORAL_TABLET | Freq: Two times a day (BID) | ORAL | Status: DC
  Administered 2022-05-10 – 2022-05-13 (×7): 1 via ORAL
  Filled 2022-05-10 (×8): qty 1

## 2022-05-10 MED ORDER — DAPAGLIFLOZIN PROPANEDIOL 10 MG PO TABS
10.0000 mg | ORAL_TABLET | Freq: Every day | ORAL | Status: DC
  Administered 2022-05-10 – 2022-05-12 (×3): 10 mg via ORAL
  Filled 2022-05-10 (×4): qty 1

## 2022-05-10 MED ORDER — HEPARIN BOLUS VIA INFUSION
2000.0000 [IU] | Freq: Once | INTRAVENOUS | Status: AC
  Administered 2022-05-10: 2000 [IU] via INTRAVENOUS
  Filled 2022-05-10: qty 2000

## 2022-05-10 MED ORDER — PERFLUTREN LIPID MICROSPHERE
1.0000 mL | INTRAVENOUS | Status: AC | PRN
  Administered 2022-05-10: 5.5 mL via INTRAVENOUS

## 2022-05-10 NOTE — Progress Notes (Signed)
ANTICOAGULATION CONSULT NOTE - follow up  Pharmacy Consult for Heparin Indication: chest pain/ACS  No Known Allergies  Patient Measurements: Height: 6\' 4"  (193 cm) Weight: (!) 203.2 kg (448 lb) IBW/kg (Calculated) : 86.8 Heparin Dosing Weight:  137 kg  Vital Signs: Temp: 98.1 F (36.7 C) (12/30 0333) Temp Source: Oral (12/30 0333) BP: 145/108 (12/30 0400) Pulse Rate: 97 (12/30 0400)  Labs: Recent Labs    05/09/22 1736 05/09/22 2008 05/09/22 2121 05/10/22 0100 05/10/22 0500  HGB 14.3  --   --   --  13.7  HCT 43.5  --   --   --  42.3  PLT 225  --   --   --  216  HEPARINUNFRC  --   --   --   --  <0.10*  CREATININE 1.06  --   --   --   --   TROPONINIHS 92* 119* 109* 57*  --      Estimated Creatinine Clearance: 160.8 mL/min (by C-G formula based on SCr of 1.06 mg/dL).   Medical History: Past Medical History:  Diagnosis Date   History of chickenpox    Assessment: SOB AC/Heme: ACS. +troponins. Baseline CBC WNL. Start IV heparin.  05/10/2022 HL < 0.1 subtherapeutic on 2200 units/hr CBC WNL Trop 57 No bleeding reported  Goal of Therapy:  Heparin level 0.3-0.7 units/ml Monitor platelets by anticoagulation protocol: Yes   Plan:  IV heparin 4000 unit bolus x1 Increase heparin drip to 2600 units/hr Check heparin level in 6-8 hr Daily HL and CBC  05/12/2022 RPh 05/10/2022, 5:35 AM

## 2022-05-10 NOTE — ED Notes (Signed)
ED TO INPATIENT HANDOFF REPORT  ED Nurse Name and Phone #: Izola Price [phone]  S Name/Age/Gender Jeffery Benson 46 y.o. male Room/Bed: WA05/WA05  Code Status   Code Status: Full Code  Home/SNF/Other [facility] Patient oriented to: self Is this baseline? Yes   Triage Complete: Triage complete  Chief Complaint Elevated troponin [R79.89]  Triage Note Patient reports shortness of breath starting about 1 month ago and gradually getting worse. Last night playing tennis and could not breathe well. Denies pain in triage. O2 sat in triage 98% on RA.  Witnessed cough in triage. Denies and phlegm.    Allergies No Known Allergies  Level of Care/Admitting Diagnosis ED Disposition     ED Disposition  Admit   Condition  --   Comment  Hospital Area: [facility] [[id number]]  Level of Care: Telemetry Cardiac [103]  May [facility] patient to [facility] or [facility] if equivalent level of care is available:: No  Covid Evaluation: Symptomatic Person Under Investigation (PUI) or recent exposure (last 10 days) *Testing Required*  Diagnosis: Elevated troponin [321909]  Admitting Physician: Lenore Cordia [id number]  Attending Physician: Lenore Cordia [id number]  Certification:: I certify this patient will need inpatient services for at least 2 midnights  Estimated Length of Stay: 2          B Medical/Surgery History Past Medical History:  Diagnosis Date   History of chickenpox    Hypertension    Past Surgical History:  Procedure Laterality Date   ACHILLES TENDON REPAIR  2011   [place] Right 08/14/2016   Procedure: REPAIR QUADRICEP TENDON;  Surgeon: Rod Can, MD;  Location: [facility];  Service: Orthopedics;  Laterality: Right;   SHOULDER HEMI-ARTHROPLASTY Right 11/09/2012   Procedure: RIGHT SHOULDER HEMI-ARTHROPLASTY;  Surgeon: Nita Sells, MD;  Location: [facility];  Service: Orthopedics;  Laterality: Right;     A IV  Location/Drains/Wounds Patient Lines/Drains/Airways Status     Active Line/Drains/Airways     Name Placement date Placement time Site Days   Peripheral IV 05/09/22 18 G Anterior;Left Forearm 05/09/22  2117  Forearm  1   Peripheral IV 05/09/22 18 G Right Antecubital 05/09/22  2148  Antecubital  1   Incision (Closed) 08/14/16 Knee Right 08/14/16  1830  -- 2095            Intake/Output Last 24 hours No intake or output data in the 24 hours ending 05/10/22 1446  Labs/Imaging Results for orders placed or performed during the hospital encounter of 05/09/22 (from the past 48 hour(s))  Basic metabolic panel     Status: Abnormal   Collection Time: 05/09/22  5:36 PM  Result Value Ref Range   Sodium 139 135 - 145 mmol/L   Potassium 4.0 3.5 - 5.1 mmol/L   Chloride 110 98 - 111 mmol/L   CO2 21 (L) 22 - 32 mmol/L   Glucose, Bld 112 (H) 70 - 99 mg/dL    Comment: Glucose reference range applies only to samples taken after fasting for at least 8 hours.   BUN 15 6 - 20 mg/dL   Creatinine, Ser 1.06 0.61 - 1.24 mg/dL   Calcium 9.2 8.9 - 10.3 mg/dL   GFR, Estimated >60 >60 mL/min    Comment: (NOTE) Calculated using the CKD-EPI Creatinine Equation (2021)    Anion gap 8 5 - 15    Comment: Performed at [facility], [place] [address], [place], [place] [address]  Brain natriuretic peptide  Status: Abnormal   Collection Time: 05/09/22  5:36 PM  Result Value Ref Range   B Natriuretic Peptide 406.6 (H) 0.0 - 100.0 pg/mL    Comment: Performed at [facility], [place] [address] [place], [place] [address]  CBC with Differential     Status: None   Collection Time: 05/09/22  5:36 PM  Result Value Ref Range   WBC 9.4 4.0 - 10.5 K/uL    Comment: WHITE COUNT CONFIRMED ON SMEAR   RBC 5.09 4.22 - 5.81 MIL/uL   Hemoglobin 14.3 13.0 - 17.0 g/dL   HCT 43.5 39.0 - 52.0 %   MCV 85.5 80.0 - 100.0 fL   MCH 28.1 26.0 - 34.0 pg   MCHC 32.9 30.0 - 36.0 g/dL   RDW 13.8 11.5  - 15.5 %   Platelets 225 150 - 400 K/uL   nRBC 0.0 0.0 - 0.2 %   Neutrophils Relative % 61 %   Neutro Abs 5.8 1.7 - 7.7 K/uL   Lymphocytes Relative 24 %   Lymphs Abs 2.3 0.7 - 4.0 K/uL   Monocytes Relative 8 %   Monocytes Absolute 0.8 0.1 - 1.0 K/uL   Eosinophils Relative 5 %   Eosinophils Absolute 0.5 0.0 - 0.5 K/uL   Basophils Relative 2 %   Basophils Absolute 0.1 0.0 - 0.1 K/uL   WBC Morphology VACUOLATED NEUTROPHILS    Immature Granulocytes 0 %   Abs Immature Granulocytes 0.03 0.00 - 0.07 K/uL    Comment: Performed at [facility], [place] [address], [place], [place] [address]  Troponin I (High Sensitivity)     Status: Abnormal   Collection Time: 05/09/22  5:36 PM  Result Value Ref Range   Troponin I (High Sensitivity) 92 (H) <18 ng/L    Comment: (NOTE) Elevated high sensitivity troponin I (hsTnI) values and significant  changes across serial measurements may suggest ACS but many other  chronic and acute conditions are known to elevate hsTnI results.  Refer to the "Links" section for chest pain algorithms and additional  guidance. Performed at [facility], [place] [address], [place], [place] [address]   Troponin I (High Sensitivity)     Status: Abnormal   Collection Time: 05/09/22  8:08 PM  Result Value Ref Range   Troponin I (High Sensitivity) 119 (HH) <18 ng/L    Comment: CRITICAL RESULT CALLED TO, READ BACK BY AND VERIFIED WITH TALKINGTON,J. 05/09/22 @2054  BY SEEL,M. (NOTE) Elevated high sensitivity troponin I (hsTnI) values and significant  changes across serial measurements may suggest ACS but many other  chronic and acute conditions are known to elevate hsTnI results.  Refer to the "Links" section for chest pain algorithms and additional  guidance. Performed at [facility], [place] [address], [place], [place] [address]   Resp panel by RT-PCR (RSV, Flu A&B, Covid) Anterior Nasal Swab     Status: None   Collection  Time: 05/09/22  9:17 PM   Specimen: Anterior Nasal Swab  Result Value Ref Range   SARS Coronavirus 2 by RT PCR NEGATIVE NEGATIVE    Comment: (NOTE) SARS-CoV-2 target nucleic acids are NOT DETECTED.  The SARS-CoV-2 RNA is generally detectable in upper respiratory specimens during the acute phase of infection. The lowest concentration of SARS-CoV-2 viral copies this assay can detect is 138 copies/mL. A negative result does not preclude SARS-Cov-2 infection and should not be used as the sole basis for treatment or other patient management decisions. A negative  result may occur with  improper specimen collection/handling, submission of specimen other than nasopharyngeal swab, presence of viral mutation(s) within the areas targeted by this assay, and inadequate number of viral copies(<138 copies/mL). A negative result must be combined with clinical observations, patient history, and epidemiological information. The expected result is Negative.  Fact Sheet for Patients:  EntrepreneurPulse.com.au  Fact Sheet for Healthcare Providers:  IncredibleEmployment.be  This test is no t yet approved or cleared by the [place] FDA and  has been authorized for detection and/or diagnosis of SARS-CoV-2 by FDA under an Emergency Use Authorization (EUA). This EUA will remain  in effect (meaning this test can be used) for the duration of the COVID-19 declaration under Section 564(b)(1) of the Act, 21 U.S.C.section 360bbb-3(b)(1), unless the authorization is terminated  or revoked sooner.       Influenza A by PCR NEGATIVE NEGATIVE   Influenza B by PCR NEGATIVE NEGATIVE    Comment: (NOTE) The Xpert Xpress SARS-CoV-2/FLU/RSV plus assay is intended as an aid in the diagnosis of influenza from Nasopharyngeal swab specimens and should not be used as a sole basis for treatment. Nasal washings and aspirates are unacceptable for Xpert Xpress  SARS-CoV-2/FLU/RSV testing.  Fact Sheet for Patients: EntrepreneurPulse.com.au  Fact Sheet for Healthcare Providers: IncredibleEmployment.be  This test is not yet approved or cleared by the [place] FDA and has been authorized for detection and/or diagnosis of SARS-CoV-2 by FDA under an Emergency Use Authorization (EUA). This EUA will remain in effect (meaning this test can be used) for the duration of the COVID-19 declaration under Section 564(b)(1) of the Act, 21 U.S.C. section 360bbb-3(b)(1), unless the authorization is terminated or revoked.     Resp Syncytial Virus by PCR NEGATIVE NEGATIVE    Comment: (NOTE) Fact Sheet for Patients: EntrepreneurPulse.com.au  Fact Sheet for Healthcare Providers: IncredibleEmployment.be  This test is not yet approved or cleared by the [place] FDA and has been authorized for detection and/or diagnosis of SARS-CoV-2 by FDA under an Emergency Use Authorization (EUA). This EUA will remain in effect (meaning this test can be used) for the duration of the COVID-19 declaration under Section 564(b)(1) of the Act, 21 U.S.C. section 360bbb-3(b)(1), unless the authorization is terminated or revoked.  Performed at [facility], [place] [address], [place], [place] [address]   Troponin I (High Sensitivity)     Status: Abnormal   Collection Time: 05/09/22  9:21 PM  Result Value Ref Range   Troponin I (High Sensitivity) 109 (HH) <18 ng/L    Comment: CRITICAL VALUE NOTED. VALUE IS CONSISTENT WITH PREVIOUSLY REPORTED/CALLED VALUE (NOTE) Elevated high sensitivity troponin I (hsTnI) values and significant  changes across serial measurements may suggest ACS but many other  chronic and acute conditions are known to elevate hsTnI results.  Refer to the "Links" section for chest pain algorithms and additional  guidance. Performed at [facility], [place] [address], [place], [place] [address]   Troponin I (High Sensitivity)     Status: Abnormal   Collection Time: 05/10/22  1:00 AM  Result Value Ref Range   Troponin I (High Sensitivity) 57 (H) <18 ng/L    Comment: DELTA CHECK NOTED (NOTE) Elevated high sensitivity troponin I (hsTnI) values and significant  changes across serial measurements may suggest ACS but many other  chronic and acute conditions are known to elevate hsTnI results.  Refer to the "Links" section for chest pain algorithms and additional  guidance. Performed at [facility], [place] [address], [place], [place] [address]   CBC     Status: None   Collection Time: 05/10/22  5:00 AM  Result Value Ref Range   WBC 8.9 4.0 - 10.5 K/uL   RBC 4.98 4.22 - 5.81 MIL/uL   Hemoglobin 13.7 13.0 - 17.0 g/dL   HCT 42.3 39.0 - 52.0 %   MCV 84.9 80.0 - 100.0 fL   MCH 27.5 26.0 - 34.0 pg   MCHC 32.4 30.0 - 36.0 g/dL   RDW 13.8 11.5 - 15.5 %   Platelets 216 150 - 400 K/uL   nRBC 0.0 0.0 - 0.2 %    Comment: Performed at [facility], [place] [address] [place], [place] [address]  Heparin level (unfractionated)     Status: Abnormal   Collection Time: 05/10/22  5:00 AM  Result Value Ref Range   Heparin Unfractionated <0.10 (L) 0.30 - 0.70 IU/mL    Comment: (NOTE) The clinical reportable range upper limit is being lowered to >1.10 to align with the FDA approved guidance for the current laboratory assay.  If heparin results are below expected values, and patient dosage has  been confirmed, suggest follow up testing of antithrombin III levels. Performed at [facility], [place] [address] [place], [place] [address]   HIV Antibody (routine testing w rflx)     Status: None   Collection Time: 05/10/22  5:00 AM  Result Value Ref Range   HIV Screen 4th Generation wRfx Non Reactive Non Reactive    Comment: Performed at [facility], [place] [address], [place],  [place] [address]  Magnesium     Status: None   Collection Time: 05/10/22  5:00 AM  Result Value Ref Range   Magnesium 2.0 1.7 - 2.4 mg/dL    Comment: Performed at [facility], [place] [address], [place], [place] [id number]  Basic metabolic panel     Status: Abnormal   Collection Time: 05/10/22  5:00 AM  Result Value Ref Range   Sodium 139 135 - 145 mmol/L   Potassium 3.7 3.5 - 5.1 mmol/L   Chloride 107 98 - 111 mmol/L   CO2 26 22 - 32 mmol/L   Glucose, Bld 116 (H) 70 - 99 mg/dL    Comment: Glucose reference range applies only to samples taken after fasting for at least 8 hours.   BUN 15 6 - 20 mg/dL   Creatinine, Ser 1.09 0.61 - 1.24 mg/dL   Calcium 8.7 (L) 8.9 - 10.3 mg/dL   GFR, Estimated >60 >60 mL/min    Comment: (NOTE) Calculated using the CKD-EPI Creatinine Equation (2021)    Anion gap 6 5 - 15    Comment: Performed at [facility], [place] [address] [place], [place] [address]  Heparin level (unfractionated)     Status: Abnormal   Collection Time: 05/10/22 12:00 PM  Result Value Ref Range   Heparin Unfractionated 0.26 (L) 0.30 - 0.70 IU/mL    Comment: (NOTE) The clinical reportable range upper limit is being lowered to >1.10 to align with the FDA approved guidance for the current laboratory assay.  If heparin results are below expected values, and patient dosage has  been confirmed, suggest follow up testing of antithrombin III levels. Performed at [facility], [place] [address] [place], [place] [address]    ECHOCARDIOGRAM COMPLETE  Result Date: 05/10/2022    ECHOCARDIOGRAM REPORT   Patient Name:   Jeffery Benson  Date of Exam: 05/10/2022 Medical Rec #:  [id number]      Height:       76.0 in Accession #:    [id number]     Weight:       448.0 lb Date of Birth:  [birth year]       BSA:          3.121 m Patient Age:    13 years       BP:           145/99 mmHg Patient Gender: M              HR:           92 bpm. Exam Location:   Inpatient Procedure: 2D Echo, Cardiac Doppler, Color Doppler and Intracardiac            Opacification Agent Indications:    Congestive Heart Failure I50.9  History:        Patient has no prior history of Echocardiogram examinations.                 CHF.  Sonographer:    Bernadene Person RDCS Referring Phys: [id number] Cleaster Corin PATEL  Sonographer Comments: Technically difficult study due to poor echo windows and patient is obese. IMPRESSIONS  1. Left ventricular ejection fraction, by estimation, is 20 to 25%. The left ventricle has severely decreased function. The left ventricle demonstrates global hypokinesis. The left ventricular internal cavity size was mildly dilated. Left ventricular diastolic parameters are consistent with Grade II diastolic dysfunction (pseudonormalization).  2. Right ventricular systolic function is normal. The right ventricular size is normal.  3. Left atrial size was severely dilated.  4. The mitral valve is normal in structure. Mild mitral valve regurgitation. No evidence of mitral stenosis.  5. The aortic valve is tricuspid. Aortic valve regurgitation is not visualized. No aortic stenosis is present.  6. The inferior vena cava is dilated in size with <50% respiratory variability, suggesting right atrial pressure of 15 mmHg. FINDINGS  Left Ventricle: Left ventricular ejection fraction, by estimation, is 20 to 25%. The left ventricle has severely decreased function. The left ventricle demonstrates global hypokinesis. Definity contrast agent was given IV to delineate the left ventricular endocardial borders. The left ventricular internal cavity size was mildly dilated. There is no left ventricular hypertrophy. Left ventricular diastolic parameters are consistent with Grade II diastolic dysfunction (pseudonormalization). Right Ventricle: The right ventricular size is normal. No increase in right ventricular wall thickness. Right ventricular systolic function is normal. Left Atrium: Left atrial  size was severely dilated. Right Atrium: Right atrial size was normal in size. Pericardium: There is no evidence of pericardial effusion. Mitral Valve: The mitral valve is normal in structure. Mild mitral valve regurgitation, with posteriorly-directed jet. No evidence of mitral valve stenosis. Tricuspid Valve: The tricuspid valve is normal in structure. Tricuspid valve regurgitation is not demonstrated. No evidence of tricuspid stenosis. Aortic Valve: The aortic valve is tricuspid. Aortic valve regurgitation is not visualized. No aortic stenosis is present. Pulmonic Valve: The pulmonic valve was normal in structure. Pulmonic valve regurgitation is not visualized. No evidence of pulmonic stenosis. Aorta: The aortic root is normal in size and structure. Venous: The inferior vena cava is dilated in size with less than 50% respiratory variability, suggesting right atrial pressure of 15 mmHg. IAS/Shunts: No atrial level shunt detected by color flow Doppler.  LEFT VENTRICLE PLAX 2D LVIDd:         6.90 cm   Diastology LVIDs:  6.20 cm   LV e' medial:    3.78 cm/s LV PW:         1.10 cm   LV E/e' medial:  21.7 LV IVS:        1.00 cm   LV e' lateral:   5.98 cm/s LVOT diam:     2.40 cm   LV E/e' lateral: 13.7 LV SV:         48 LV SV Index:   15 LVOT Area:     4.52 cm  RIGHT VENTRICLE RV S prime:     7.35 cm/s TAPSE (M-mode): 1.6 cm LEFT ATRIUM              Index        RIGHT ATRIUM           Index LA diam:        5.50 cm  1.76 cm/m   RA Area:     30.50 cm LA Vol (A2C):   177.0 ml 56.72 ml/m  RA Volume:   96.60 ml  30.95 ml/m LA Vol (A4C):   174.0 ml 55.75 ml/m LA Biplane Vol: 176.0 ml 56.39 ml/m  AORTIC VALVE LVOT Vmax:   77.13 cm/s LVOT Vmean:  48.600 cm/s LVOT VTI:    0.107 m  AORTA Ao Root diam: 3.40 cm Ao Asc diam:  3.80 cm MITRAL VALVE MV Area (PHT): 6.37 cm    SHUNTS MV Decel Time: 119 msec    Systemic VTI:  0.11 m MV E velocity: 82.00 cm/s  Systemic Diam: 2.40 cm MV A velocity: 53.80 cm/s MV E/A ratio:   1.52 Candee Furbish MD Electronically signed by Candee Furbish MD Signature Date/Time: 05/10/2022/12:07:30 PM    Final    DG Chest 2 View  Result Date: 05/09/2022 CLINICAL DATA:  Shortness of breath. EXAM: CHEST - 2 VIEW COMPARISON:  Chest radiographs 11/03/2012 FINDINGS: The cardiac silhouette is moderately enlarged which reflects a substantial increased from 2014. There are new symmetric hazy lung opacities bilaterally. No pleural effusion or pneumothorax is identified. A right shoulder arthroplasty is partially visualized. IMPRESSION: Increased cardiomegaly with new hazy lung opacities which could reflect edema, infection, or an inflammatory process. Electronically Signed   By: Logan Bores M.D.   On: 05/09/2022 16:46    Pending Labs Unresulted Labs (From admission, onward)     Start     Ordered   05/11/22 [id number]  Basic metabolic panel  Tomorrow morning,   R        05/10/22 0944   05/10/22 1900  Heparin level (unfractionated)  Once,   R        05/10/22 1318   05/10/22 0500  CBC  Daily,   R      05/09/22 2132            Vitals/Pain Today's Vitals   05/10/22 0735 05/10/22 1059 05/10/22 1100 05/10/22 1417  BP:  (!) 159/117 (!) 159/117 (!) 140/103  Pulse:  95 91 94  Resp:  18 20 16   Temp: 97.9 F (36.6 C)  97.7 F (36.5 C) 97.7 F (36.5 C)  TempSrc: Oral  Oral Oral  SpO2:  94% 92% 96%  Weight:      Height:      PainSc:        Isolation Precautions No active isolations  Medications Medications  heparin ADULT infusion 100 units/mL (25000 units/217mL) (2,800 Units/hr Intravenous Rate/Dose Change 05/10/22 1307)  furosemide (LASIX) injection 40 mg (40 mg Intravenous Given  05/10/22 0954)  sodium chloride flush (NS) 0.9 % injection 3 mL (3 mLs Intravenous Given 05/09/22 2244)  acetaminophen (TYLENOL) tablet 650 mg (has no administration in time range)    Or  acetaminophen (TYLENOL) suppository 650 mg (has no administration in time range)  ondansetron (ZOFRAN) tablet 4 mg (has no  administration in time range)    Or  ondansetron (ZOFRAN) injection 4 mg (has no administration in time range)  senna-docusate (Senokot-S) tablet 1 tablet (has no administration in time range)  aspirin chewable tablet 81 mg (81 mg Oral Given 05/10/22 0954)  hydrALAZINE (APRESOLINE) injection 10 mg (has no administration in time range)  perflutren lipid microspheres (DEFINITY) IV suspension (5.5 mLs Intravenous Given 05/10/22 0841)  dapagliflozin propanediol (FARXIGA) tablet 10 mg (10 mg Oral Given 05/10/22 1058)  spironolactone (ALDACTONE) tablet 12.5 mg (12.5 mg Oral Given 05/10/22 1058)  sacubitril-valsartan (ENTRESTO) 24-26 mg per tablet (1 tablet Oral Given 05/10/22 1058)  aspirin tablet 325 mg (325 mg Oral Given 05/09/22 2127)  heparin bolus via infusion 4,000 Units (4,000 Units Intravenous Bolus from Bag 05/09/22 2154)  heparin bolus via infusion 4,000 Units (4,000 Units Intravenous Bolus from Bag 05/10/22 0540)    Mobility walks Low fall risk   Focused Assessments 6E   R Recommendations: See Admitting Provider Note  Report given to:   Additional Notes: -

## 2022-05-10 NOTE — Progress Notes (Signed)
ANTICOAGULATION CONSULT NOTE - follow up  Pharmacy Consult for Heparin Indication: chest pain/ACS  No Known Allergies  Patient Measurements: Height: 6\' 4"  (193 cm) Weight: (!) 203.2 kg (448 lb) IBW/kg (Calculated) : 86.8 Heparin Dosing Weight:  137 kg  Vital Signs: Temp: 97.7 F (36.5 C) (12/30 1100) Temp Source: Oral (12/30 1100) BP: 159/117 (12/30 1100) Pulse Rate: 91 (12/30 1100)  Labs: Recent Labs    05/09/22 1736 05/09/22 2008 05/09/22 2121 05/10/22 0100 05/10/22 0500 05/10/22 1200  HGB 14.3  --   --   --  13.7  --   HCT 43.5  --   --   --  42.3  --   PLT 225  --   --   --  216  --   HEPARINUNFRC  --   --   --   --  <0.10* 0.26*  CREATININE 1.06  --   --   --  1.09  --   TROPONINIHS 92* 119* 109* 57*  --   --      Estimated Creatinine Clearance: 156.4 mL/min (by C-G formula based on SCr of 1.09 mg/dL).   Medical History: Past Medical History:  Diagnosis Date   History of chickenpox    Hypertension    Assessment: SOB AC/Heme: ACS. +troponins. Baseline CBC WNL. Start IV heparin.  05/10/2022 HL is 0.26 subtherapeutic  on 2600 units/hr CBC WNL Trop 57 No bleeding or line issues  reported  Goal of Therapy:  Heparin level 0.3-0.7 units/ml Monitor platelets by anticoagulation protocol: Yes   Plan:  Increase heparin drip to 2800 units/hr Check heparin level in 6-8 hr Daily HL and CBC Monitor for signs and symptoms of bleeding     05/12/2022, PharmD, BCPS 05/10/2022 1:00 PM

## 2022-05-10 NOTE — Progress Notes (Signed)
Echocardiogram 2D Echocardiogram has been performed.  Augustine Radar 05/10/2022, 8:40 AM

## 2022-05-10 NOTE — Progress Notes (Signed)
ANTICOAGULATION CONSULT NOTE  Pharmacy Consult for heparin Indication: chest pain/ACS  No Known Allergies  Patient Measurements: Height: 6\' 4"  (193 cm) Weight: (!) 203.2 kg (448 lb) IBW/kg (Calculated) : 86.8 Heparin Dosing Weight: 137 kg  Vital Signs: Temp: 98.2 F (36.8 C) (12/30 1807) Temp Source: Oral (12/30 1807) BP: 115/96 (12/30 1807) Pulse Rate: 102 (12/30 1807)  Labs: Recent Labs    05/09/22 1736 05/09/22 2008 05/09/22 2121 05/10/22 0100 05/10/22 0500 05/10/22 1200 05/10/22 1930  HGB 14.3  --   --   --  13.7  --   --   HCT 43.5  --   --   --  42.3  --   --   PLT 225  --   --   --  216  --   --   HEPARINUNFRC  --   --   --   --  <0.10* 0.26* 0.21*  CREATININE 1.06  --   --   --  1.09  --   --   TROPONINIHS 92* 119* 109* 57*  --   --   --     Estimated Creatinine Clearance: 156.4 mL/min (by C-G formula based on SCr of 1.09 mg/dL).   Medications:  Infusions:   heparin 2,800 Units/hr (05/10/22 1307)    Assessment: 46 yom who presented to the ED with SOB. Pharmacy consulted to manage IV heparin for ACS. Plan is for [facility] as HF improves.  Heparin level is subtherapeutic at 0.21 on 2800 units/hr. No bleeding noted, CBC is normal. No problems with infusion or bleeding per RN.  Goal of Therapy:  Heparin level 0.3-0.7 units/ml Monitor platelets by anticoagulation protocol: Yes   Plan:  Heparin 2000 unit IV bolus then increase infusion to 3100 units/hr 6 hr heparin level Daily heparin level and CBC Monitor for signs/symptoms of bleeding  Thank you for involving pharmacy in this patient's care.  [facility], PharmD, BCPS Clinical Pharmacist Clinical phone for 05/10/2022 is [phone] 05/10/2022 8:38 PM

## 2022-05-10 NOTE — Consult Note (Signed)
Cardiology Consultation   Patient ID: BRONNER MACGILL MRN: [id number]; DOB: 1975/10/31  Admit date: 05/09/2022 Date of Consult: 05/10/2022  PCP:  Artist Pais, Doe-Hyun R, DO (Inactive)   [facility] Providers Cardiologist:  New   Patient Profile:   Jeffery Benson is a 46 y.o. male with a hx of hypertension who is being seen 05/10/2022 for the evaluation of acute systolic congestive heart failure at the request of Rhetta Mura MD.  History of Present Illness:   Patient has no prior cardiac history.  He does not typically see physicians.  Over the past 3 months he has noticed progressive fatigue.  Over the past 1 month he has noted worsening dyspnea on exertion and occasional orthopnea.  Minimal pedal edema.  He was playing tennis and developed severe shortness of breath and a "gripping" in his chest for 5 to 10 minutes which resolved with rest.  He otherwise has not had exertional chest pain, palpitations or syncope.  He was admitted and cardiology asked to evaluate.   Past Medical History:  Diagnosis Date   History of chickenpox    Hypertension     Past Surgical History:  Procedure Laterality Date   ACHILLES TENDON REPAIR  2011   QUADRICEPS TENDON REPAIR Right 08/14/2016   Procedure: REPAIR QUADRICEP TENDON;  Surgeon: Samson Frederic, MD;  Location: [facility] ORS;  Service: Orthopedics;  Laterality: Right;   SHOULDER HEMI-ARTHROPLASTY Right 11/09/2012   Procedure: RIGHT SHOULDER HEMI-ARTHROPLASTY;  Surgeon: Mable Paris, MD;  Location: [facility];  Service: Orthopedics;  Laterality: Right;      Inpatient Medications: Scheduled Meds:  aspirin  81 mg Oral Daily   furosemide  40 mg Intravenous Q12H   potassium chloride  10 mEq Oral Daily   sodium chloride flush  3 mL Intravenous Q12H   Continuous Infusions:  heparin 2,600 Units/hr (05/10/22 0541)   PRN Meds: acetaminophen **OR** acetaminophen, hydrALAZINE, ondansetron **OR** ondansetron (ZOFRAN) IV, perflutren lipid  microspheres (DEFINITY) IV suspension, senna-docusate  Allergies:   No Known Allergies  Social History:   Social History   Socioeconomic History   Marital status: Married    Spouse name: Not on file   Number of children: 2   Years of education: Not on file   Highest education level: Not on file  Occupational History   Not on file  Tobacco Use   Smoking status: Never   Smokeless tobacco: Never  Substance and Sexual Activity   Alcohol use: Yes    Comment: rare   Drug use: No   Sexual activity: Yes  Other Topics Concern   Not on file  Social History Narrative   Not on file   Social Determinants of Health   Financial Resource Strain: Not on file  Food Insecurity: Not on file  Transportation Needs: Not on file  Physical Activity: Not on file  Stress: Not on file  Social Connections: Not on file  Intimate Partner Violence: Not on file    Family History:    Family History  Problem Relation Age of Onset   Cancer Mother        bladder & ovarian   Diabetes Mother    Cancer Father        colon   Depression Maternal Aunt    Depression Maternal Grandmother    Alcohol abuse Paternal Grandfather      ROS:  Please see the history of present illness.  Fatigue and dry cough. All other ROS reviewed and negative.  Physical Exam/Data:   Vitals:   05/10/22 0400 05/10/22 0630 05/10/22 0700 05/10/22 0735  BP: (!) 145/108 (!) 149/105    Pulse: 97 85    Resp: (!) 22 20 20    Temp:    97.9 F (36.6 C)  TempSrc:    Oral  SpO2: (!) 87% 96%    Weight:      Height:       No intake or output data in the 24 hours ending 05/10/22 0937    05/09/2022    4:23 PM 08/14/2016    2:24 PM 08/12/2016    8:09 AM  Last 3 Weights  Weight (lbs) 448 lb 406 lb 9 oz 406 lb 9.6 oz  Weight (kg) 203.211 kg 184.416 kg 184.433 kg     Body mass index is 54.53 kg/m.  General:  Obese, well developed, in no acute distress HEENT: normal Neck: no JVD Vascular: No carotid bruits; Distal pulses  2+ bilaterally Cardiac:  normal S1, S2; RRR; no murmur  Lungs:  clear to auscultation bilaterally, no wheezing, rhonchi or rales  Abd: soft, nontender, no hepatomegaly  Ext: trace edema Musculoskeletal:  No deformities, BUE and BLE strength normal and equal Skin: warm and dry  Neuro:  CNs 2-12 intact, no focal abnormalities noted Psych:  Normal affect   EKG:  The EKG was personally reviewed and demonstrates: Normal sinus rhythm, cannot rule out anterior infarct.   Laboratory Data:  High Sensitivity Troponin:   Recent Labs  Lab 05/09/22 1736 05/09/22 2008 05/09/22 2121 05/10/22 0100  TROPONINIHS 92* 119* 109* 57*     Chemistry Recent Labs  Lab 05/09/22 1736 05/10/22 0500  NA 139 139  K 4.0 3.7  CL 110 107  CO2 21* 26  GLUCOSE 112* 116*  BUN 15 15  CREATININE 1.06 1.09  CALCIUM 9.2 8.7*  MG  --  2.0  GFRNONAA >60 >60  ANIONGAP 8 6     Hematology Recent Labs  Lab 05/09/22 1736 05/10/22 0500  WBC 9.4 8.9  RBC 5.09 4.98  HGB 14.3 13.7  HCT 43.5 42.3  MCV 85.5 84.9  MCH 28.1 27.5  MCHC 32.9 32.4  RDW 13.8 13.8  PLT 225 216    BNP Recent Labs  Lab 05/09/22 1736  BNP 406.6*       Radiology/Studies:  DG Chest 2 View  Result Date: 05/09/2022 CLINICAL DATA:  Shortness of breath. EXAM: CHEST - 2 VIEW COMPARISON:  Chest radiographs 11/03/2012 FINDINGS: The cardiac silhouette is moderately enlarged which reflects a substantial increased from 2014. There are new symmetric hazy lung opacities bilaterally. No pleural effusion or pneumothorax is identified. A right shoulder arthroplasty is partially visualized. IMPRESSION: Increased cardiomegaly with new hazy lung opacities which could reflect edema, infection, or an inflammatory process. Electronically Signed   By: 2015 M.D.   On: 05/09/2022 16:46     Assessment and Plan:   Acute systolic congestive heart failure-patient presents with symptoms of acute systolic congestive heart failure.  By  preliminary report his LV function is severely reduced (I will review later today).  Will treat with Lasix 40 mg IV twice daily, add spironolactone 12.5 mg daily and Farxiga 10 mg daily.  Follow renal function closely. Cardiomyopathy-etiology unclear.  However blood pressure at time of admission was 158/118 and therefore likely hypertensive mediated.  Will begin Entresto 24/26 twice daily.  Will add carvedilol later as CHF improves and as blood pressure allows.  Patient does not have a significant alcohol  history.  He did have "a gripping sensation in his chest" prior to admission.  Will plan right and left cardiac catheterization as CHF improves.  The risk and benefits including myocardial infarction, CVA and death discussed and he agrees to proceed.  This will likely be Tuesday. Chest pain/elevated troponin-troponin minimally elevated likely secondary to CHF.  However we will plan cardiac catheterization as outlined above. Hypertension-follow blood pressure and adjust medications as needed. Morbid obesity-needs weight loss.  Also would be at risk for sleep apnea.  Will need sleep study following discharge.   Risk Assessment/Risk Scores:        New York Heart Association (NYHA) Functional Class NYHA Class III        For questions or updates, please contact [place] Please consult [email or web address] for contact info under    Signed, Kirk Ruths, MD  05/10/2022 9:37 AM

## 2022-05-10 NOTE — Progress Notes (Signed)
PROGRESS NOTE   Jeffery Benson  TGG:[id number] DOB: December 07, 1975 DOA: 05/09/2022 PCP: Artist Pais, Doe-Hyun R, DO (Inactive)  Brief Narrative:  46 year old black male Prior history of right quadriceps rupture, morbid obesity developed exertional dyspnea over the past month No other real medical illnesses With chest pain which was new  Came to ED x-ray showed symmetric hazy opacities BNP 400 troponin 92-->119 platelets 225 Cardiology gave aspirin 325 started IV heparin and requested medical admission to [facility]  Started on Lasix 40 twice daily  Hospital-Problem based course  New likely HFpEF - Echocardiogram pending - Cardiology input pending and going to [facility] for consideration of cath - Continue Lasix 40 IV twice daily and hold GDMT until we have results of echo  Elevated troponin-trending from 1 19-57 -?  Surrogate for ACS await echo - Continue heparin GTT and defer to cardiology planning  OHS habitus morbid obesity BMI 54 likely sleep apnea - Needs set up in the outpatient setting via PCP for sleep study  DVT prophylaxis: Heparin GTT Code Status: Full Family Communication: Wife at bedside Disposition:  Status is: Inpatient Remains inpatient appropriate because:   Needs workup of new onset heart failure   Consultants:  Cardiology consult pending  Procedures: Echo pending  Antimicrobials: None   Subjective: Tells me may have gained some weight in the past several months because of stress from his job-some lower extremity edema over several weeks No chest pain currently no shortness of breath not on oxygen  Objective: Vitals:   05/10/22 0400 05/10/22 0630 05/10/22 0700 05/10/22 0735  BP: (!) 145/108 (!) 149/105    Pulse: 97 85    Resp: (!) 22 20 20    Temp:    97.9 F (36.6 C)  TempSrc:    Oral  SpO2: (!) 87% 96%    Weight:      Height:       No intake or output data in the 24 hours ending 05/10/22 0800 Filed Weights   05/09/22 1623  Weight: (!) 203.2 kg     Examination:  EOMI NCAT thick neck Mallampati 4 Chest clear no added sound S1-S2 no murmur ROM intact moving all 4 limbs equally Neurologically intact no focal deficit  Data Reviewed: personally reviewed   CBC    Component Value Date/Time   WBC 8.9 05/10/2022 0500   RBC 4.98 05/10/2022 0500   HGB 13.7 05/10/2022 0500   HCT 42.3 05/10/2022 0500   PLT 216 05/10/2022 0500   MCV 84.9 05/10/2022 0500   MCH 27.5 05/10/2022 0500   MCHC 32.4 05/10/2022 0500   RDW 13.8 05/10/2022 0500   LYMPHSABS 2.3 05/09/2022 1736   MONOABS 0.8 05/09/2022 1736   EOSABS 0.5 05/09/2022 1736   BASOSABS 0.1 05/09/2022 1736      Latest Ref Rng & Units 05/10/2022    5:00 AM 05/09/2022    5:36 PM 08/14/2016    9:53 PM  CMP  Glucose 70 - 99 mg/dL 703  500    BUN 6 - 20 mg/dL 15  15    Creatinine 9.38 - 1.24 mg/dL 1.82  9.93  7.16   Sodium 135 - 145 mmol/L 139  139    Potassium 3.5 - 5.1 mmol/L 3.7  4.0    Chloride 98 - 111 mmol/L 107  110    CO2 22 - 32 mmol/L 26  21    Calcium 8.9 - 10.3 mg/dL 8.7  9.2       Radiology Studies: DG Chest  2 View  Result Date: 05/09/2022 CLINICAL DATA:  Shortness of breath. EXAM: CHEST - 2 VIEW COMPARISON:  Chest radiographs 11/03/2012 FINDINGS: The cardiac silhouette is moderately enlarged which reflects a substantial increased from 2014. There are new symmetric hazy lung opacities bilaterally. No pleural effusion or pneumothorax is identified. A right shoulder arthroplasty is partially visualized. IMPRESSION: Increased cardiomegaly with new hazy lung opacities which could reflect edema, infection, or an inflammatory process. Electronically Signed   By: Sebastian Ache M.D.   On: 05/09/2022 16:46     Scheduled Meds:  aspirin  81 mg Oral Daily   furosemide  40 mg Intravenous Q12H   potassium chloride  10 mEq Oral Daily   sodium chloride flush  3 mL Intravenous Q12H   Continuous Infusions:  heparin 2,600 Units/hr (05/10/22 0541)     LOS: 1 day   Time  spent: 72  Rhetta Mura, MD [facility] To contact the attending provider between 7A-7P or the covering provider during after hours 7P-7A, please log into the web site [email or web address] and access using universal [employer] password for that web site. If you do not have the password, please call the hospital operator.  05/10/2022, 8:00 AM

## 2022-05-11 DIAGNOSIS — I4729 Other ventricular tachycardia: Secondary | ICD-10-CM | POA: Insufficient documentation

## 2022-05-11 DIAGNOSIS — I5021 Acute systolic (congestive) heart failure: Secondary | ICD-10-CM | POA: Diagnosis not present

## 2022-05-11 HISTORY — DX: Other ventricular tachycardia: I47.29

## 2022-05-11 LAB — BASIC METABOLIC PANEL
Anion gap: 13 (ref 5–15)
BUN: 13 mg/dL (ref 6–20)
CO2: 24 mmol/L (ref 22–32)
Calcium: 9.4 mg/dL (ref 8.9–10.3)
Chloride: 101 mmol/L (ref 98–111)
Creatinine, Ser: 1.25 mg/dL — ABNORMAL HIGH (ref 0.61–1.24)
GFR, Estimated: >60 mL/min (ref >60)
Glucose, Bld: 118 mg/dL — ABNORMAL HIGH (ref 70–99)
Potassium: 3.6 mmol/L (ref 3.5–5.1)
Sodium: 138 mmol/L (ref 135–145)

## 2022-05-11 LAB — CBC
HCT: 46 % (ref 39.0–52.0)
Hemoglobin: 16 g/dL (ref 13.0–17.0)
MCH: 28.6 pg (ref 26.0–34.0)
MCHC: 34.8 g/dL (ref 30.0–36.0)
MCV: 82.1 fL (ref 80.0–100.0)
Platelets: 231 10*3/uL (ref 150–400)
RBC: 5.6 MIL/uL (ref 4.22–5.81)
RDW: 13.5 % (ref 11.5–15.5)
WBC: 7.9 10*3/uL (ref 4.0–10.5)
nRBC: 0 % (ref 0.0–0.2)

## 2022-05-11 LAB — HEPARIN LEVEL (UNFRACTIONATED): Heparin Unfractionated: 0.76 IU/mL — ABNORMAL HIGH (ref 0.30–0.70)

## 2022-05-11 MED ORDER — CARVEDILOL 3.125 MG PO TABS
3.1250 mg | ORAL_TABLET | Freq: Two times a day (BID) | ORAL | Status: DC
  Administered 2022-05-11 – 2022-05-13 (×4): 3.125 mg via ORAL
  Filled 2022-05-11 (×4): qty 1

## 2022-05-11 MED ORDER — FUROSEMIDE 10 MG/ML IJ SOLN
40.0000 mg | Freq: Two times a day (BID) | INTRAMUSCULAR | Status: DC
  Filled 2022-05-11: qty 4

## 2022-05-11 MED ORDER — POTASSIUM CHLORIDE CRYS ER 20 MEQ PO TBCR
40.0000 meq | EXTENDED_RELEASE_TABLET | Freq: Two times a day (BID) | ORAL | Status: DC
  Administered 2022-05-11 – 2022-05-12 (×3): 40 meq via ORAL
  Filled 2022-05-11 (×3): qty 2

## 2022-05-11 MED ORDER — HEPARIN SODIUM (PORCINE) 5000 UNIT/ML IJ SOLN
5000.0000 [IU] | Freq: Three times a day (TID) | INTRAMUSCULAR | Status: DC
  Administered 2022-05-11 – 2022-05-13 (×7): 5000 [IU] via SUBCUTANEOUS
  Filled 2022-05-11 (×7): qty 1

## 2022-05-11 MED ORDER — FUROSEMIDE 10 MG/ML IJ SOLN: 40.0000 mg | Freq: Every day | INTRAMUSCULAR | Status: DC

## 2022-05-11 MED ORDER — POTASSIUM CHLORIDE CRYS ER 20 MEQ PO TBCR
20.0000 meq | EXTENDED_RELEASE_TABLET | Freq: Once | ORAL | Status: AC
  Administered 2022-05-11: 20 meq via ORAL
  Filled 2022-05-11: qty 1

## 2022-05-11 MED ORDER — FUROSEMIDE 10 MG/ML IJ SOLN
40.0000 mg | Freq: Every day | INTRAMUSCULAR | Status: DC
  Administered 2022-05-11: 40 mg via INTRAVENOUS
  Filled 2022-05-11: qty 4

## 2022-05-11 NOTE — Progress Notes (Signed)
CCMD called and stated that pt had a 6 beat run of vtach at 1800. Pt was asymptomatic. MD notified and no new orders placed. Will continue to monitor.

## 2022-05-11 NOTE — Progress Notes (Addendum)
ANTICOAGULATION CONSULT NOTE  Pharmacy Consult for heparin Indication: chest pain/ACS  No Known Allergies  Patient Measurements: Height: 6\' 4"  (193 cm) Weight: (!) 191.7 kg (422 lb 9.6 oz) IBW/kg (Calculated) : 86.8 Heparin Dosing Weight: 137 kg  Vital Signs: Temp: 98.6 F (37 C) (12/31 0514) Temp Source: Oral (12/31 0514) BP: 106/67 (12/31 0514) Pulse Rate: 97 (12/31 0514)  Labs: Recent Labs    05/09/22 1736 05/09/22 1736 05/09/22 2008 05/09/22 2121 05/10/22 0100 05/10/22 0500 05/10/22 1200 05/10/22 1930 05/11/22 0129 05/11/22 0710  HGB 14.3  --   --   --   --  13.7  --   --   --  16.0  HCT 43.5  --   --   --   --  42.3  --   --   --  46.0  PLT 225  --   --   --   --  216  --   --   --  231  HEPARINUNFRC  --    < >  --   --   --  <0.10* 0.26* 0.21*  --  0.76*  CREATININE 1.06  --   --   --   --  1.09  --   --  1.25*  --   TROPONINIHS 92*  --  119* 109* 57*  --   --   --   --   --    < > = values in this interval not displayed.     Estimated Creatinine Clearance: 131.7 mL/min (A) (by C-G formula based on SCr of 1.25 mg/dL (H)).   Medications:  Infusions:   heparin 3,100 Units/hr (05/11/22 0106)    Assessment: 46 yom who presented to the ED with SOB. Pharmacy consulted to manage IV heparin for ACS. Plan is for [facility] as HF improves.  Heparin level is supratherapeutic at 0.76 on 3100 units/hr. CBC is normal. No problems with infusion or bleeding noted.   Goal of Therapy:  Heparin level 0.3-0.7 units/ml Monitor platelets by anticoagulation protocol: Yes   Plan:  Decrease infusion to 3000 units/hr 6 hr heparin level Daily heparin level and CBC Monitor for signs/symptoms of bleeding  Addendum:  Stop heparin per MD  L/RHC planned for Tuesday   Thank you for involving pharmacy in this patient's care.  Sunday, PharmD Pharmacy Resident  05/11/2022 8:21 AM

## 2022-05-11 NOTE — Assessment & Plan Note (Signed)
Asymptomatic - Keep mag>2, K>4

## 2022-05-11 NOTE — Progress Notes (Signed)
  Progress Note   Patient: Jeffery Benson OYD:[id number] DOB: 1975/05/30 DOA: 05/09/2022     2 DOS: the patient was seen and examined on 05/11/2022 at 10:00AM      Brief hospital course: Jeffery Benson is a 46 y.o. M with MO BMI 51 who presented with chest discomfort.     12/29: Admitted  12/30: Echo showed EF 20-25%, Cardiology consulted 12/31: Transferred to [facility] for LHC     Assessment and Plan: * Acute systolic CHF (congestive heart failure) (HCC) No prior CHF.  Here, EF reduced.  - Continue Lasix per Cardiology  - Continue BB, Entresto, per Cardiology   - [facility] pending    Myocardial injury Due to CHF  NSVT (nonsustained ventricular tachycardia) (HCC) Asymptomatic - Keep mag>2, K>4  At risk for sleep apnea - Outpatient PSG  Obesity Morbid obesity BMI 51          Subjective: Feeling better.     Physical Exam: BP 108/77   Pulse 90   Temp 98.3 F (36.8 C) (Oral)   Resp 18   Ht 6\' 4"  (1.93 m)   Wt (!) 191.7 kg   SpO2 93%   BMI 51.44 kg/m   Obese adult male, lying in bed, interactive and appropriate Minimal edema, cardiopulmonary exam lmited by habitus but nonfocal Attention and gross motor normal  Data Reviewed: CBC and BMP essentially normal, Cr up to 1.2  Family Communication: Father at bedside    Disposition: Status is: Inpatient         Author: , MD 05/11/2022 7:09 PM  For on call review [email or web address].

## 2022-05-11 NOTE — Assessment & Plan Note (Signed)
Morbid obesity-BMI 51 

## 2022-05-11 NOTE — Progress Notes (Signed)
Rounding Note    Patient Name: Jeffery Benson Date of Encounter: 05/11/2022  [place] Cardiologist: New  Subjective   No CP; dyspnea improved  Inpatient Medications    Scheduled Meds:  aspirin  81 mg Oral Daily   dapagliflozin propanediol  10 mg Oral Daily   furosemide  40 mg Intravenous Q12H   sacubitril-valsartan  1 tablet Oral BID   sodium chloride flush  3 mL Intravenous Q12H   spironolactone  12.5 mg Oral Daily   Continuous Infusions:  heparin 3,100 Units/hr (05/11/22 0106)   PRN Meds: acetaminophen **OR** acetaminophen, hydrALAZINE, ondansetron **OR** ondansetron (ZOFRAN) IV, senna-docusate   Vital Signs    Vitals:   05/10/22 1807 05/10/22 2143 05/11/22 0044 05/11/22 0514  BP: (!) 115/96 129/89 122/81 106/67  Pulse: (!) 102 (!) 52 72 97  Resp: 20 20 18 20   Temp: 98.2 F (36.8 C) 98.1 F (36.7 C) 98.5 F (36.9 C) 98.6 F (37 C)  TempSrc: Oral Oral Oral Oral  SpO2: 92% 94% 98% 90%  Weight:    (!) 191.7 kg  Height:        Intake/Output Summary (Last 24 hours) at 05/11/2022 0829 Last data filed at 05/11/2022 0511 Gross per 24 hour  Intake 1944.85 ml  Output 4425 ml  Net -2480.15 ml      05/11/2022    5:14 AM 05/09/2022    4:23 PM 08/14/2016    2:24 PM  Last 3 Weights  Weight (lbs) 422 lb 9.6 oz 448 lb 406 lb 9 oz  Weight (kg) 191.69 kg 203.211 kg 184.416 kg      Telemetry    Sinus with 8 beats NSVT- Personally Reviewed   Physical Exam   GEN: No acute distress.  Obese Neck: No JVD Cardiac: RRR, no murmurs, rubs, or gallops.  Respiratory: Clear to auscultation bilaterally. GI: Soft, nontender, non-distended  MS: No edema Neuro:  Nonfocal  Psych: Normal affect   Labs    High Sensitivity Troponin:   Recent Labs  Lab 05/09/22 1736 05/09/22 2008 05/09/22 2121 05/10/22 0100  TROPONINIHS 92* 119* 109* 57*     Chemistry Recent Labs  Lab 05/09/22 1736 05/10/22 0500 05/11/22 0129  NA 139 139 138  K 4.0 3.7 3.6   CL 110 107 101  CO2 21* 26 24  GLUCOSE 112* 116* 118*  BUN 15 15 13   CREATININE 1.06 1.09 1.25*  CALCIUM 9.2 8.7* 9.4  MG  --  2.0  --   GFRNONAA >60 >60 >60  ANIONGAP 8 6 13      Hematology Recent Labs  Lab 05/09/22 1736 05/10/22 0500 05/11/22 0710  WBC 9.4 8.9 7.9  RBC 5.09 4.98 5.60  HGB 14.3 13.7 16.0  HCT 43.5 42.3 46.0  MCV 85.5 84.9 82.1  MCH 28.1 27.5 28.6  MCHC 32.9 32.4 34.8  RDW 13.8 13.8 13.5  PLT 225 216 231    BNP Recent Labs  Lab 05/09/22 1736  BNP 406.6*     Radiology    ECHOCARDIOGRAM COMPLETE  Result Date: 05/10/2022    ECHOCARDIOGRAM REPORT   Patient Name:   KOHNER KOWALSKY Date of Exam: 05/10/2022 Medical Rec #:  [id number]      Height:       76.0 in Accession #:    [id number]     Weight:       448.0 lb Date of Birth:  [birth year]       BSA:  3.121 m Patient Age:    46 years       BP:           145/99 mmHg Patient Gender: M              HR:           92 bpm. Exam Location:  Inpatient Procedure: 2D Echo, Cardiac Doppler, Color Doppler and Intracardiac            Opacification Agent Indications:    Congestive Heart Failure I50.9  History:        Patient has no prior history of Echocardiogram examinations.                 CHF.  Sonographer:    Eulah Pont RDCS Referring Phys: [id number] Floreen Comber PATEL  Sonographer Comments: Technically difficult study due to poor echo windows and patient is obese. IMPRESSIONS  1. Left ventricular ejection fraction, by estimation, is 20 to 25%. The left ventricle has severely decreased function. The left ventricle demonstrates global hypokinesis. The left ventricular internal cavity size was mildly dilated. Left ventricular diastolic parameters are consistent with Grade II diastolic dysfunction (pseudonormalization).  2. Right ventricular systolic function is normal. The right ventricular size is normal.  3. Left atrial size was severely dilated.  4. The mitral valve is normal in structure. Mild mitral valve regurgitation.  No evidence of mitral stenosis.  5. The aortic valve is tricuspid. Aortic valve regurgitation is not visualized. No aortic stenosis is present.  6. The inferior vena cava is dilated in size with <50% respiratory variability, suggesting right atrial pressure of 15 mmHg. FINDINGS  Left Ventricle: Left ventricular ejection fraction, by estimation, is 20 to 25%. The left ventricle has severely decreased function. The left ventricle demonstrates global hypokinesis. Definity contrast agent was given IV to delineate the left ventricular endocardial borders. The left ventricular internal cavity size was mildly dilated. There is no left ventricular hypertrophy. Left ventricular diastolic parameters are consistent with Grade II diastolic dysfunction (pseudonormalization). Right Ventricle: The right ventricular size is normal. No increase in right ventricular wall thickness. Right ventricular systolic function is normal. Left Atrium: Left atrial size was severely dilated. Right Atrium: Right atrial size was normal in size. Pericardium: There is no evidence of pericardial effusion. Mitral Valve: The mitral valve is normal in structure. Mild mitral valve regurgitation, with posteriorly-directed jet. No evidence of mitral valve stenosis. Tricuspid Valve: The tricuspid valve is normal in structure. Tricuspid valve regurgitation is not demonstrated. No evidence of tricuspid stenosis. Aortic Valve: The aortic valve is tricuspid. Aortic valve regurgitation is not visualized. No aortic stenosis is present. Pulmonic Valve: The pulmonic valve was normal in structure. Pulmonic valve regurgitation is not visualized. No evidence of pulmonic stenosis. Aorta: The aortic root is normal in size and structure. Venous: The inferior vena cava is dilated in size with less than 50% respiratory variability, suggesting right atrial pressure of 15 mmHg. IAS/Shunts: No atrial level shunt detected by color flow Doppler.  LEFT VENTRICLE PLAX 2D LVIDd:          6.90 cm   Diastology LVIDs:         6.20 cm   LV e' medial:    3.78 cm/s LV PW:         1.10 cm   LV E/e' medial:  21.7 LV IVS:        1.00 cm   LV e' lateral:   5.98 cm/s LVOT diam:  2.40 cm   LV E/e' lateral: 13.7 LV SV:         48 LV SV Index:   15 LVOT Area:     4.52 cm  RIGHT VENTRICLE RV S prime:     7.35 cm/s TAPSE (M-mode): 1.6 cm LEFT ATRIUM              Index        RIGHT ATRIUM           Index LA diam:        5.50 cm  1.76 cm/m   RA Area:     30.50 cm LA Vol (A2C):   177.0 ml 56.72 ml/m  RA Volume:   96.60 ml  30.95 ml/m LA Vol (A4C):   174.0 ml 55.75 ml/m LA Biplane Vol: 176.0 ml 56.39 ml/m  AORTIC VALVE LVOT Vmax:   77.13 cm/s LVOT Vmean:  48.600 cm/s LVOT VTI:    0.107 m  AORTA Ao Root diam: 3.40 cm Ao Asc diam:  3.80 cm MITRAL VALVE MV Area (PHT): 6.37 cm    SHUNTS MV Decel Time: 119 msec    Systemic VTI:  0.11 m MV E velocity: 82.00 cm/s  Systemic Diam: 2.40 cm MV A velocity: 53.80 cm/s MV E/A ratio:  1.52 Candee Furbish MD Electronically signed by Candee Furbish MD Signature Date/Time: 05/10/2022/12:07:30 PM    Final    DG Chest 2 View  Result Date: 05/09/2022 CLINICAL DATA:  Shortness of breath. EXAM: CHEST - 2 VIEW COMPARISON:  Chest radiographs 11/03/2012 FINDINGS: The cardiac silhouette is moderately enlarged which reflects a substantial increased from 2014. There are new symmetric hazy lung opacities bilaterally. No pleural effusion or pneumothorax is identified. A right shoulder arthroplasty is partially visualized. IMPRESSION: Increased cardiomegaly with new hazy lung opacities which could reflect edema, infection, or an inflammatory process. Electronically Signed   By: Logan Bores M.D.   On: 05/09/2022 16:46     Patient Profile     46 y.o. male with past medical history of hypertension admitted with acute systolic congestive heart failure.  Echocardiogram shows ejection fraction 20 to 25%, mild left ventricular enlargement, grade 2 diastolic dysfunction, severe left  atrial enlargement, mild mitral regurgitation.  Assessment & Plan    1 acute systolic congestive heart failure-I/O-2480.  Symptomatically patient is improved.  Will decrease Lasix to 40 mg IV daily and continue low-dose spironolactone.  Continue Farxiga.  Follow renal function.  2 cardiomyopathy-etiology unclear but felt possibly secondary to uncontrolled hypertension.  Continue Entresto.  Begin carvedilol 3.125 mg twice daily.  Titrate medications as tolerated by pulse or blood pressure.  3 chest pain/elevated troponin-minimal elevation in troponin at time of admission likely secondary to CHF.  However he did describe a "gripping sensation" in his chest transiently and given reduced LV function plan is for right and left cardiac catheterization on Tuesday.  The risk and benefits previously discussed including myocardial infarction, CVA and death and he agrees to proceed.  4 hypertension-blood pressure has improved.  Will follow and adjust medications as needed.  5 morbid obesity-plan outpatient sleep study to rule out sleep apnea.  Needs weight loss.  For questions or updates, please contact [place] Please consult [email or web address] for contact info under        Signed, Kirk Ruths, MD  05/11/2022, 8:29 AM

## 2022-05-11 NOTE — Plan of Care (Signed)

## 2022-05-11 NOTE — Progress Notes (Signed)
After bathing at the sink, pt c/o chest discomfort, SOB, and dizziness.  Pt assisted back to bed.  Sats 86% on RA, increased to 92% with 4L/Kiron.  BP & HR stable.  Symptoms resolved with rest and O2.  EKG obtained.  Dr. Margo Aye paged regarding the above.  She came to bedside to assess pt.  Will continue to monitor.  Alonza Bogus

## 2022-05-12 DIAGNOSIS — R079 Chest pain, unspecified: Secondary | ICD-10-CM | POA: Diagnosis not present

## 2022-05-12 DIAGNOSIS — I10 Essential (primary) hypertension: Secondary | ICD-10-CM

## 2022-05-12 DIAGNOSIS — I5021 Acute systolic (congestive) heart failure: Secondary | ICD-10-CM | POA: Diagnosis not present

## 2022-05-12 LAB — BASIC METABOLIC PANEL
Anion gap: 10 (ref 5–15)
BUN: 18 mg/dL (ref 6–20)
CO2: 25 mmol/L (ref 22–32)
Calcium: 9.4 mg/dL (ref 8.9–10.3)
Chloride: 100 mmol/L (ref 98–111)
Creatinine, Ser: 1.31 mg/dL — ABNORMAL HIGH (ref 0.61–1.24)
GFR, Estimated: >60 mL/min (ref >60)
Glucose, Bld: 107 mg/dL — ABNORMAL HIGH (ref 70–99)
Potassium: 4 mmol/L (ref 3.5–5.1)
Sodium: 135 mmol/L (ref 135–145)

## 2022-05-12 LAB — CBC
HCT: 48.5 % (ref 39.0–52.0)
Hemoglobin: 16.2 g/dL (ref 13.0–17.0)
MCH: 28.2 pg (ref 26.0–34.0)
MCHC: 33.4 g/dL (ref 30.0–36.0)
MCV: 84.3 fL (ref 80.0–100.0)
Platelets: 248 10*3/uL (ref 150–400)
RBC: 5.75 MIL/uL (ref 4.22–5.81)
RDW: 13.5 % (ref 11.5–15.5)
WBC: 7.4 10*3/uL (ref 4.0–10.5)
nRBC: 0 % (ref 0.0–0.2)

## 2022-05-12 LAB — MAGNESIUM: Magnesium: 2.2 mg/dL (ref 1.7–2.4)

## 2022-05-12 MED ORDER — SODIUM CHLORIDE 0.9% FLUSH: 3.0000 mL | INTRAVENOUS | Status: DC | PRN

## 2022-05-12 MED ORDER — SODIUM CHLORIDE 0.9 % IV SOLN: 250.0000 mL | INTRAVENOUS | Status: DC | PRN

## 2022-05-12 MED ORDER — ASPIRIN 81 MG PO CHEW
81.0000 mg | CHEWABLE_TABLET | ORAL | Status: AC
  Administered 2022-05-13: 81 mg via ORAL
  Filled 2022-05-12: qty 1

## 2022-05-12 MED ORDER — SODIUM CHLORIDE 0.9 % IV SOLN: INTRAVENOUS | Status: DC

## 2022-05-12 MED ORDER — ASPIRIN 81 MG PO CHEW: 81.0000 mg | CHEWABLE_TABLET | Freq: Every day | ORAL | Status: DC

## 2022-05-12 MED ORDER — SODIUM CHLORIDE 0.9% FLUSH: 3.0000 mL | Freq: Two times a day (BID) | INTRAVENOUS | Status: DC

## 2022-05-12 MED ORDER — FUROSEMIDE 40 MG PO TABS
40.0000 mg | ORAL_TABLET | Freq: Every day | ORAL | Status: DC
  Administered 2022-05-12 – 2022-05-13 (×2): 40 mg via ORAL
  Filled 2022-05-12 (×2): qty 1

## 2022-05-12 NOTE — Progress Notes (Signed)
  Progress Note   Patient: Jeffery Benson EQA:[id number] DOB: 24-Feb-1976 DOA: 05/09/2022     3 DOS: the patient was seen and examined on 05/12/2022 at 10:00AM      Brief hospital course: Mr. Prout is a 47 y.o. M with MO BMI 51 who presented with chest discomfort.     12/29: Admitted  12/30: Echo showed EF 20-25%, Cardiology consulted 12/31: Transferred to [facility] for [place]     Assessment and Plan: * Acute systolic CHF (congestive heart failure) ([place]) No prior CHF.  Here, EF reduced.  - Continue Lasix per Cardiology  - Continue BB, Entresto, per Cardiology   - [facility] pending    Myocardial injury Due to CHF  NSVT (nonsustained ventricular tachycardia) (HCC) Asymptomatic - Keep mag>2, K>4  At risk for sleep apnea - Outpatient PSG  Obesity Morbid obesity BMI 51          Subjective: Feels okay, able to walk in hall.     Physical Exam: BP (!) 125/111   Pulse 92   Temp 98.7 F (37.1 C) (Oral)   Resp 16   Ht 6\' 4"  (1.93 m)   Wt (!) 193 kg   SpO2 91%   BMI 51.79 kg/m   Obese adult male, lying in bed, interactive and appropriate Minimal edema, cardiopulmonary exam lmited by habitus but nonfocal Attention and gross motor normal  Data Reviewed: CBC and BMP essentially normal, Cr up to 1.3  Family Communication: Wife at bedside    Disposition: Status is: Inpatient         Author: Edwin Dada, MD 05/12/2022 6:19 PM  For on call review [email or web address].

## 2022-05-12 NOTE — Progress Notes (Signed)
Progress Note  Patient Name: Jeffery Benson Date of Encounter: 05/12/2022  Primary Cardiologist: None  Subjective   Denied chest pain and SOB. Improved symptoms of leg swelling.   Inpatient Medications    Scheduled Meds:  aspirin  81 mg Oral Daily   carvedilol  3.125 mg Oral BID WC   dapagliflozin propanediol  10 mg Oral Daily   furosemide  40 mg Intravenous Daily   heparin injection (subcutaneous)  5,000 Units Subcutaneous Q8H   potassium chloride  40 mEq Oral BID   sacubitril-valsartan  1 tablet Oral BID   sodium chloride flush  3 mL Intravenous Q12H   spironolactone  12.5 mg Oral Daily   Continuous Infusions:  PRN Meds: acetaminophen **OR** acetaminophen, ondansetron **OR** ondansetron (ZOFRAN) IV, senna-docusate   Vital Signs    Vitals:   05/11/22 1729 05/11/22 2100 05/12/22 0029 05/12/22 0324  BP: 108/77 (!) 128/99 (!) 126/92 (!) 130/95  Pulse: 90 91 88 93  Resp:  18 20 19   Temp:  98.4 F (36.9 C) 97.9 F (36.6 C) 99.2 F (37.3 C)  TempSrc:  Oral Oral Oral  SpO2:  95% 96% 95%  Weight:    (!) 193 kg  Height:        Intake/Output Summary (Last 24 hours) at 05/12/2022 0805 Last data filed at 05/12/2022 0326 Gross per 24 hour  Intake 360 ml  Output 2800 ml  Net -2440 ml   Filed Weights   05/09/22 1623 05/11/22 0514 05/12/22 0324  Weight: (!) 203.2 kg (!) 191.7 kg (!) 193 kg    Telemetry     Personally reviewed, 4 beat of NSVT last night  ECG   NSR  Physical Exam   GEN: No acute distress.   Neck: Unable to assess JVD due to body habitus. Cardiac: RRR, no murmur, rub, or gallop.  Respiratory: Nonlabored. Clear to auscultation bilaterally. GI: Soft, nontender, bowel sounds present. MS: No edema; No deformity. Neuro:  Nonfocal. Psych: Alert and oriented x 3. Normal affect.  Labs    Chemistry Recent Labs  Lab 05/10/22 0500 05/11/22 0129 05/12/22 0148  NA 139 138 135  K 3.7 3.6 4.0  CL 107 101 100  CO2 26 24 25   GLUCOSE 116* 118*  107*  BUN 15 13 18   CREATININE 1.09 1.25* 1.31*  CALCIUM 8.7* 9.4 9.4  GFRNONAA >60 >60 >60  ANIONGAP 6 13 10      Hematology Recent Labs  Lab 05/10/22 0500 05/11/22 0710 05/12/22 0148  WBC 8.9 7.9 7.4  RBC 4.98 5.60 5.75  HGB 13.7 16.0 16.2  HCT 42.3 46.0 48.5  MCV 84.9 82.1 84.3  MCH 27.5 28.6 28.2  MCHC 32.4 34.8 33.4  RDW 13.8 13.5 13.5  PLT 216 231 248    Cardiac Enzymes Recent Labs  Lab 05/09/22 1736 05/09/22 2008 05/09/22 2121 05/10/22 0100  TROPONINIHS 92* 119* 109* 57*    BNP Recent Labs  Lab 05/09/22 1736  BNP 406.6*     DDimerNo results for input(s): "DDIMER" in the last 168 hours.   Cardiac studies    ECHOCARDIOGRAM COMPLETE  Result Date: 05/10/2022    ECHOCARDIOGRAM REPORT   Patient Name:   DENSON NICCOLI Date of Exam: 05/10/2022 Medical Rec #:  05/11/22      Height:       76.0 in Accession #:    [id number]     Weight:       448.0 lb Date of Birth:  November 20, 1975  BSA:          3.121 m Patient Age:    22 years       BP:           145/99 mmHg Patient Gender: M              HR:           92 bpm. Exam Location:  Inpatient Procedure: 2D Echo, Cardiac Doppler, Color Doppler and Intracardiac            Opacification Agent Indications:    Congestive Heart Failure I50.9  History:        Patient has no prior history of Echocardiogram examinations.                 CHF.  Sonographer:    Bernadene Person RDCS Referring Phys: [id number] Cleaster Corin PATEL  Sonographer Comments: Technically difficult study due to poor echo windows and patient is obese. IMPRESSIONS  1. Left ventricular ejection fraction, by estimation, is 20 to 25%. The left ventricle has severely decreased function. The left ventricle demonstrates global hypokinesis. The left ventricular internal cavity size was mildly dilated. Left ventricular diastolic parameters are consistent with Grade II diastolic dysfunction (pseudonormalization).  2. Right ventricular systolic function is normal. The right ventricular  size is normal.  3. Left atrial size was severely dilated.  4. The mitral valve is normal in structure. Mild mitral valve regurgitation. No evidence of mitral stenosis.  5. The aortic valve is tricuspid. Aortic valve regurgitation is not visualized. No aortic stenosis is present.  6. The inferior vena cava is dilated in size with <50% respiratory variability, suggesting right atrial pressure of 15 mmHg. FINDINGS  Left Ventricle: Left ventricular ejection fraction, by estimation, is 20 to 25%. The left ventricle has severely decreased function. The left ventricle demonstrates global hypokinesis. Definity contrast agent was given IV to delineate the left ventricular endocardial borders. The left ventricular internal cavity size was mildly dilated. There is no left ventricular hypertrophy. Left ventricular diastolic parameters are consistent with Grade II diastolic dysfunction (pseudonormalization). Right Ventricle: The right ventricular size is normal. No increase in right ventricular wall thickness. Right ventricular systolic function is normal. Left Atrium: Left atrial size was severely dilated. Right Atrium: Right atrial size was normal in size. Pericardium: There is no evidence of pericardial effusion. Mitral Valve: The mitral valve is normal in structure. Mild mitral valve regurgitation, with posteriorly-directed jet. No evidence of mitral valve stenosis. Tricuspid Valve: The tricuspid valve is normal in structure. Tricuspid valve regurgitation is not demonstrated. No evidence of tricuspid stenosis. Aortic Valve: The aortic valve is tricuspid. Aortic valve regurgitation is not visualized. No aortic stenosis is present. Pulmonic Valve: The pulmonic valve was normal in structure. Pulmonic valve regurgitation is not visualized. No evidence of pulmonic stenosis. Aorta: The aortic root is normal in size and structure. Venous: The inferior vena cava is dilated in size with less than 50% respiratory variability,  suggesting right atrial pressure of 15 mmHg. IAS/Shunts: No atrial level shunt detected by color flow Doppler.  LEFT VENTRICLE PLAX 2D LVIDd:         6.90 cm   Diastology LVIDs:         6.20 cm   LV e' medial:    3.78 cm/s LV PW:         1.10 cm   LV E/e' medial:  21.7 LV IVS:        1.00 cm   LV  e' lateral:   5.98 cm/s LVOT diam:     2.40 cm   LV E/e' lateral: 13.7 LV SV:         48 LV SV Index:   15 LVOT Area:     4.52 cm  RIGHT VENTRICLE RV S prime:     7.35 cm/s TAPSE (M-mode): 1.6 cm LEFT ATRIUM              Index        RIGHT ATRIUM           Index LA diam:        5.50 cm  1.76 cm/m   RA Area:     30.50 cm LA Vol (A2C):   177.0 ml 56.72 ml/m  RA Volume:   96.60 ml  30.95 ml/m LA Vol (A4C):   174.0 ml 55.75 ml/m LA Biplane Vol: 176.0 ml 56.39 ml/m  AORTIC VALVE LVOT Vmax:   77.13 cm/s LVOT Vmean:  48.600 cm/s LVOT VTI:    0.107 m  AORTA Ao Root diam: 3.40 cm Ao Asc diam:  3.80 cm MITRAL VALVE MV Area (PHT): 6.37 cm    SHUNTS MV Decel Time: 119 msec    Systemic VTI:  0.11 m MV E velocity: 82.00 cm/s  Systemic Diam: 2.40 cm MV A velocity: 53.80 cm/s MV E/A ratio:  1.52 Candee Furbish MD Electronically signed by Candee Furbish MD Signature Date/Time: 05/10/2022/12:07:30 PM    Final     Assessment & Plan    Patient is a 47 year old M known to have HTN, morbid obesity, 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  He was eventually transferred to [facility] for management of ADHF and [facility].  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 Lasix 40 mg once daily -Increase carvedilol from 3.125 mg to 6.25 mg twice daily (due to 4 beat run of nonsustained V. tach last night) -Continue Entresto 24-26 mg twice daily -Continue spironolactone 12.5 mg  daily -Continue Farxiga 10 mg once daily -Patient scheduled for right heart cath and left heart cath on 05/13/2022. Risks and benefits of cardiac catheterization have been discussed with the patient. These include bleeding, infection, kidney damage, stroke, heart attack, death. The patient understands these risks and is willing to proceed.  # Chest pain/minimally elevated troponin at the time of admission likely secondary to CHF. He scheduled for RHC and LHC on 05/13/2022.  # HTN, controlled -Switch IV Lasix to p.o. Lasix 40 mg once daily -Increase carvedilol from 3.125 mg to 6.25 mg twice daily -Continue Entresto 24-26 mg twice daily -Continue spironolactone 12.5 mg once daily  # Morbid obesity -Plan for outpatient sleep study per prior note and weight loss counseling.  I have spent a total of 33 minutes with patient reviewing chart , telemetry, EKGs, labs and examining patient as well as establishing an assessment and plan that was discussed with the patient.  > 50% of time was spent in direct patient care.     Signed, Chalmers Guest, MD  05/12/2022, 8:05 AM

## 2022-05-12 NOTE — H&P (View-Only) (Signed)
Progress Note  Patient Name: Jeffery Benson Date of Encounter: 05/12/2022  Primary Cardiologist: None  Subjective   Denied chest pain and SOB. Improved symptoms of leg swelling.   Inpatient Medications    Scheduled Meds:  aspirin  81 mg Oral Daily   carvedilol  3.125 mg Oral BID WC   dapagliflozin propanediol  10 mg Oral Daily   furosemide  40 mg Intravenous Daily   heparin injection (subcutaneous)  5,000 Units Subcutaneous Q8H   potassium chloride  40 mEq Oral BID   sacubitril-valsartan  1 tablet Oral BID   sodium chloride flush  3 mL Intravenous Q12H   spironolactone  12.5 mg Oral Daily   Continuous Infusions:  PRN Meds: acetaminophen **OR** acetaminophen, ondansetron **OR** ondansetron (ZOFRAN) IV, senna-docusate   Vital Signs    Vitals:   05/11/22 1729 05/11/22 2100 05/12/22 0029 05/12/22 0324  BP: 108/77 (!) 128/99 (!) 126/92 (!) 130/95  Pulse: 90 91 88 93  Resp:  18 20 19   Temp:  98.4 F (36.9 C) 97.9 F (36.6 C) 99.2 F (37.3 C)  TempSrc:  Oral Oral Oral  SpO2:  95% 96% 95%  Weight:    (!) 193 kg  Height:        Intake/Output Summary (Last 24 hours) at 05/12/2022 0805 Last data filed at 05/12/2022 0326 Gross per 24 hour  Intake 360 ml  Output 2800 ml  Net -2440 ml   Filed Weights   05/09/22 1623 05/11/22 0514 05/12/22 0324  Weight: (!) 203.2 kg (!) 191.7 kg (!) 193 kg    Telemetry     Personally reviewed, 4 beat of NSVT last night  ECG   NSR  Physical Exam   GEN: No acute distress.   Neck: Unable to assess JVD due to body habitus. Cardiac: RRR, no murmur, rub, or gallop.  Respiratory: Nonlabored. Clear to auscultation bilaterally. GI: Soft, nontender, bowel sounds present. MS: No edema; No deformity. Neuro:  Nonfocal. Psych: Alert and oriented x 3. Normal affect.  Labs    Chemistry Recent Labs  Lab 05/10/22 0500 05/11/22 0129 05/12/22 0148  NA 139 138 135  K 3.7 3.6 4.0  CL 107 101 100  CO2 26 24 25   GLUCOSE 116* 118*  107*  BUN 15 13 18   CREATININE 1.09 1.25* 1.31*  CALCIUM 8.7* 9.4 9.4  GFRNONAA >60 >60 >60  ANIONGAP 6 13 10      Hematology Recent Labs  Lab 05/10/22 0500 05/11/22 0710 05/12/22 0148  WBC 8.9 7.9 7.4  RBC 4.98 5.60 5.75  HGB 13.7 16.0 16.2  HCT 42.3 46.0 48.5  MCV 84.9 82.1 84.3  MCH 27.5 28.6 28.2  MCHC 32.4 34.8 33.4  RDW 13.8 13.5 13.5  PLT 216 231 248    Cardiac Enzymes Recent Labs  Lab 05/09/22 1736 05/09/22 2008 05/09/22 2121 05/10/22 0100  TROPONINIHS 92* 119* 109* 57*    BNP Recent Labs  Lab 05/09/22 1736  BNP 406.6*     DDimerNo results for input(s): "DDIMER" in the last 168 hours.   Cardiac studies    ECHOCARDIOGRAM COMPLETE  Result Date: 05/10/2022    ECHOCARDIOGRAM REPORT   Patient Name:   Jeffery Benson Date of Exam: 05/10/2022 Medical Rec #:  05/11/22      Height:       76.0 in Accession #:    05/12/2022     Weight:       448.0 lb Date of Birth:  Aug 05, 1975  BSA:          3.121 m Patient Age:    47 years       BP:           145/99 mmHg Patient Gender: M              HR:           92 bpm. Exam Location:  Inpatient Procedure: 2D Echo, Cardiac Doppler, Color Doppler and Intracardiac            Opacification Agent Indications:    Congestive Heart Failure I50.9  History:        Patient has no prior history of Echocardiogram examinations.                 CHF.  Sonographer:    Bernadene Person RDCS Referring Phys: [id number] Cleaster Corin PATEL  Sonographer Comments: Technically difficult study due to poor echo windows and patient is obese. IMPRESSIONS  1. Left ventricular ejection fraction, by estimation, is 20 to 25%. The left ventricle has severely decreased function. The left ventricle demonstrates global hypokinesis. The left ventricular internal cavity size was mildly dilated. Left ventricular diastolic parameters are consistent with Grade II diastolic dysfunction (pseudonormalization).  2. Right ventricular systolic function is normal. The right ventricular  size is normal.  3. Left atrial size was severely dilated.  4. The mitral valve is normal in structure. Mild mitral valve regurgitation. No evidence of mitral stenosis.  5. The aortic valve is tricuspid. Aortic valve regurgitation is not visualized. No aortic stenosis is present.  6. The inferior vena cava is dilated in size with <50% respiratory variability, suggesting right atrial pressure of 15 mmHg. FINDINGS  Left Ventricle: Left ventricular ejection fraction, by estimation, is 20 to 25%. The left ventricle has severely decreased function. The left ventricle demonstrates global hypokinesis. Definity contrast agent was given IV to delineate the left ventricular endocardial borders. The left ventricular internal cavity size was mildly dilated. There is no left ventricular hypertrophy. Left ventricular diastolic parameters are consistent with Grade II diastolic dysfunction (pseudonormalization). Right Ventricle: The right ventricular size is normal. No increase in right ventricular wall thickness. Right ventricular systolic function is normal. Left Atrium: Left atrial size was severely dilated. Right Atrium: Right atrial size was normal in size. Pericardium: There is no evidence of pericardial effusion. Mitral Valve: The mitral valve is normal in structure. Mild mitral valve regurgitation, with posteriorly-directed jet. No evidence of mitral valve stenosis. Tricuspid Valve: The tricuspid valve is normal in structure. Tricuspid valve regurgitation is not demonstrated. No evidence of tricuspid stenosis. Aortic Valve: The aortic valve is tricuspid. Aortic valve regurgitation is not visualized. No aortic stenosis is present. Pulmonic Valve: The pulmonic valve was normal in structure. Pulmonic valve regurgitation is not visualized. No evidence of pulmonic stenosis. Aorta: The aortic root is normal in size and structure. Venous: The inferior vena cava is dilated in size with less than 50% respiratory variability,  suggesting right atrial pressure of 15 mmHg. IAS/Shunts: No atrial level shunt detected by color flow Doppler.  LEFT VENTRICLE PLAX 2D LVIDd:         6.90 cm   Diastology LVIDs:         6.20 cm   LV e' medial:    3.78 cm/s LV PW:         1.10 cm   LV E/e' medial:  21.7 LV IVS:        1.00 cm   LV  e' lateral:   5.98 cm/s LVOT diam:     2.40 cm   LV E/e' lateral: 13.7 LV SV:         48 LV SV Index:   15 LVOT Area:     4.52 cm  RIGHT VENTRICLE RV S prime:     7.35 cm/s TAPSE (M-mode): 1.6 cm LEFT ATRIUM              Index        RIGHT ATRIUM           Index LA diam:        5.50 cm  1.76 cm/m   RA Area:     30.50 cm LA Vol (A2C):   177.0 ml 56.72 ml/m  RA Volume:   96.60 ml  30.95 ml/m LA Vol (A4C):   174.0 ml 55.75 ml/m LA Biplane Vol: 176.0 ml 56.39 ml/m  AORTIC VALVE LVOT Vmax:   77.13 cm/s LVOT Vmean:  48.600 cm/s LVOT VTI:    0.107 m  AORTA Ao Root diam: 3.40 cm Ao Asc diam:  3.80 cm MITRAL VALVE MV Area (PHT): 6.37 cm    SHUNTS MV Decel Time: 119 msec    Systemic VTI:  0.11 m MV E velocity: 82.00 cm/s  Systemic Diam: 2.40 cm MV A velocity: 53.80 cm/s MV E/A ratio:  1.52 Candee Furbish MD Electronically signed by Candee Furbish MD Signature Date/Time: 05/10/2022/12:07:30 PM    Final     Assessment & Plan    Patient is a 47 year old M known to have HTN, morbid obesity, 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  He was eventually transferred to [facility] for management of ADHF and [facility].  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 Lasix 40 mg once daily -Increase carvedilol from 3.125 mg to 6.25 mg twice daily (due to 4 beat run of nonsustained V. tach last night) -Continue Entresto 24-26 mg twice daily -Continue spironolactone 12.5 mg  daily -Continue Farxiga 10 mg once daily -Patient scheduled for right heart cath and left heart cath on 05/13/2022. Risks and benefits of cardiac catheterization have been discussed with the patient. These include bleeding, infection, kidney damage, stroke, heart attack, death. The patient understands these risks and is willing to proceed.  # Chest pain/minimally elevated troponin at the time of admission likely secondary to CHF. He scheduled for RHC and LHC on 05/13/2022.  # HTN, controlled -Switch IV Lasix to p.o. Lasix 40 mg once daily -Increase carvedilol from 3.125 mg to 6.25 mg twice daily -Continue Entresto 24-26 mg twice daily -Continue spironolactone 12.5 mg once daily  # Morbid obesity -Plan for outpatient sleep study per prior note and weight loss counseling.  I have spent a total of 33 minutes with patient reviewing chart , telemetry, EKGs, labs and examining patient as well as establishing an assessment and plan that was discussed with the patient.  > 50% of time was spent in direct patient care.     Signed, Chalmers Guest, MD  05/12/2022, 8:05 AM

## 2022-05-13 ENCOUNTER — Encounter (HOSPITAL_COMMUNITY)
Admission: EM | Disposition: A | Discharge status: Home / Self Care | Payer: Self-pay | Source: Home / Self Care | Attending: Family Medicine

## 2022-05-13 ENCOUNTER — Encounter (HOSPITAL_COMMUNITY): Payer: Self-pay | Admitting: Internal Medicine

## 2022-05-13 ENCOUNTER — Telehealth (HOSPITAL_COMMUNITY): Payer: Self-pay | Admitting: Pharmacy Technician

## 2022-05-13 ENCOUNTER — Other Ambulatory Visit (HOSPITAL_COMMUNITY): Payer: Self-pay

## 2022-05-13 DIAGNOSIS — I5021 Acute systolic (congestive) heart failure: Secondary | ICD-10-CM | POA: Diagnosis not present

## 2022-05-13 HISTORY — PX: RIGHT/LEFT HEART CATH AND CORONARY ANGIOGRAPHY: CATH118266

## 2022-05-13 LAB — POCT I-STAT EG7
Acid-Base Excess: 1 mmol/L (ref 0.0–2.0)
Acid-Base Excess: 1 mmol/L (ref 0.0–2.0)
Bicarbonate: 27.9 mmol/L (ref 20.0–28.0)
Bicarbonate: 27.3 mmol/L (ref 20.0–28.0)
Calcium, Ion: 1.27 mmol/L (ref 1.15–1.40)
Calcium, Ion: 1.31 mmol/L (ref 1.15–1.40)
HCT: 47 % (ref 39.0–52.0)
HCT: 47 % (ref 39.0–52.0)
Hemoglobin: 16 g/dL (ref 13.0–17.0)
Hemoglobin: 16 g/dL (ref 13.0–17.0)
O2 Saturation: 62 %
O2 Saturation: 62 %
Potassium: 4.5 mmol/L (ref 3.5–5.1)
Potassium: 4.6 mmol/L (ref 3.5–5.1)
Sodium: 137 mmol/L (ref 135–145)
Sodium: 137 mmol/L (ref 135–145)
TCO2: 29 mmol/L (ref 22–32)
TCO2: 29 mmol/L (ref 22–32)
pCO2, Ven: 50.4 mmHg (ref 44–60)
pCO2, Ven: 50.8 mmHg (ref 44–60)
pH, Ven: 7.352 (ref 7.25–7.43)
pH, Ven: 7.339 (ref 7.25–7.43)
pO2, Ven: 34 mmHg (ref 32–45)
pO2, Ven: 35 mmHg (ref 32–45)

## 2022-05-13 LAB — CBC
HCT: 48.7 % (ref 39.0–52.0)
HCT: 47.7 % (ref 39.0–52.0)
Hemoglobin: 15.8 g/dL (ref 13.0–17.0)
Hemoglobin: 16.2 g/dL (ref 13.0–17.0)
MCH: 27.4 pg (ref 26.0–34.0)
MCH: 28.3 pg (ref 26.0–34.0)
MCHC: 32.4 g/dL (ref 30.0–36.0)
MCHC: 34 g/dL (ref 30.0–36.0)
MCV: 84.4 fL (ref 80.0–100.0)
MCV: 83.4 fL (ref 80.0–100.0)
Platelets: 230 10*3/uL (ref 150–400)
Platelets: 237 10*3/uL (ref 150–400)
RBC: 5.77 MIL/uL (ref 4.22–5.81)
RBC: 5.72 MIL/uL (ref 4.22–5.81)
RDW: 13.4 % (ref 11.5–15.5)
RDW: 13.4 % (ref 11.5–15.5)
WBC: 6.7 10*3/uL (ref 4.0–10.5)
WBC: 7 10*3/uL (ref 4.0–10.5)
nRBC: 0 % (ref 0.0–0.2)
nRBC: 0 % (ref 0.0–0.2)

## 2022-05-13 LAB — POCT I-STAT 7, (LYTES, BLD GAS, ICA,H+H)
Acid-Base Excess: 2 mmol/L (ref 0.0–2.0)
Bicarbonate: 28.1 mmol/L — ABNORMAL HIGH (ref 20.0–28.0)
Calcium, Ion: 1.31 mmol/L (ref 1.15–1.40)
HCT: 46 % (ref 39.0–52.0)
Hemoglobin: 15.6 g/dL (ref 13.0–17.0)
O2 Saturation: 96 %
Potassium: 4.5 mmol/L (ref 3.5–5.1)
Sodium: 136 mmol/L (ref 135–145)
TCO2: 30 mmol/L (ref 22–32)
pCO2 arterial: 48.2 mmHg — ABNORMAL HIGH (ref 32–48)
pH, Arterial: 7.374 (ref 7.35–7.45)
pO2, Arterial: 84 mmHg (ref 83–108)

## 2022-05-13 LAB — CREATININE, SERUM
Creatinine, Ser: 1.13 mg/dL (ref 0.61–1.24)
GFR, Estimated: >60 mL/min (ref >60)

## 2022-05-13 LAB — BASIC METABOLIC PANEL
Anion gap: 12 (ref 5–15)
BUN: 15 mg/dL (ref 6–20)
CO2: 24 mmol/L (ref 22–32)
Calcium: 9.5 mg/dL (ref 8.9–10.3)
Chloride: 100 mmol/L (ref 98–111)
Creatinine, Ser: 1.18 mg/dL (ref 0.61–1.24)
GFR, Estimated: >60 mL/min (ref >60)
Glucose, Bld: 104 mg/dL — ABNORMAL HIGH (ref 70–99)
Potassium: 4.4 mmol/L (ref 3.5–5.1)
Sodium: 136 mmol/L (ref 135–145)

## 2022-05-13 LAB — TSH: TSH: 2.328 u[IU]/mL (ref 0.350–4.500)

## 2022-05-13 SURGERY — RIGHT/LEFT HEART CATH AND CORONARY ANGIOGRAPHY
Anesthesia: LOCAL

## 2022-05-13 MED ORDER — FENTANYL CITRATE (PF) 100 MCG/2ML IJ SOLN
INTRAMUSCULAR | Status: AC
  Filled 2022-05-13: qty 2

## 2022-05-13 MED ORDER — SODIUM CHLORIDE 0.9% FLUSH: 3.0000 mL | Freq: Two times a day (BID) | INTRAVENOUS | Status: DC

## 2022-05-13 MED ORDER — FENTANYL CITRATE (PF) 100 MCG/2ML IJ SOLN
INTRAMUSCULAR | Status: DC | PRN
  Administered 2022-05-13: 25 ug via INTRAVENOUS

## 2022-05-13 MED ORDER — VERAPAMIL HCL 2.5 MG/ML IV SOLN
INTRAVENOUS | Status: AC
  Filled 2022-05-13: qty 2

## 2022-05-13 MED ORDER — VERAPAMIL HCL 2.5 MG/ML IV SOLN
INTRAVENOUS | Status: DC | PRN
  Administered 2022-05-13: 10 mL via INTRA_ARTERIAL

## 2022-05-13 MED ORDER — SODIUM CHLORIDE 0.9 % IV SOLN: 250.0000 mL | INTRAVENOUS | Status: DC | PRN

## 2022-05-13 MED ORDER — CARVEDILOL 3.125 MG PO TABS
3.1250 mg | ORAL_TABLET | Freq: Two times a day (BID) | ORAL | 3 refills | Status: DC
  Filled 2022-05-13 – 2022-06-16 (×2): qty 60, 30d supply, fill #0

## 2022-05-13 MED ORDER — POTASSIUM CHLORIDE CRYS ER 20 MEQ PO TBCR
20.0000 meq | EXTENDED_RELEASE_TABLET | Freq: Every day | ORAL | Status: DC
  Administered 2022-05-13: 20 meq via ORAL
  Filled 2022-05-13: qty 1

## 2022-05-13 MED ORDER — FUROSEMIDE 40 MG PO TABS
40.0000 mg | ORAL_TABLET | Freq: Every day | ORAL | 3 refills | Status: DC
  Filled 2022-05-13: qty 30, 30d supply, fill #0
  Filled 2022-06-13: qty 30, 30d supply, fill #1
  Filled 2022-06-16: qty 30, 30d supply, fill #0
  Filled 2022-07-16: qty 30, 30d supply, fill #1
  Filled 2022-08-09: qty 30, 30d supply, fill #2

## 2022-05-13 MED ORDER — MIDAZOLAM HCL 2 MG/2ML IJ SOLN
INTRAMUSCULAR | Status: DC | PRN
  Administered 2022-05-13: 2 mg via INTRAVENOUS

## 2022-05-13 MED ORDER — HEPARIN SODIUM (PORCINE) 5000 UNIT/ML IJ SOLN: 5000.0000 [IU] | Freq: Three times a day (TID) | INTRAMUSCULAR | Status: DC

## 2022-05-13 MED ORDER — SODIUM CHLORIDE 0.9% FLUSH: 3.0000 mL | INTRAVENOUS | Status: DC | PRN

## 2022-05-13 MED ORDER — EMPAGLIFLOZIN 10 MG PO TABS
10.0000 mg | ORAL_TABLET | Freq: Every day | ORAL | Status: DC
  Administered 2022-05-13: 10 mg via ORAL
  Filled 2022-05-13: qty 1

## 2022-05-13 MED ORDER — HEPARIN (PORCINE) IN NACL 1000-0.9 UT/500ML-% IV SOLN
INTRAVENOUS | Status: DC | PRN
  Administered 2022-05-13 (×2): 500 mL

## 2022-05-13 MED ORDER — SPIRONOLACTONE 25 MG PO TABS
12.5000 mg | ORAL_TABLET | Freq: Every day | ORAL | 3 refills | Status: DC
  Filled 2022-05-13: qty 15, 30d supply, fill #0
  Filled 2022-06-05: qty 15, 30d supply, fill #1
  Filled 2022-07-06: qty 15, 30d supply, fill #2
  Filled 2022-07-16: qty 15, 30d supply, fill #3

## 2022-05-13 MED ORDER — EMPAGLIFLOZIN 10 MG PO TABS
10.0000 mg | ORAL_TABLET | Freq: Every day | ORAL | 3 refills | Status: DC
  Filled 2022-05-13: qty 30, 30d supply, fill #0
  Filled 2022-06-13: qty 30, 30d supply, fill #1
  Filled 2022-06-16: qty 30, 30d supply, fill #0
  Filled 2022-07-16: qty 30, 30d supply, fill #1
  Filled 2022-08-09: qty 30, 30d supply, fill #2

## 2022-05-13 MED ORDER — HEPARIN (PORCINE) IN NACL 1000-0.9 UT/500ML-% IV SOLN
INTRAVENOUS | Status: AC
  Filled 2022-05-13: qty 1000

## 2022-05-13 MED ORDER — HEPARIN SODIUM (PORCINE) 1000 UNIT/ML IJ SOLN
INTRAMUSCULAR | Status: AC
  Filled 2022-05-13: qty 10

## 2022-05-13 MED ORDER — LIDOCAINE HCL (PF) 1 % IJ SOLN
INTRAMUSCULAR | Status: AC
  Filled 2022-05-13: qty 30

## 2022-05-13 MED ORDER — SACUBITRIL-VALSARTAN 24-26 MG PO TABS
1.0000 | ORAL_TABLET | Freq: Two times a day (BID) | ORAL | 3 refills | Status: DC
  Filled 2022-05-13: qty 60, 30d supply, fill #0
  Filled 2022-06-13: qty 60, 30d supply, fill #1
  Filled 2022-06-16: qty 60, 30d supply, fill #0

## 2022-05-13 MED ORDER — POTASSIUM CHLORIDE CRYS ER 20 MEQ PO TBCR
20.0000 meq | EXTENDED_RELEASE_TABLET | Freq: Every day | ORAL | 3 refills | Status: DC
  Filled 2022-05-13: qty 30, 30d supply, fill #0
  Filled 2022-06-13: qty 30, 30d supply, fill #1
  Filled 2022-06-16: qty 30, 30d supply, fill #0
  Filled 2022-07-16: qty 30, 30d supply, fill #1

## 2022-05-13 MED ORDER — MIDAZOLAM HCL 2 MG/2ML IJ SOLN
INTRAMUSCULAR | Status: AC
  Filled 2022-05-13: qty 2

## 2022-05-13 MED ORDER — HEPARIN SODIUM (PORCINE) 1000 UNIT/ML IJ SOLN
INTRAMUSCULAR | Status: DC | PRN
  Administered 2022-05-13: 6000 [IU] via INTRAVENOUS

## 2022-05-13 MED ORDER — LIDOCAINE HCL (PF) 1 % IJ SOLN
INTRAMUSCULAR | Status: DC | PRN
  Administered 2022-05-13 (×2): 2 mL

## 2022-05-13 MED ORDER — IOHEXOL 350 MG/ML SOLN
INTRAVENOUS | Status: DC | PRN
  Administered 2022-05-13: 55 mL

## 2022-05-13 SURGICAL SUPPLY — 16 items
BAND ZEPHYR COMPRESS 30 LONG (HEMOSTASIS) IMPLANT
CATH 5FR JL3.5 JR4 ANG PIG MP (CATHETERS) IMPLANT
CATH SWAN GANZ 7F STRAIGHT (CATHETERS) IMPLANT
DEVICE RAD COMP TR BAND LRG (VASCULAR PRODUCTS) IMPLANT
GLIDESHEATH SLEND SS 6F .021 (SHEATH) IMPLANT
GLIDESHEATH SLENDER 7FR .021G (SHEATH) IMPLANT
GUIDEWIRE INQWIRE 1.5J.035X260 (WIRE) IMPLANT
INQWIRE 1.5J .035X260CM (WIRE) ×1
KIT HEART LEFT (KITS) ×1 IMPLANT
MAT PREVALON FULL STRYKER (MISCELLANEOUS) IMPLANT
PACK CARDIAC CATHETERIZATION (CUSTOM PROCEDURE TRAY) ×1 IMPLANT
SHEATH PROBE COVER 6X72 (BAG) IMPLANT
SYR MEDRAD MARK 7 150ML (SYRINGE) ×1 IMPLANT
TRANSDUCER W/STOPCOCK (MISCELLANEOUS) ×1 IMPLANT
TUBING CIL FLEX 10 FLL-RA (TUBING) ×1 IMPLANT
WIRE EMERALD 3MM-J .025X260CM (WIRE) IMPLANT

## 2022-05-13 NOTE — Progress Notes (Signed)
Patient off unit in cath lab at start of shift.

## 2022-05-13 NOTE — Interval H&P Note (Signed)
History and Physical Interval Note:  05/13/2022 7:16 AM  Sherin Quarry  has presented today for surgery, with the diagnosis of HF.  The various methods of treatment have been discussed with the patient and family. After consideration of risks, benefits and other options for treatment, the patient has consented to  Procedure(s): RIGHT/LEFT HEART CATH AND CORONARY ANGIOGRAPHY (N/A) as a surgical intervention.  The patient's history has been reviewed, patient examined, no change in status, stable for surgery.  I have reviewed the patient's chart and labs.  Questions were answered to the patient's satisfaction.   Cath Lab Visit (complete for each Cath Lab visit)  Clinical Evaluation Leading to the Procedure:   ACS: Yes.    Non-ACS:    Anginal Classification: CCS III  Anti-ischemic medical therapy: No Therapy  Non-Invasive Test Results: No non-invasive testing performed  Prior CABG: No previous CABG        Collier [facility] 05/13/2022 7:16 AM

## 2022-05-13 NOTE — TOC Benefit Eligibility Note (Signed)
Patient Teacher, English as a foreign language completed.    The patient is currently admitted and upon discharge could be taking Jardiance 10 mg.  The current 30 day co-pay is $5.00.   The patient is currently admitted and upon discharge could be taking Farixga 10 mg.  Not Covered  The patient is currently admitted and upon discharge could be taking Entresto 24-26 mg.  Prior Authorization Required  The patient is insured through [place], [place] Patient [place] Patient Advocate Team Direct Number: [phone]  Fax: [phone]

## 2022-05-13 NOTE — Progress Notes (Signed)
Patient returned from cath lab at 0847hrs. Zephyr band in place with 12cc of air.  No hematoma noted.  Reviewed  post cath instructions.  Patient verbalized understanding.

## 2022-05-13 NOTE — Progress Notes (Signed)
   Heart Failure Stewardship Pharmacist Progress Note   PCP: Shawna Orleans, Doe-Hyun R, DO (Inactive) PCP-Cardiologist: None    HPI:  47 yo M with PMH of morbid obesity and HTN.   He presented to the ED on 12/29 with shortness of breath x 1 month, LE edema, orthopnea, and chest tightness. Has not seen a doctor in 6 years. CXR with increased cardiomegaly, edema vs infection. ECHO 12/30 with LVEF 20-25%, global hypokinesis, G2DD, RV normal. R/LHC on 1/2 showed normal coronary anatomy, RA 9, PA 27, wedge 22, CO 5.6, CI 1.8.  Current HF Medications: Diuretic: furosemide 40 mg PO daily Beta Blocker: carvedilol 3.125 mg BID ACE/ARB/ARNI: Entresto 24/26 mg BID MRA: spironolactone 12.5 mg daily SGLT2i: Jardiance 10 mg daily  Prior to admission HF Medications: None  Pertinent Lab Values: Serum creatinine 1.13, BUN 15, Potassium 4.5, Sodium 136, BNP 406.6, Magnesium 2.2  Vital Signs: Weight: 423 lbs (admission weight: 448 lbs) Blood pressure: 120/80s  Heart rate: 70-80s  I/O: -1.9L yesterday; net -7L  Medication Assistance / Insurance Benefits Check: Does the patient have prescription insurance?  Yes Type of insurance plan: [place]:  Prior to admission outpatient [facility] Is the patient willing to use [facility] at discharge? Yes Is the patient willing to transition their outpatient pharmacy to utilize a [facility] outpatient pharmacy?   Pending    Assessment: 1. Acute systolic CHF (LVEF 82-50%), due to NICM. NYHA class II symptoms. - Continue furosemide 40 mg PO daily. Strict I/Os and daily weights. Keep K>4 and Mag>2. - CI on cath 1.8, caution carvedilol 3.125 mg BID, may need to hold.  - Continue Entresto 24/26 mg BID - Continue spironolactone 12.5 mg daily, consider increasing to target 25 mg daily for discharge - Continue Jardiance 10 mg daily  Plan: 1) Medication changes recommended at this time: - Increase spironolactone to 25 mg daily  2) Patient  assistance: - Has commercial insurance, can use monthly copay cards  3)  Education  - To be completed prior to discharge  Kerby Nora, PharmD, BCPS Heart Failure Cytogeneticist Phone [phone]

## 2022-05-13 NOTE — Progress Notes (Signed)
Heart Failure Nurse Navigator Progress Note  PCP: Shawna Orleans, Doe-Hyun R, DO (Inactive) PCP-Cardiologist: None Admission Diagnosis: None Admitted from: Urgent care to [facility] to [facility]  Presentation:   Jeffery Benson presented with 1 moth of shortness of breath, LE edema, chest tightness. was playing tennis , then he couldn't breathe well, so stopped playing, went to urgent care was noted to have EKG changes then sent to hospital for further evaluation. Patient hasn't been to a doctor in 6 years and is currently on no medications. BP 149/111, HR 109, BNP 406.6, Troponin 119, BMI 51.50, CXR showed mild pulmonary edema.  IV heparin started. R/L heart cath nonischemic cardiomyopathy on 05/13/22 1/2 showed normal coronary anatomy.   Patient and his father ( at bedside) were educated on the sign and symptoms of heart failure, daily weights, when to call his doctor or go to the ED. Diet/ fluid restrictions, salt intake, taking all his medications as prescribed, and attending all medical appointments. Patient verbalized his understanding of the education. A HF TOC appointment was scheduled for  05/27/22.   ECHO/ LVEF: 20-25% new.   Clinical Course:  Past Medical History:  Diagnosis Date   History of chickenpox    Hypertension      Social History   Socioeconomic History   Marital status: Married    Spouse name: Not on file   Number of children: 2   Years of education: Not on file   Highest education level: Not on file  Occupational History   Not on file  Tobacco Use   Smoking status: Never   Smokeless tobacco: Never  Substance and Sexual Activity   Alcohol use: Yes    Comment: rare   Drug use: No   Sexual activity: Yes  Other Topics Concern   Not on file  Social History Narrative   Not on file   Social Determinants of Health   Financial Resource Strain: Not on file  Food Insecurity: Not on file  Transportation Needs: Not on file  Physical Activity: Not on file  Stress: Not on  file  Social Connections: Not on file   Education Assessment and Provision:  Detailed education and instructions provided on heart failure disease management including the following:  Signs and symptoms of Heart Failure When to call the physician Importance of daily weights Low sodium diet Fluid restriction Medication management Anticipated future follow-up appointments  Patient education given on each of the above topics.  Patient acknowledges understanding via teach back method and acceptance of all instructions.  Education Materials:  "Living Better With Heart Failure" Booklet, HF zone tool, & Daily Weight Tracker Tool.  Patient has scale at home: yes Patient has pill box at home: NA    High Risk Criteria for Readmission and/or Poor Patient Outcomes: Heart failure hospital admissions (last 6 months): 0  No Show rate: 0 Difficult social situation: No Demonstrates medication adherence: hasn't been on any for over 6 years Primary Language: English Literacy level: reading, writing, and comprehension.   Barriers of Care:   New diagnosis of HF, continued education Medication compliance Diet/ fluid restrictions Daily weights  Considerations/Referrals:   Referral made to Heart Failure Pharmacist Stewardship: yes Referral made to Heart Failure CSW/NCM TOC: No Referral made to Heart & Vascular TOC clinic: Yes, 05/27/22  Items for Follow-up on DC/TOC: New diagnosis of HF , continued education Medication compliance ( all new meds)  Diet/ fluids/ daily weights   Earnestine Leys, BSN, RN Heart Failure Art therapist  Secure Chat Only

## 2022-05-13 NOTE — Telephone Encounter (Signed)
Patient Advocate Encounter  Prior Authorization for Entresto 24-26MG  tablets has been approved.    PA# [id number] Effective dates: 05/13/2022 through 05/14/2023  Patients co-pay is $639.42 due to a $3,200.00 deductible remaining.     Lyndel Safe, [place] Patient Advocate Specialist [place] Patient Advocate Team Direct Number: [phone]  Fax: [phone]

## 2022-05-13 NOTE — Telephone Encounter (Signed)
Patient Advocate Encounter   Received notification that prior authorization for Entresto 24-26MG  tablets is required.   PA submitted on 05/13/2022 Key [id number] Status is pending       Lyndel Safe, [place] Patient Advocate Specialist [place] Patient Advocate Team Direct Number: [phone]  Fax: [phone]

## 2022-05-13 NOTE — Progress Notes (Signed)
Rounding Note    Patient Name: Jeffery Benson Date of Encounter: 05/13/2022  [place] Cardiologist: New  Subjective   Denies CP or dyspnea  Inpatient Medications    Scheduled Meds:  [START ON 05/14/2022] aspirin  81 mg Oral Daily   carvedilol  3.125 mg Oral BID WC   dapagliflozin propanediol  10 mg Oral Daily   furosemide  40 mg Oral Daily   heparin injection (subcutaneous)  5,000 Units Subcutaneous Q8H   potassium chloride  40 mEq Oral BID   sacubitril-valsartan  1 tablet Oral BID   sodium chloride flush  3 mL Intravenous Q12H   sodium chloride flush  3 mL Intravenous Q12H   spironolactone  12.5 mg Oral Daily   Continuous Infusions:  sodium chloride     sodium chloride     PRN Meds: sodium chloride, acetaminophen **OR** acetaminophen, ondansetron **OR** ondansetron (ZOFRAN) IV, senna-docusate, sodium chloride flush   Vital Signs    Vitals:   05/12/22 1218 05/12/22 1735 05/12/22 2146 05/13/22 0442  BP: 100/86 (!) 125/111 105/83 123/89  Pulse: 86 92 92 91  Resp: 16  18 17   Temp: 98.7 F (37.1 C)  97.9 F (36.6 C) 98.2 F (36.8 C)  TempSrc: Oral  Oral Oral  SpO2: 91%  90% 96%  Weight:    (!) 191.9 kg  Height:        Intake/Output Summary (Last 24 hours) at 05/13/2022 0719 Last data filed at 05/13/2022 0600 Gross per 24 hour  Intake 120 ml  Output 2200 ml  Net -2080 ml       05/13/2022    4:42 AM 05/12/2022    3:24 AM 05/11/2022    5:14 AM  Last 3 Weights  Weight (lbs) 423 lb 1 oz 425 lb 7.8 oz 422 lb 9.6 oz  Weight (kg) 191.9 kg 193 kg 191.69 kg      Telemetry    Sinus with transient Mobitz 1 second degree AV block approximately 2 am- Personally Reviewed   Physical Exam   GEN: NAD.  Obese Neck: supple Cardiac: RRR Respiratory: CTA GI: Soft, NT/ND MS: No edema; radial cath site with no hematoma Neuro: Grossly intact Psych: Normal affect   Labs    High Sensitivity Troponin:   Recent Labs  Lab 05/09/22 1736 05/09/22 2008  05/09/22 2121 05/10/22 0100  TROPONINIHS 92* 119* 109* 57*      Chemistry Recent Labs  Lab 05/10/22 0500 05/11/22 0129 05/12/22 0148 05/13/22 0258  NA 139 138 135 136  K 3.7 3.6 4.0 4.4  CL 107 101 100 100  CO2 26 24 25 24   GLUCOSE 116* 118* 107* 104*  BUN 15 13 18 15   CREATININE 1.09 1.25* 1.31* 1.18  CALCIUM 8.7* 9.4 9.4 9.5  MG 2.0  --  2.2  --   GFRNONAA >60 >60 >60 >60  ANIONGAP 6 13 10 12       Hematology Recent Labs  Lab 05/11/22 0710 05/12/22 0148 05/13/22 0258  WBC 7.9 7.4 6.7  RBC 5.60 5.75 5.77  HGB 16.0 16.2 15.8  HCT 46.0 48.5 48.7  MCV 82.1 84.3 84.4  MCH 28.6 28.2 27.4  MCHC 34.8 33.4 32.4  RDW 13.5 13.5 13.4  PLT 231 248 230     BNP Recent Labs  Lab 05/09/22 1736  BNP 406.6*     Patient Profile     47 y.o. male with past medical history of hypertension admitted with acute systolic congestive heart failure.  Echocardiogram shows ejection fraction 20 to 25%, mild left ventricular enlargement, grade 2 diastolic dysfunction, severe left atrial enlargement, mild mitral regurgitation.  Assessment & Plan    1 acute systolic congestive heart failure-I/O-7000 L since admission.  Symptomatically improved.  Continue Lasix, spironolactone and Farxiga at present dose.  Continue to follow renal function.  2 cardiomyopathy-etiology unclear but felt possibly secondary to uncontrolled hypertension. Cath shows no CAD. Check TSH.  Continue Entresto and carvedilol.  Titrate medications as an outpatient.  Will need repeat echocardiogram 3 months after medications fully titrated and blood pressure controlled.  3 chest pain/elevated troponin-Cath reveals no CAD.  4 hypertension-blood pressure has improved.  Will follow and adjust medications as needed.  5 morbid obesity-plan outpatient sleep study to rule out sleep apnea.  Needs weight loss.  6 transient Mobitz 1 second-degree AV block-early a.m. hours.  Likely related to sleep apnea.  Will continue Coreg  at present dose.  Follow on telemetry.  Pt can be DCed from a cardiac standpoint on present meds; will arrange FU in CHF clinic for med titration; FU with me in 3 months; check BMET one week; arrange outpt sleep study.  For questions or updates, please contact [place] Please consult [email or web address] for contact info under        Signed, Kirk Ruths, MD  05/13/2022, 7:19 AM

## 2022-05-13 NOTE — Discharge Summary (Signed)
Physician Discharge Summary   Patient: Jeffery Benson MRN: [id number] DOB: 09/15/1975  Admit date:     05/09/2022  Discharge date: 05/13/22  Discharge Physician: Edwin Dada   PCP: Shawna Orleans, Doe-Hyun R, DO (Inactive)     Recommendations at discharge:  Follow up with new PCP when able to obtain Follow up with heart and vascular center for nonischemic cardiomyopathy on Jan 16 Please refer for PSG     Discharge Diagnoses: Principal Problem:   Acute systolic CHF (congestive heart failure) ([name]) Active Problems:   Myocardial injury   Obesity   At risk for sleep apnea   NSVT (nonsustained ventricular tachycardia) [facility])     Hospital Course: [name] is a 47 y.o. M with MO BMI 51 who presented with chest discomfort.     12/29: Admitted  12/30: Echo showed EF 20-25%, Cardiology consulted 12/31: Transferred to [facility] for [place]   * Acute systolic CHF (congestive heart failure) ([name]) Admitted on Lasix.  No prior CHF.   Echo here showed EF 20-25%, Cardiology consulted, diuresed to euvoelmia.    Underwent R/LHC that showed no significant coronary disease, mildly elevated LVEDP.   Discharged on GDMT, has Cardiology follow up        Myocardial injury Due to CHF  NSVT (nonsustained ventricular tachycardia) ([place])  At risk for sleep apnea Recommend outpatient PSG  Obesity Morbid obesity BMI 51            The [facility] [place] Controlled Substances Registry was reviewed for this patient prior to discharge.  Consultants: Cardiology Procedures performed: Echocardiogram, Left heart cath  Disposition: Home Diet recommendation:  Discharge Diet Orders (From admission, onward)     Start     Ordered   05/13/22 0000  Diet - low sodium heart healthy        05/13/22 1203             DISCHARGE MEDICATION: Allergies as of 05/13/2022   No Known Allergies      Medication List     TAKE these medications    carvedilol 3.125 MG tablet Commonly known as:  COREG Take 1 tablet (3.125 mg total) by mouth 2 (two) times daily with a meal.   Entresto 24-26 MG Generic drug: sacubitril-valsartan Take 1 tablet by mouth 2 (two) times daily.   furosemide 40 MG tablet Commonly known as: LASIX Take 1 tablet (40 mg total) by mouth daily. Start taking on: May 14, 2022   Jardiance 10 MG Tabs tablet Generic drug: empagliflozin Take 1 tablet (10 mg total) by mouth daily. Start taking on: May 14, 2022   potassium chloride SA 20 MEQ tablet Commonly known as: KLOR-CON M Take 1 tablet (20 mEq total) by mouth daily. Start taking on: May 14, 2022   spironolactone 25 MG tablet Commonly known as: ALDACTONE Take 1/2 tablet (12.5 mg total) by mouth daily. Start taking on: May 14, 2022        Follow-up Information     Primary care Follow up.   Why: See your new primary care doctor (see below, Dr. Eulas Post) for an appointment on Jan 9th                Discharge Instructions     Diet - low sodium heart healthy   Complete by: As directed    Discharge instructions   Complete by: As directed    **IMPORTANT DISCHARGE INSTRUCTIONS**   From Dr. Loleta Books: You were admitted for a flare of heart failure  You were treated with diuretics and the flare resolved  You had an echocardiogram that showed chronic heart failure We have started several medicine to manage this, and they are listed below.  They will help make your symptoms less, and reduce the changes of future flare ups  Take: Furosemide/Lasix 40 mg daily (Take this with a potassium supplement)  Take the following blood pressure and "heart protecting" medicines: Carvedilol/Coreg 3.125 mg twice daily Empagliflozin/Jardiance 10 mg daily Spironolactone/Aldactone 25 mg daily Sacubitril-valsartan/Entresto 24-26 mg twice daily    See your new PCP on Jan 9  See the heart team at the Heart and vascular center on Jan 16   Your chronic heart failure instructions are listed below  under the heading "Instructions for Heart failure"  Your post-cath restrictions are listed below under: "Instructions Radial Site Care"   Increase activity slowly   Complete by: As directed        Discharge Exam: [name] [employer]ights   05/11/22 0514 05/12/22 0324 05/13/22 0442  Weight: (!) 191.7 kg (!) 193 kg (!) 191.9 kg    General: Pt is alert, awake, not in acute distress Cardiovascular: RRR, nl S1-S2, no murmurs appreciated.   No LE edema.   Respiratory: Normal respiratory rate and rhythm.  CTAB without rales or wheezes. Abdominal: Abdomen soft and non-tender.  No distension or HSM.   Neuro/Psych: Strength symmetric in upper and lower extremities.  Judgment and insight appear normal.   Condition at discharge: good  The results of significant diagnostics from this hospitalization (including imaging, microbiology, ancillary and laboratory) are listed below for reference.   Imaging Studies: CARDIAC CATHETERIZATION  Result Date: 05/15/4816   LV end diastolic pressure is mildly elevated.   Hemodynamic findings consistent with mild pulmonary hypertension. Normal coronary anatomy Mildly elevated LV filling pressures. PCWP 26/24 with mean 22 mm Hg. LVEDP 32 mm Hg Mild pulmonary HTN PAP 46/12 with mean 27 mm Hg Reduced Cardiac output 5.56 liters/min with index 1.82 Plan; optimize medical therapy for CHF.   ECHOCARDIOGRAM COMPLETE  Result Date: 05/10/2022    ECHOCARDIOGRAM REPORT   Patient Name:   BANKS Jeffery Date of Exam: 05/10/2022 Medical Rec #:  [id number]      Height:       76.0 in Accession #:    [id number]     Weight:       448.0 lb Date of Birth:  1976/03/08       BSA:          3.121 m Patient Age:    47 years       BP:           145/99 mmHg Patient Gender: M              HR:           92 bpm. Exam Location:  Inpatient Procedure: 2D Echo, Cardiac Doppler, Color Doppler and Intracardiac            Opacification Agent Indications:    Congestive Heart Failure I50.9  History:        Patient  has no prior history of Echocardiogram examinations.                 CHF.  Sonographer:    Bernadene Person RDCS Referring Phys: [id number] Cleaster Corin PATEL  Sonographer Comments: Technically difficult study due to poor echo windows and patient is obese. IMPRESSIONS  1. Left ventricular ejection fraction, by estimation, is 20 to 25%. The left ventricle  has severely decreased function. The left ventricle demonstrates global hypokinesis. The left ventricular internal cavity size was mildly dilated. Left ventricular diastolic parameters are consistent with Grade II diastolic dysfunction (pseudonormalization).  2. Right ventricular systolic function is normal. The right ventricular size is normal.  3. Left atrial size was severely dilated.  4. The mitral valve is normal in structure. Mild mitral valve regurgitation. No evidence of mitral stenosis.  5. The aortic valve is tricuspid. Aortic valve regurgitation is not visualized. No aortic stenosis is present.  6. The inferior vena cava is dilated in size with <50% respiratory variability, suggesting right atrial pressure of 15 mmHg. FINDINGS  Left Ventricle: Left ventricular ejection fraction, by estimation, is 20 to 25%. The left ventricle has severely decreased function. The left ventricle demonstrates global hypokinesis. Definity contrast agent was given IV to delineate the left ventricular endocardial borders. The left ventricular internal cavity size was mildly dilated. There is no left ventricular hypertrophy. Left ventricular diastolic parameters are consistent with Grade II diastolic dysfunction (pseudonormalization). Right Ventricle: The right ventricular size is normal. No increase in right ventricular wall thickness. Right ventricular systolic function is normal. Left Atrium: Left atrial size was severely dilated. Right Atrium: Right atrial size was normal in size. Pericardium: There is no evidence of pericardial effusion. Mitral Valve: The mitral valve is normal in  structure. Mild mitral valve regurgitation, with posteriorly-directed jet. No evidence of mitral valve stenosis. Tricuspid Valve: The tricuspid valve is normal in structure. Tricuspid valve regurgitation is not demonstrated. No evidence of tricuspid stenosis. Aortic Valve: The aortic valve is tricuspid. Aortic valve regurgitation is not visualized. No aortic stenosis is present. Pulmonic Valve: The pulmonic valve was normal in structure. Pulmonic valve regurgitation is not visualized. No evidence of pulmonic stenosis. Aorta: The aortic root is normal in size and structure. Venous: The inferior vena cava is dilated in size with less than 50% respiratory variability, suggesting right atrial pressure of 15 mmHg. IAS/Shunts: No atrial level shunt detected by color flow Doppler.  LEFT VENTRICLE PLAX 2D LVIDd:         6.90 cm   Diastology LVIDs:         6.20 cm   LV e' medial:    3.78 cm/s LV PW:         1.10 cm   LV E/e' medial:  21.7 LV IVS:        1.00 cm   LV e' lateral:   5.98 cm/s LVOT diam:     2.40 cm   LV E/e' lateral: 13.7 LV SV:         48 LV SV Index:   15 LVOT Area:     4.52 cm  RIGHT VENTRICLE RV S prime:     7.35 cm/s TAPSE (M-mode): 1.6 cm LEFT ATRIUM              Index        RIGHT ATRIUM           Index LA diam:        5.50 cm  1.76 cm/m   RA Area:     30.50 cm LA Vol (A2C):   177.0 ml 56.72 ml/m  RA Volume:   96.60 ml  30.95 ml/m LA Vol (A4C):   174.0 ml 55.75 ml/m LA Biplane Vol: 176.0 ml 56.39 ml/m  AORTIC VALVE LVOT Vmax:   77.13 cm/s LVOT Vmean:  48.600 cm/s LVOT VTI:    0.107 m  AORTA Ao Root  diam: 3.40 cm Ao Asc diam:  3.80 cm MITRAL VALVE MV Area (PHT): 6.37 cm    SHUNTS MV Decel Time: 119 msec    Systemic VTI:  0.11 m MV E velocity: 82.00 cm/s  Systemic Diam: 2.40 cm MV A velocity: 53.80 cm/s MV E/A ratio:  1.52 Donato Schultz MD Electronically signed by Donato Schultz MD Signature Date/Time: 05/10/2022/12:07:30 PM    Final    DG Chest 2 View  Result Date: 05/09/2022 CLINICAL DATA:   Shortness of breath. EXAM: CHEST - 2 VIEW COMPARISON:  Chest radiographs 11/03/2012 FINDINGS: The cardiac silhouette is moderately enlarged which reflects a substantial increased from 2014. There are new symmetric hazy lung opacities bilaterally. No pleural effusion or pneumothorax is identified. A right shoulder arthroplasty is partially visualized. IMPRESSION: Increased cardiomegaly with new hazy lung opacities which could reflect edema, infection, or an inflammatory process. Electronically Signed   By: Sebastian Ache M.D.   On: 05/09/2022 16:46    Microbiology: Results for orders placed or performed during the hospital encounter of 05/09/22  Resp panel by RT-PCR (RSV, Flu A&B, Covid) Anterior Nasal Swab     Status: None   Collection Time: 05/09/22  9:17 PM   Specimen: Anterior Nasal Swab  Result Value Ref Range Status   SARS Coronavirus 2 by RT PCR NEGATIVE NEGATIVE Final    Comment: (NOTE) SARS-CoV-2 target nucleic acids are NOT DETECTED.  The SARS-CoV-2 RNA is generally detectable in upper respiratory specimens during the acute phase of infection. The lowest concentration of SARS-CoV-2 viral copies this assay can detect is 138 copies/mL. A negative result does not preclude SARS-Cov-2 infection and should not be used as the sole basis for treatment or other patient management decisions. A negative result may occur with  improper specimen collection/handling, submission of specimen other than nasopharyngeal swab, presence of viral mutation(s) within the areas targeted by this assay, and inadequate number of viral copies(<138 copies/mL). A negative result must be combined with clinical observations, patient history, and epidemiological information. The expected result is Negative.  Fact Sheet for Patients:  BloggerCourse.com  Fact Sheet for Healthcare Providers:  SeriousBroker.it  This test is no t yet approved or cleared by the [place] FDA and  has been authorized for detection and/or diagnosis of SARS-CoV-2 by FDA under an Emergency Use Authorization (EUA). This EUA will remain  in effect (meaning this test can be used) for the duration of the COVID-19 declaration under Section 564(b)(1) of the Act, 21 U.S.C.section 360bbb-3(b)(1), unless the authorization is terminated  or revoked sooner.       Influenza A by PCR NEGATIVE NEGATIVE Final   Influenza B by PCR NEGATIVE NEGATIVE Final    Comment: (NOTE) The Xpert Xpress SARS-CoV-2/FLU/RSV plus assay is intended as an aid in the diagnosis of influenza from Nasopharyngeal swab specimens and should not be used as a sole basis for treatment. Nasal washings and aspirates are unacceptable for Xpert Xpress SARS-CoV-2/FLU/RSV testing.  Fact Sheet for Patients: BloggerCourse.com  Fact Sheet for Healthcare Providers: SeriousBroker.it  This test is not yet approved or cleared by the [place] FDA and has been authorized for detection and/or diagnosis of SARS-CoV-2 by FDA under an Emergency Use Authorization (EUA). This EUA will remain in effect (meaning this test can be used) for the duration of the COVID-19 declaration under Section 564(b)(1) of the Act, 21 U.S.C. section 360bbb-3(b)(1), unless the authorization is terminated or revoked.     Resp Syncytial Virus by PCR NEGATIVE NEGATIVE  Final    Comment: (NOTE) Fact Sheet for Patients: BloggerCourse.com  Fact Sheet for Healthcare Providers: SeriousBroker.it  This test is not yet approved or cleared by the [place] FDA and has been authorized for detection and/or diagnosis of SARS-CoV-2 by FDA under an Emergency Use Authorization (EUA). This EUA will remain in effect (meaning this test can be used) for the duration of the COVID-19 declaration under Section 564(b)(1) of the Act, 21 U.S.C. section  360bbb-3(b)(1), unless the authorization is terminated or revoked.  Performed at [facility], [address], [place], [place] [address]     Labs: CBC: Recent Labs  Lab 05/09/22 1736 05/10/22 0500 05/11/22 0710 05/12/22 0148 05/13/22 0258 05/13/22 0810 05/13/22 0814 05/13/22 0906  WBC 9.4 8.9 7.9 7.4 6.7  --   --  7.0  NEUTROABS 5.8  --   --   --   --   --   --   --   HGB 14.3 13.7 16.0 16.2 15.8 16.0  16.0 15.6 16.2  HCT 43.5 42.3 46.0 48.5 48.7 47.0  47.0 46.0 47.7  MCV 85.5 84.9 82.1 84.3 84.4  --   --  83.4  PLT 225 216 231 248 230  --   --  237   Basic Metabolic Panel: Recent Labs  Lab 05/09/22 1736 05/10/22 0500 05/11/22 0129 05/12/22 0148 05/13/22 0258 05/13/22 0810 05/13/22 0814 05/13/22 0906  NA 139 139 138 135 136 137  137 136  --   K 4.0 3.7 3.6 4.0 4.4 4.6  4.5 4.5  --   CL 110 107 101 100 100  --   --   --   CO2 21* 26 24 25 24   --   --   --   GLUCOSE 112* 116* 118* 107* 104*  --   --   --   BUN 15 15 13 18 15   --   --   --   CREATININE 1.06 1.09 1.25* 1.31* 1.18  --   --  1.13  CALCIUM 9.2 8.7* 9.4 9.4 9.5  --   --   --   MG  --  2.0  --  2.2  --   --   --   --    Liver Function Tests: No results for input(s): "AST", "ALT", "ALKPHOS", "BILITOT", "PROT", "ALBUMIN" in the last 168 hours. CBG: No results for input(s): "GLUCAP" in the last 168 hours.  Discharge time spent: approximately 35 minutes spent on discharge counseling, evaluation of patient on day of discharge, and coordination of discharge planning with nursing, social work, pharmacy and case management  Signed: , MD [facility] 05/13/2022

## 2022-05-13 NOTE — Discharge Instructions (Signed)

## 2022-05-13 NOTE — Care Management (Signed)
  Transition of Care [facility]) Screening Note   Patient Details  Name: Jeffery Benson Date of Birth: [birth year]   Transition of Care [facility]) CM/SW Contact:    Bethena Roys, RN Phone Number: 05/13/2022, 11:12 AM    Transition of Care Department [facility]) has reviewed the patient and no TOC needs have been identified at this time. Case Manager did speak with the patient and he is without a PCP. Patient is agreeable to have Case Manager call the Internal Medicine Clinic for a hospital follow up appointment. Information is on the AVS. No further home needs identified.

## 2022-05-14 ENCOUNTER — Encounter (HOSPITAL_COMMUNITY): Payer: Self-pay | Admitting: Cardiology

## 2022-05-20 ENCOUNTER — Ambulatory Visit: Payer: 59 | Admitting: Student

## 2022-05-23 ENCOUNTER — Other Ambulatory Visit: Payer: Self-pay

## 2022-05-23 ENCOUNTER — Ambulatory Visit (INDEPENDENT_AMBULATORY_CARE_PROVIDER_SITE_OTHER): Payer: 59 | Admitting: Internal Medicine

## 2022-05-23 ENCOUNTER — Encounter: Payer: Self-pay | Admitting: Internal Medicine

## 2022-05-23 VITALS — BP 97/67 | HR 72 | Temp 98.1°F | Ht 76.0 in | Wt >= 6400 oz

## 2022-05-23 DIAGNOSIS — G4730 Sleep apnea, unspecified: Secondary | ICD-10-CM

## 2022-05-23 DIAGNOSIS — Z23 Encounter for immunization: Secondary | ICD-10-CM

## 2022-05-23 DIAGNOSIS — I10 Essential (primary) hypertension: Secondary | ICD-10-CM | POA: Diagnosis not present

## 2022-05-23 DIAGNOSIS — I5020 Unspecified systolic (congestive) heart failure: Secondary | ICD-10-CM | POA: Diagnosis not present

## 2022-05-23 DIAGNOSIS — R7309 Other abnormal glucose: Secondary | ICD-10-CM | POA: Diagnosis not present

## 2022-05-23 DIAGNOSIS — I110 Hypertensive heart disease with heart failure: Secondary | ICD-10-CM

## 2022-05-23 DIAGNOSIS — Z9189 Other specified personal risk factors, not elsewhere classified: Secondary | ICD-10-CM

## 2022-05-23 DIAGNOSIS — Z1211 Encounter for screening for malignant neoplasm of colon: Secondary | ICD-10-CM

## 2022-05-23 NOTE — Progress Notes (Unsigned)
CC: Establish Care/Hospital Follow up  HPI:  [name] is a 47 y.o. male with a past medical history of HTN, HFrEF presenting to the clinic today for hospital follow up 12/29-01/02/23 for acute heart failure exacerbation and establishment of care. Previous PCP was Dr. Shawna Benson last seen over 6 years ago. Please see problem based assessment and plan for additional details.  Past Medical History:  Diagnosis Date   History of chickenpox    Hypertension    Myocardial injury 05/09/2022   Numbness and tingling of foot 10/31/2011   Rupture of right quadriceps tendon 08/14/2016  HFrEF Right quadricep tendon rupture s/p repair Shoulder replacement 2014.  Achilles tendon tear 13 years ago.  Ankle fracture about 20 years ago   Current Outpatient Medications (Endocrine & Metabolic):    empagliflozin (JARDIANCE) 10 MG TABS tablet, Take 1 tablet (10 mg total) by mouth daily.  Current Outpatient Medications (Cardiovascular):    carvedilol (COREG) 3.125 MG tablet, Take 1 tablet (3.125 mg total) by mouth 2 (two) times daily with a meal.   furosemide (LASIX) 40 MG tablet, Take 1 tablet (40 mg total) by mouth daily.   sacubitril-valsartan (ENTRESTO) 24-26 MG, Take 1 tablet by mouth 2 (two) times daily.   spironolactone (ALDACTONE) 25 MG tablet, Take 1/2 tablet (12.5 mg total) by mouth daily.     Current Outpatient Medications (Other):    potassium chloride SA (KLOR-CON M) 20 MEQ tablet, Take 1 tablet (20 mEq total) by mouth daily.  Allergies: NKDA  Family History  Problem Relation Age of Onset   Cancer Mother        bladder & ovarian   Diabetes Mother    Cancer Father        colon   Depression Maternal Aunt    Depression Maternal Grandmother    Alcohol abuse Paternal Grandfather   MI in paternal grandmother  Social History: Lives with wife and 2 daughter: 8 and 34. Clinical cytogeneticist at [employer]. No smoking, glass of wine weekly. No other substance use.   Review of  Systems: ROS negative except for what is noted on the assessment and plan.  Vitals:   05/23/22 1146  BP: 97/67  Pulse: 72  Temp: 98.1 F (36.7 C)  TempSrc: Oral  SpO2: 93%  Weight: (!) 416 lb 14.4 oz (189.1 kg)  Height: 6\' 4"  (1.93 m)     Physical Exam: General: Well appearing, NAD HENT: normocephalic, atraumatic EYES: conjunctiva non-erythematous, no scleral icterus CV: regular rate, normal rhythm, no murmurs, rubs, gallops. Pulmonary: normal work of breathing on RA, lungs clear to auscultation, no rales, wheezes, rhonchi Abdominal: non-distended, soft, non-tender to palpation, normal BS Skin: Warm and dry, no rashes or lesions Neurological: MS: awake, alert and oriented x3, normal speech and fund of knowledge Motor: moves all extremities antigravity Psych: normal affect    Assessment & Plan:   No problem-specific Assessment & Plan notes found for this encounter.   See Encounters Tab for problem based charting.  Patient discussed with Dr. Dollene Cleveland, MD Tillie Rung. [facility] Internal Medicine Residency, PGY-2   Echo here showed EF 20-25%, global hypokinesis, GIIDD, LA with severe dilation. , BNP elevated 400, trops in 100s. Cardiology consulted, diuresed to euvoelmia.    Underwent R/LHC that showed no significant coronary disease, mildly elevated LVEDP.    Discharged on GDMT, has Cardiology follow up Jan 16 Diuretic: furosemide 40 mg PO daily Beta Blocker: carvedilol 3.125 mg BID ACE/ARB/ARNI: Entresto 24/26 mg  BID MRA: spironolactone 25 mg qd SGLT2i: Jardiance 10 mg daily Potassium 20 mEq Weight 203 at admission and 192 today  Concern for OSA: STOP BANG score 6; Echo shows mild pulmonary HTN.   Repeat BMP. Get A1c, lipid panel.

## 2022-05-23 NOTE — Patient Instructions (Signed)
Jeffery Benson, it was a pleasure seeing you today! You endorsed feeling well today. Below are some of the things we talked about this visit. We look forward to seeing you in the follow up appointment!  Today we discussed: You are doing well after your hospitalization. Continue current medicines. We will check lab work today and give you a call Monday with the results.  I am referring you for sleep study and colonoscopy. Please complete these at your earliest convenience.   I have ordered the following labs today:  Lab Orders         BMP8+Anion Gap         Lipid Profile         Hemoglobin A1c         Magnesium       Referrals ordered today:   Referral Orders         Ambulatory referral to Sleep Studies         Ambulatory referral to Gastroenterology      I have ordered the following medication/changed the following medications:   Stop the following medications: There are no discontinued medications.   Start the following medications: No orders of the defined types were placed in this encounter.    Follow-up: 3 month follow up   Please make sure to arrive 15 minutes prior to your next appointment. If you arrive late, you may be asked to reschedule.   We look forward to seeing you next time. Please call our clinic at [phone] if you have any questions or concerns. The best time to call is Monday-Friday from 9am-4pm, but there is someone available 24/7. If after hours or the weekend, call the main hospital number and ask for the Internal Medicine Resident On-Call. If you need medication refills, please notify your pharmacy one week in advance and they will send [place] a request.  Thank you for letting us take part in your care. Wishing you the best!  Thank you, Idamae Schuller, MD

## 2022-05-24 NOTE — Assessment & Plan Note (Signed)
Concern for OSA: STOP BANG score 6; Echo shows mild pulmonary HTN. Referral for sleep study placed.

## 2022-05-24 NOTE — Assessment & Plan Note (Addendum)
Pt has recently diagnosed HFrEF and was started on GDMT by cardiology. Pt seems to be coping with the diagnosis well. He is adherent to the regimen and states all of his symptoms have resolved. Advised him to continue current medications and notify us if he has problem obtaining any of his medications. Counseled extensively on lifestyle modification including med adherence to minimize exacerbations. Will get BMP, Mag this visit. BP was slightly low but MAP>65 and pt completely asymptomatic. Will get lipid panel and A1c to ensure pt CV risk factors are optimized. If pt has DMII, will start GLP1 agonist given BMI. Pt has cardiology follow up on 05/27/22.

## 2022-05-25 LAB — BMP8+ANION GAP
Anion Gap: 14 mmol/L (ref 10.0–18.0)
BUN/Creatinine Ratio: 13 (ref 9–20)
BUN: 14 mg/dL (ref 6–24)
CO2: 22 mmol/L (ref 20–29)
Calcium: 10.2 mg/dL (ref 8.7–10.2)
Chloride: 100 mmol/L (ref 96–106)
Creatinine, Ser: 1.05 mg/dL (ref 0.76–1.27)
Glucose: 98 mg/dL (ref 70–99)
Potassium: 5.1 mmol/L (ref 3.5–5.2)
Sodium: 136 mmol/L (ref 134–144)
eGFR: 87 mL/min/{1.73_m2} (ref >59)

## 2022-05-25 LAB — LIPID PANEL
Chol/HDL Ratio: 2.5 ratio (ref 0.0–5.0)
Cholesterol, Total: 204 mg/dL — ABNORMAL HIGH (ref 100–199)
HDL: 83 mg/dL (ref >39)
LDL Chol Calc (NIH): 104 mg/dL — ABNORMAL HIGH (ref 0–99)
Triglycerides: 95 mg/dL (ref 0–149)
VLDL Cholesterol Cal: 17 mg/dL (ref 5–40)

## 2022-05-25 LAB — MAGNESIUM: Magnesium: 2.2 mg/dL (ref 1.6–2.3)

## 2022-05-25 LAB — HEMOGLOBIN A1C
Est. average glucose Bld gHb Est-mCnc: 140 mg/dL
Hgb A1c MFr Bld: 6.5 % — ABNORMAL HIGH (ref 4.8–5.6)

## 2022-05-26 ENCOUNTER — Telehealth (HOSPITAL_COMMUNITY): Payer: Self-pay

## 2022-05-26 NOTE — Telephone Encounter (Signed)
Called to confirm Heart & Vascular Transitions of Care appointment at 05/27/22 @ 3:00pm.    Left message to confirm appointment

## 2022-05-27 ENCOUNTER — Emergency Department (HOSPITAL_COMMUNITY): Payer: 59

## 2022-05-27 ENCOUNTER — Other Ambulatory Visit: Payer: Self-pay | Admitting: Internal Medicine

## 2022-05-27 ENCOUNTER — Observation Stay (HOSPITAL_COMMUNITY)
Admission: EM | Admit: 2022-05-27 | Discharge: 2022-05-31 | Disposition: A | Discharge status: Home / Self Care | Payer: 59 | Attending: Internal Medicine | Admitting: Internal Medicine

## 2022-05-27 ENCOUNTER — Encounter (HOSPITAL_COMMUNITY): Payer: Self-pay | Admitting: Emergency Medicine

## 2022-05-27 ENCOUNTER — Other Ambulatory Visit: Payer: Self-pay

## 2022-05-27 ENCOUNTER — Ambulatory Visit (HOSPITAL_COMMUNITY)
Admit: 2022-05-27 | Discharge: 2022-05-27 | Disposition: A | Discharge status: Home / Self Care | Payer: 59 | Attending: Adult Health | Admitting: Adult Health

## 2022-05-27 VITALS — BP 140/94 | HR 65 | Wt >= 6400 oz

## 2022-05-27 DIAGNOSIS — I110 Hypertensive heart disease with heart failure: Secondary | ICD-10-CM | POA: Insufficient documentation

## 2022-05-27 DIAGNOSIS — I4729 Other ventricular tachycardia: Secondary | ICD-10-CM

## 2022-05-27 DIAGNOSIS — R4701 Aphasia: Secondary | ICD-10-CM | POA: Insufficient documentation

## 2022-05-27 DIAGNOSIS — E119 Type 2 diabetes mellitus without complications: Secondary | ICD-10-CM | POA: Diagnosis not present

## 2022-05-27 DIAGNOSIS — E66813 Obesity, class 3: Secondary | ICD-10-CM

## 2022-05-27 DIAGNOSIS — I639 Cerebral infarction, unspecified: Principal | ICD-10-CM | POA: Insufficient documentation

## 2022-05-27 DIAGNOSIS — Z79899 Other long term (current) drug therapy: Secondary | ICD-10-CM | POA: Diagnosis not present

## 2022-05-27 DIAGNOSIS — I5020 Unspecified systolic (congestive) heart failure: Secondary | ICD-10-CM

## 2022-05-27 DIAGNOSIS — E1169 Type 2 diabetes mellitus with other specified complication: Secondary | ICD-10-CM

## 2022-05-27 LAB — TROPONIN I (HIGH SENSITIVITY)
Troponin I (High Sensitivity): 32 ng/L — ABNORMAL HIGH
Troponin I (High Sensitivity): 34 ng/L — ABNORMAL HIGH

## 2022-05-27 LAB — COMPREHENSIVE METABOLIC PANEL WITH GFR
ALT: 37 U/L (ref 0–44)
AST: 32 U/L (ref 15–41)
Albumin: 4.2 g/dL (ref 3.5–5.0)
Alkaline Phosphatase: 52 U/L (ref 38–126)
Anion gap: 11 (ref 5–15)
BUN: 15 mg/dL (ref 6–20)
CO2: 23 mmol/L (ref 22–32)
Calcium: 9.4 mg/dL (ref 8.9–10.3)
Chloride: 98 mmol/L (ref 98–111)
Creatinine, Ser: 1.03 mg/dL (ref 0.61–1.24)
GFR, Estimated: >60 mL/min
Glucose, Bld: 91 mg/dL (ref 70–99)
Potassium: 4 mmol/L (ref 3.5–5.1)
Sodium: 132 mmol/L — ABNORMAL LOW (ref 135–145)
Total Bilirubin: 0.8 mg/dL (ref 0.3–1.2)
Total Protein: 7.2 g/dL (ref 6.5–8.1)

## 2022-05-27 LAB — DIFFERENTIAL
Abs Immature Granulocytes: 0.01 K/uL (ref 0.00–0.07)
Basophils Absolute: 0.1 K/uL (ref 0.0–0.1)
Basophils Relative: 1 %
Eosinophils Absolute: 0.1 K/uL (ref 0.0–0.5)
Eosinophils Relative: 2 %
Immature Granulocytes: 0 %
Lymphocytes Relative: 34 %
Lymphs Abs: 1.7 K/uL (ref 0.7–4.0)
Monocytes Absolute: 0.6 K/uL (ref 0.1–1.0)
Monocytes Relative: 12 %
Neutro Abs: 2.5 K/uL (ref 1.7–7.7)
Neutrophils Relative %: 51 %

## 2022-05-27 LAB — I-STAT CHEM 8, ED
BUN: 18 mg/dL (ref 6–20)
Calcium, Ion: 1.15 mmol/L (ref 1.15–1.40)
Chloride: 101 mmol/L (ref 98–111)
Creatinine, Ser: 1 mg/dL (ref 0.61–1.24)
Glucose, Bld: 93 mg/dL (ref 70–99)
HCT: 50 % (ref 39.0–52.0)
Hemoglobin: 17 g/dL (ref 13.0–17.0)
Potassium: 4 mmol/L (ref 3.5–5.1)
Sodium: 136 mmol/L (ref 135–145)
TCO2: 26 mmol/L (ref 22–32)

## 2022-05-27 LAB — CBC
HCT: 50.3 % (ref 39.0–52.0)
Hemoglobin: 16.4 g/dL (ref 13.0–17.0)
MCH: 27.6 pg (ref 26.0–34.0)
MCHC: 32.6 g/dL (ref 30.0–36.0)
MCV: 84.7 fL (ref 80.0–100.0)
Platelets: 181 K/uL (ref 150–400)
RBC: 5.94 MIL/uL — ABNORMAL HIGH (ref 4.22–5.81)
RDW: 13.2 % (ref 11.5–15.5)
WBC: 4.9 K/uL (ref 4.0–10.5)
nRBC: 0 % (ref 0.0–0.2)

## 2022-05-27 LAB — PROTIME-INR
INR: 1.1 (ref 0.8–1.2)
Prothrombin Time: 13.8 s (ref 11.4–15.2)

## 2022-05-27 LAB — APTT: aPTT: 31 seconds (ref 24–36)

## 2022-05-27 LAB — ETHANOL: Alcohol, Ethyl (B): <10 mg/dL

## 2022-05-27 MED ORDER — METFORMIN HCL ER 500 MG PO TB24: ORAL_TABLET | ORAL | 0 refills | Status: DC

## 2022-05-27 MED ORDER — TRULICITY 0.75 MG/0.5ML ~~LOC~~ SOAJ: 0.7500 mg | SUBCUTANEOUS | 0 refills | Status: DC

## 2022-05-27 MED ORDER — SODIUM CHLORIDE 0.9% FLUSH
3.0000 mL | Freq: Once | INTRAVENOUS | Status: AC
  Administered 2022-05-27: 3 mL via INTRAVENOUS

## 2022-05-27 NOTE — ED Provider Notes (Signed)
[facility] EMERGENCY DEPARTMENT Provider Note   CSN: [id number] Arrival date & time: 05/27/22  1526     History  Chief Complaint  Patient presents with   Dizziness    Jeffery Benson is a 47 y.o. male who presents emergency department with a chief complaint of neurologic change.  Patient was recently admitted in late December and discharged early January with new onset NSTEMI, acute CHF exacerbation, myocardial injury with runs of nonsustained ventricular tachycardia.  He is now taking carvedilol, Entresto, furosemide, Jardiance, potassium and spironolactone.  Patient states that he was at work today and his boss came into his cubicle.  He turned and suddenly became very dizzy.  He stood up to try and shake hands and speak with managers but could not get any words to come out of his mouth.  He states that this lasted approximately 3 to 5 minutes.  He sat down was able to swallow some water.  He noticed numbness and tingling on the right side of his body which has been present since then and also states that his balance and gait are abnormal now.  The speech symptoms have resolved but he continues to have the symptoms of sensory deficit abnormal gait and balance.  This is all brand-new to him.  He denies vertigo symptoms, headache, neck pain.  He does state that earlier he was dropping things out of both hands that seems to have resolved.  The history is provided by the patient.  Dizziness      Home Medications Prior to Admission medications   Medication Sig Start Date End Date Taking? Authorizing Provider  carvedilol (COREG) 3.125 MG tablet Take 1 tablet (3.125 mg total) by mouth 2 (two) times daily with a meal. 05/13/22   Danford, Suann Larry, MD  empagliflozin (JARDIANCE) 10 MG TABS tablet Take 1 tablet (10 mg total) by mouth daily. 05/14/22   Danford, Suann Larry, MD  furosemide (LASIX) 40 MG tablet Take 1 tablet (40 mg total) by mouth daily. 05/14/22   Danford,  Suann Larry, MD  potassium chloride SA (KLOR-CON M) 20 MEQ tablet Take 1 tablet (20 mEq total) by mouth daily. 05/14/22   Danford, Suann Larry, MD  sacubitril-valsartan (ENTRESTO) 24-26 MG Take 1 tablet by mouth 2 (two) times daily. 05/13/22   Danford, Suann Larry, MD  spironolactone (ALDACTONE) 25 MG tablet Take 1/2 tablet (12.5 mg total) by mouth daily. 05/14/22   Danford, Suann Larry, MD      Allergies    Patient has no known allergies.    Review of Systems   Review of Systems  Neurological:  Positive for dizziness.    Physical Exam Updated Vital Signs BP 133/83   Pulse 62   Temp 98.1 F (36.7 C) (Oral)   Resp 16   Ht 6\' 4"  (1.93 m)   Wt (!) 187 kg   SpO2 98%   BMI 50.18 kg/m  Physical Exam Vitals and nursing note reviewed.  Constitutional:      General: He is not in acute distress.    Appearance: He is well-developed. He is not diaphoretic.  HENT:     Head: Normocephalic and atraumatic.  Eyes:     General: No scleral icterus.    Conjunctiva/sclera: Conjunctivae normal.  Cardiovascular:     Rate and Rhythm: Normal rate and regular rhythm.     Heart sounds: Normal heart sounds.  Pulmonary:     Effort: Pulmonary effort is normal. No respiratory distress.  Breath sounds: Normal breath sounds.  Abdominal:     Palpations: Abdomen is soft.     Tenderness: There is no abdominal tenderness.  Musculoskeletal:     Cervical back: Normal range of motion and neck supple.  Skin:    General: Skin is warm and dry.  Neurological:     Mental Status: He is alert and oriented to person, place, and time.     GCS: GCS eye subscore is 4. GCS verbal subscore is 5. GCS motor subscore is 6.     Cranial Nerves: Cranial nerves 2-12 are intact.     Sensory: Sensory deficit (R upper and lower extremity) present.     Coordination: Finger-Nose-Finger Test normal.  Psychiatric:        Behavior: Behavior normal.    ED Results / Procedures / Treatments   Labs (all labs ordered are  listed, but only abnormal results are displayed) Labs Reviewed  CBC - Abnormal; Notable for the following components:      Result Value   RBC 5.94 (*)    All other components within normal limits  COMPREHENSIVE METABOLIC PANEL - Abnormal; Notable for the following components:   Sodium 132 (*)    All other components within normal limits  TROPONIN I (HIGH SENSITIVITY) - Abnormal; Notable for the following components:   Troponin I (High Sensitivity) 32 (*)    All other components within normal limits  PROTIME-INR  APTT  DIFFERENTIAL  ETHANOL  I-STAT CHEM 8, ED  CBG MONITORING, ED  TROPONIN I (HIGH SENSITIVITY)    EKG None  Radiology CT HEAD WO CONTRAST  Result Date: 05/27/2022 CLINICAL DATA:  Neuro deficit, acute stroke suspected. Dizziness at work. Could not speak about 3-4 months. EXAM: CT HEAD WITHOUT CONTRAST TECHNIQUE: Contiguous axial images were obtained from the base of the skull through the vertex without intravenous contrast. RADIATION DOSE REDUCTION: This exam was performed according to the departmental dose-optimization program which includes automated exposure control, adjustment of the mA and/or kV according to patient size and/or use of iterative reconstruction technique. COMPARISON:  None Available. FINDINGS: Brain: No evidence of acute infarction, hemorrhage, hydrocephalus, extra-axial collection or mass lesion/mass effect. Vascular: No hyperdense vessel or unexpected calcification. Skull: Normal. Negative for fracture or focal lesion. Sinuses/Orbits: No acute finding. Other: None. IMPRESSION: No acute intracranial pathology. Electronically Signed   By: Keane Police D.O.   On: 05/27/2022 16:48    Procedures Procedures    Medications Ordered in ED Medications  sodium chloride flush (NS) 0.9 % injection 3 mL (3 mLs Intravenous Given 05/27/22 2048)    ED Course/ Medical Decision Making/ A&P Clinical Course as of 05/27/22 2133  Tue May 27, 2022  2043 This is a  13-year male with a history of obesity, hypertension, hyperlipidemia, smoking, presenting to ED with acute onset of dizziness around 10 AM today, reporting vertigo symptoms, lightheadedness, balance problems, paresthesias in the right arm and leg, and also clumsy hand.  The symptoms have been intermittent throughout the day but now it is predominantly a sensation of paresthesia in the right arm and leg.  On exam he does have this reported paresthesias.  No difficulty with finger-nose.  He does have ambulatory dysfunction and inability to walk steadily, falling against the wall on the right side.  No pronator drift.  He does not meet LVO criteria, no visual deficits, aphasia (although he reported some transient aphasia earlier this morning), or hemineglect.  There is still concern for possible CVA.  CT  head shows no acute infarct.  But he will need further imaging.  Case was discussed between PA provider and neurology we have ordered MRI of the brain. [MT]    Clinical Course User Index [MT] Terald Sleeper, MD                             Medical Decision Making 47 year old male with new onset neurologic deficits.  I have high concern for stroke versus TIA.  He does have persistent abnormal neurologic deficits.  I have reviewed the patient's labs.  Troponin is mildly elevated likely due to his new onset heart failure.  CT head is negative.  He currently has an MRI brain and without contrast in process.  I discussed the case with Dr. Derry Lory who recommends the MRI.  He asks for a call back when it has resulted.  Signout given to PA [place] at shift change.  Amount and/or Complexity of Data Reviewed Radiology: ordered.           Final Clinical Impression(s) / ED Diagnoses Final diagnoses:  None    Rx / DC Orders ED Discharge Orders          Ordered    MR BRAIN WO CONTRAST        05/27/22 2038              Arthor Captain, PA-C 05/27/22 2334    Terald Sleeper,  MD 05/28/22 1515

## 2022-05-27 NOTE — ED Provider Triage Note (Signed)
Emergency Medicine Provider Triage Evaluation Note  Jeffery Benson , a 47 y.o. male  was evaluated in triage.  Patient presenting today with strokelike symptoms.  He reports that around 1030 this morning he started to feel dizzy, experienced blurred vision and difficulty finding his words.  A few seconds later he noted he was also having trouble picking things up and holding things in his right hand.  No history of CVA.  Reports this lasted around 3 to 4 minutes.  He went to his cardiologist a couple hours ago and they sent him to the emergency department due to his symptoms and continuing to "feel clumsy."  Reports mildly blurred vision however no other complaints. Review of Systems  Positive:  Negative:   Physical Exam  BP 124/87 (BP Location: Right Wrist)   Pulse 72   Temp 98.3 F (36.8 C)   Resp 18   Ht 6\' 4"  (1.93 m)   Wt (!) 187 kg   SpO2 95%   BMI 50.18 kg/m  Gen:   Awake, no distress   Resp:  Normal effort  MSK:   Moves extremities without difficulty  Other:  Cranial nerves II through XII grossly intact.  No problem with finger-nose testing.  Normal strength in bilateral upper and lower extremities.  No noted aphasia or facial droop.  No pronator drift.  Medical Decision Making  Medically screening exam initiated at 3:56 PM.  Appropriate orders placed.  Sherin Quarry was informed that the remainder of the evaluation will be completed by another provider, this initial triage assessment does not replace that evaluation, and the importance of remaining in the ED until their evaluation is complete.    Patient not showing signs of LVO and at this time he is out of the window for tPA.  Will do stroke workup, no code stroke activated.  Spoke about this with Dr. Brandon Melnick, [place], PA-C 05/27/22 [phone]

## 2022-05-27 NOTE — ED Triage Notes (Signed)
Pt states while he was at work he stood up, got dizzy and couldn't speak for about 3-4 mins. Pt states he just did not feel right. Denies any pain. No deficits noted at this time.

## 2022-05-27 NOTE — Progress Notes (Signed)
Internal Medicine Clinic Attending  Case discussed with Dr. Khan  At the time of the visit.  We reviewed the resident's history and exam and pertinent patient test results.  I agree with the assessment, diagnosis, and plan of care documented in the resident's note.  

## 2022-05-27 NOTE — Progress Notes (Signed)
Tried to initiate GLP-1 agonist given DMII, obesity class III and HF but insurance requiring step therapy with metformin. Will start metformin and re-evaluate in one month and start GLP-1 agonist at that time.   Idamae Schuller, MD Tillie Rung. [facility] Internal Medicine Residency, PGY-2

## 2022-05-27 NOTE — Progress Notes (Signed)
  He presented for [facility] appointment for HrEF. History of EF 20-25% . No previous history of CVA/TIA.   EKG today - SR 76 bpm   He was at work today and low lost the ability to speak  around 10:30   ~4-5 minutes. He then had trouble picking up items with his right hand.   He drove to his appointment and keeps saying he feels clumsy and feels off.   Dr Daniel Nones evaluated and recommended further assessment in the ED. Rapid response called and transported to ED.   Nataki Mccrumb NP-C  3:15 PM

## 2022-05-28 ENCOUNTER — Other Ambulatory Visit: Payer: Self-pay

## 2022-05-28 ENCOUNTER — Emergency Department (HOSPITAL_COMMUNITY): Payer: 59

## 2022-05-28 ENCOUNTER — Observation Stay (HOSPITAL_BASED_OUTPATIENT_CLINIC_OR_DEPARTMENT_OTHER): Payer: 59

## 2022-05-28 DIAGNOSIS — E119 Type 2 diabetes mellitus without complications: Secondary | ICD-10-CM

## 2022-05-28 DIAGNOSIS — I6389 Other cerebral infarction: Secondary | ICD-10-CM | POA: Diagnosis not present

## 2022-05-28 DIAGNOSIS — I639 Cerebral infarction, unspecified: Secondary | ICD-10-CM

## 2022-05-28 DIAGNOSIS — I5020 Unspecified systolic (congestive) heart failure: Secondary | ICD-10-CM | POA: Diagnosis not present

## 2022-05-28 DIAGNOSIS — I63512 Cerebral infarction due to unspecified occlusion or stenosis of left middle cerebral artery: Secondary | ICD-10-CM

## 2022-05-28 DIAGNOSIS — Z794 Long term (current) use of insulin: Secondary | ICD-10-CM

## 2022-05-28 DIAGNOSIS — I63412 Cerebral infarction due to embolism of left middle cerebral artery: Secondary | ICD-10-CM

## 2022-05-28 DIAGNOSIS — E1159 Type 2 diabetes mellitus with other circulatory complications: Secondary | ICD-10-CM

## 2022-05-28 DIAGNOSIS — Z7984 Long term (current) use of oral hypoglycemic drugs: Secondary | ICD-10-CM

## 2022-05-28 LAB — CBC
HCT: 48.6 % (ref 39.0–52.0)
Hemoglobin: 16.2 g/dL (ref 13.0–17.0)
MCH: 28 pg (ref 26.0–34.0)
MCHC: 33.3 g/dL (ref 30.0–36.0)
MCV: 84.1 fL (ref 80.0–100.0)
Platelets: 166 10*3/uL (ref 150–400)
RBC: 5.78 MIL/uL (ref 4.22–5.81)
RDW: 13.2 % (ref 11.5–15.5)
WBC: 5 10*3/uL (ref 4.0–10.5)
nRBC: 0 % (ref 0.0–0.2)

## 2022-05-28 LAB — BASIC METABOLIC PANEL
Anion gap: 9 (ref 5–15)
BUN: 13 mg/dL (ref 6–20)
CO2: 27 mmol/L (ref 22–32)
Calcium: 9.2 mg/dL (ref 8.9–10.3)
Chloride: 99 mmol/L (ref 98–111)
Creatinine, Ser: 1.06 mg/dL (ref 0.61–1.24)
GFR, Estimated: >60 mL/min (ref >60)
Glucose, Bld: 93 mg/dL (ref 70–99)
Potassium: 4 mmol/L (ref 3.5–5.1)
Sodium: 135 mmol/L (ref 135–145)

## 2022-05-28 LAB — TROPONIN I (HIGH SENSITIVITY)
Troponin I (High Sensitivity): 31 ng/L — ABNORMAL HIGH (ref <18)
Troponin I (High Sensitivity): 27 ng/L — ABNORMAL HIGH (ref <18)

## 2022-05-28 LAB — CBG MONITORING, ED
Glucose-Capillary: 105 mg/dL — ABNORMAL HIGH (ref 70–99)
Glucose-Capillary: 102 mg/dL — ABNORMAL HIGH (ref 70–99)
Glucose-Capillary: 116 mg/dL — ABNORMAL HIGH (ref 70–99)

## 2022-05-28 MED ORDER — INSULIN ASPART 100 UNIT/ML IJ SOLN: 0.0000 [IU] | Freq: Every day | INTRAMUSCULAR | Status: DC

## 2022-05-28 MED ORDER — CLOPIDOGREL BISULFATE 75 MG PO TABS
75.0000 mg | ORAL_TABLET | Freq: Every day | ORAL | Status: DC
  Administered 2022-05-28 – 2022-05-30 (×3): 75 mg via ORAL
  Filled 2022-05-28 (×3): qty 1

## 2022-05-28 MED ORDER — STROKE: EARLY STAGES OF RECOVERY BOOK
Freq: Once | Status: AC
  Filled 2022-05-28: qty 1

## 2022-05-28 MED ORDER — ENOXAPARIN SODIUM 100 MG/ML IJ SOSY
90.0000 mg | PREFILLED_SYRINGE | Freq: Every day | INTRAMUSCULAR | Status: DC
  Administered 2022-05-28 – 2022-05-30 (×3): 90 mg via SUBCUTANEOUS
  Filled 2022-05-28: qty 1
  Filled 2022-05-28 (×3): qty 0.9

## 2022-05-28 MED ORDER — ASPIRIN 81 MG PO TBEC
81.0000 mg | DELAYED_RELEASE_TABLET | Freq: Every day | ORAL | Status: DC
  Administered 2022-05-28 – 2022-05-30 (×3): 81 mg via ORAL
  Filled 2022-05-28 (×3): qty 1

## 2022-05-28 MED ORDER — INSULIN ASPART 100 UNIT/ML IJ SOLN
0.0000 [IU] | Freq: Three times a day (TID) | INTRAMUSCULAR | Status: DC
  Administered 2022-05-31: 1 [IU] via SUBCUTANEOUS

## 2022-05-28 MED ORDER — EMPAGLIFLOZIN 10 MG PO TABS
10.0000 mg | ORAL_TABLET | Freq: Every day | ORAL | Status: DC
  Administered 2022-05-28 – 2022-05-31 (×4): 10 mg via ORAL
  Filled 2022-05-28 (×4): qty 1

## 2022-05-28 MED ORDER — ATORVASTATIN CALCIUM 40 MG PO TABS
40.0000 mg | ORAL_TABLET | Freq: Every day | ORAL | Status: DC
  Administered 2022-05-28 – 2022-05-31 (×4): 40 mg via ORAL
  Filled 2022-05-28 (×4): qty 1

## 2022-05-28 MED ORDER — IOHEXOL 350 MG/ML SOLN
75.0000 mL | Freq: Once | INTRAVENOUS | Status: AC | PRN
  Administered 2022-05-28: 75 mL via INTRAVENOUS

## 2022-05-28 NOTE — ED Notes (Signed)
Call Marya Landry @[phone]  for transportation of Sleep machine (NOX T3) and to trouble shoot machine issues.

## 2022-05-28 NOTE — Progress Notes (Signed)
HD#0 Subjective:   Summary: Jeffery Benson is a 47 year- old person with PMH of HFrEF EF 20-25%, HTN, and obesity who presented with sudden onset dizziness, difficulty speaking and right arm numbness.   Overnight Events: NAEO.  Reports he was at work yesterday and noticed he started feeling dizzy. He stood up to greet someone at work who was visiting from out of town and couldn't speak along with the dizziness. Boss sat him down, eventually speech came back and dizziness went away. Kept working, got clumsy and kept dropping things. Had numbness in right hand, which is better this morning. Walking was off, moreso due to right side he felt. States he was told about stroke and understands why his symptoms happened as they did. Patient reports his speech and numbness have improved greatly since yesterday.   Objective:  Vital signs in last 24 hours: Vitals:   05/27/22 2230 05/28/22 0100 05/28/22 0200 05/28/22 0521  BP: 108/69 110/68 (!) 117/58 (!) 154/100  Pulse: 68 72 69 66  Resp: 16 16 16 14   Temp:   98 F (36.7 C) 98.1 F (36.7 C)  TempSrc:   Oral Oral  SpO2: 96% 98% 96% 96%  Weight:      Height:       Supplemental O2: Room Air SpO2: 96 %   Physical Exam:  Constitutional: obese male, sitting in hospital bed, in no acute distress HENT: normocephalic atraumatic, mucous membranes moist, PERRL Eyes: conjunctiva non-erythematous Neck: supple Cardiovascular: distant heart sounds, regular rate and rhythm, no lower extremity edema Pulmonary/Chest: normal work of breathing on room air, lungs clear to auscultation anteriorly MSK: normal bulk and tone Neurological: alert & oriented x 3, cranial nerves intact, non-dysarthric, 5/5 strength in bilateral upper and lower extremities, sensation normal, normal gait Skin: warm and dry Psych: Normal mood and affect  Filed Weights   05/27/22 1539  Weight: (!) 187 kg     Intake/Output Summary (Last 24 hours) at 05/28/2022 0800 Last data  filed at 05/27/2022 2048 Gross per 24 hour  Intake 3 ml  Output --  Net 3 ml   Net IO Since Admission: 3 mL [05/28/22 0800]  Pertinent Labs:    Latest Ref Rng & Units 05/28/2022    5:25 AM 05/27/2022    4:36 PM 05/27/2022    3:45 PM  CBC  WBC 4.0 - 10.5 K/uL 5.0   4.9   Hemoglobin 13.0 - 17.0 g/dL 16.2  17.0  16.4   Hematocrit 39.0 - 52.0 % 48.6  50.0  50.3   Platelets 150 - 400 K/uL 166   181        Latest Ref Rng & Units 05/28/2022    5:25 AM 05/27/2022    4:36 PM 05/27/2022    3:45 PM  CMP  Glucose 70 - 99 mg/dL 93  93  91   BUN 6 - 20 mg/dL 13  18  15    Creatinine 0.61 - 1.24 mg/dL 1.06  1.00  1.03   Sodium 135 - 145 mmol/L 135  136  132   Potassium 3.5 - 5.1 mmol/L 4.0  4.0  4.0   Chloride 98 - 111 mmol/L 99  101  98   CO2 22 - 32 mmol/L 27   23   Calcium 8.9 - 10.3 mg/dL 9.2   9.4   Total Protein 6.5 - 8.1 g/dL   7.2   Total Bilirubin 0.3 - 1.2 mg/dL   0.8   Alkaline Phos  38 - 126 U/L   52   AST 15 - 41 U/L   32   ALT 0 - 44 U/L   37     Imaging: CT ANGIO HEAD NECK W WO CM  Result Date: 05/28/2022 CLINICAL DATA:  Dizziness, inability to speak for several minutes; acute infarcts on 05/27/2022 MRI EXAM: CT ANGIOGRAPHY HEAD AND NECK TECHNIQUE: Multidetector CT imaging of the head and neck was performed using the standard protocol during bolus administration of intravenous contrast. Multiplanar CT image reconstructions and MIPs were obtained to evaluate the vascular anatomy. Carotid stenosis measurements (when applicable) are obtained utilizing NASCET criteria, using the distal internal carotid diameter as the denominator. RADIATION DOSE REDUCTION: This exam was performed according to the departmental dose-optimization program which includes automated exposure control, adjustment of the mA and/or kV according to patient size and/or use of iterative reconstruction technique. CONTRAST:  50mL OMNIPAQUE IOHEXOL 350 MG/ML SOLN COMPARISON:  CT head 05/27/2022, no prior CTA FINDINGS:  CT HEAD FINDINGS For noncontrast findings, please see 05/27/2022 CT head. CTA NECK FINDINGS Aortic arch: Two-vessel arch with a common origin of the brachiocephalic and left common carotid arteries. Imaged portion shows no evidence of aneurysm or dissection. No significant stenosis of the major arch vessel origins. Right carotid system: No evidence of stenosis, dissection, or occlusion. Left carotid system: No evidence of stenosis, dissection, or occlusion. Vertebral arteries: No evidence of stenosis, dissection, or occlusion. Skeleton: No acute osseous abnormality. Other neck: Negative. Upper chest: Negative. Review of the MIP images confirms the above findings CTA HEAD FINDINGS Anterior circulation: Both internal carotid arteries are patent to the termini, without significant stenosis. A1 segments patent. Normal anterior communicating artery. Anterior cerebral arteries are patent to their distal aspects. No M1 stenosis or occlusion. MCA branches perfused and symmetric. Posterior circulation: Vertebral arteries patent to the vertebrobasilar junction without stenosis. Posterior inferior cerebellar arteries patent proximally. Basilar patent to its distal aspect. Superior cerebellar arteries patent proximally. Patent P1 segments. PCAs perfused to their distal aspects without stenosis. The bilateral posterior communicating arteries are not visualized. Venous sinuses: As permitted by contrast timing, patent. Anatomic variants: None significant. Review of the MIP images confirms the above findings IMPRESSION: 1. No intracranial large vessel occlusion or significant stenosis. 2. No hemodynamically significant stenosis in the neck. Electronically Signed   By: Wiliam Ke M.D.   On: 05/28/2022 03:11   MR BRAIN WO CONTRAST  Result Date: 05/28/2022 CLINICAL DATA:  Stroke-like symptoms, dizziness, blurry vision, word-finding difficulty EXAM: MRI HEAD WITHOUT CONTRAST TECHNIQUE: Multiplanar, multiecho pulse sequences of  the brain and surrounding structures were obtained without intravenous contrast. COMPARISON:  No prior MRI available, 05/27/2022 CT head FINDINGS: Brain: Small areas of restricted diffusion with ADC correlate in the left posterior insular cortex, subcortical Astrid Vides matter, and posterior frontal lobe Zaina Jenkin matter (series 2, images 29-33), and left parietal lobe cortex and subcortical Danney Bungert matter (series 2, images 30-37). No acute hemorrhage, mass, mass effect, or midline shift. No hydrocephalus or extra-axial collection. No hemosiderin deposition to suggest remote hemorrhage. Vascular: Normal arterial flow voids. Skull and upper cervical spine: Normal marrow signal. Sinuses/Orbits: Clear paranasal sinuses. No acute finding in the orbits. Other: The mastoids are well aerated. IMPRESSION: Small acute infarcts in the left posterior insular cortex, subcortical Virna Livengood matter, and posterior frontal lobe Fatima Fedie matter, as well as the left parietal lobe cortex and subcortical Shalyn Koral matter. These results were called by telephone at the time of interpretation on 05/28/2022 at 12:28 am to  provider [facility], who verbally acknowledged these results. Electronically Signed   By: Merilyn Baba M.D.   On: 05/28/2022 00:28   CT HEAD WO CONTRAST  Result Date: 05/27/2022 CLINICAL DATA:  Neuro deficit, acute stroke suspected. Dizziness at work. Could not speak about 3-4 months. EXAM: CT HEAD WITHOUT CONTRAST TECHNIQUE: Contiguous axial images were obtained from the base of the skull through the vertex without intravenous contrast. RADIATION DOSE REDUCTION: This exam was performed according to the departmental dose-optimization program which includes automated exposure control, adjustment of the mA and/or kV according to patient size and/or use of iterative reconstruction technique. COMPARISON:  None Available. FINDINGS: Brain: No evidence of acute infarction, hemorrhage, hydrocephalus, extra-axial collection or mass lesion/mass effect.  Vascular: No hyperdense vessel or unexpected calcification. Skull: Normal. Negative for fracture or focal lesion. Sinuses/Orbits: No acute finding. Other: None. IMPRESSION: No acute intracranial pathology. Electronically Signed   By: Keane Police D.O.   On: 05/27/2022 16:48    Assessment/Plan:   Principal Problem:   CVA (cerebral vascular accident) [facility])   Patient Summary: [name] is a 47 year- old person with PMH of HFrEF EF 20-25%, HTN, and obesity who presented with sudden onset dizziness, difficulty speaking and right arm numbness admitted for acute embolic left MCA stroke.Marland Kitchen  Acute embolic stroke of left MCA Patient presented with sudden onset expressive aphasia, dizziness, and right arm numbness/ dexterity deficits. Aphasia and dizziness have resolved, and his right arm numbness is almost resolved today. MRI showed several left MCA infarcts concerning for embolic stroke. CTA neck with no stenosis. He does not have prior history of atrial fibrillation. Strength and sensation intact on exam this morning. For secondary prevention, Lipid panel 1/12 with LDL of 104 and he was started on atorvastatin 40 mg daily. His goal will now be LDL <70. Hgb A1c at 6.5 1/12. He is taking jardiance. Bedside swallow completed and patient passed. -aspirin 81 mg daily along with Plavix 70 mg daily for 21 days, followed by aspirin 81 mg daily alone.  -permissive HTN first 24 h < 220/110 , holding GDMT -frequent neuro checks -TTE with bubble study pending -continue tele monitoring, cardiac heart monitoring at discharge -continue atorvastatin 40 mg and jardiance 10 mg -PT/OT/ SLP -Na restricted diet -lovenox for DVT ppx   HFrEF Patient presented with chest discomfort 12/12 and Echo 12/23 showed new EF 20-25%. R/LHC showed no CAD. He was to establish care with heartcare 1/16, but with new symptoms was sent to ED. Does not appear volume overloaded on exam today. Saturating well on RA. -holding entresto,  spironolactone, carvedilol, furosemide in setting of permissive HTN -restarted jardiance -will need heartcare f/u again at discharge   Troponinemia Troponin mildly elevated at 32 to 34, then down to 31. EKG without ST elevation or t wave inversion.    Diabetes HgbA1c at 6.5%. Medications include jardiance and metformin (started 1/16 and he has not picked up yet). Fasting glucose 93. -SSI   Obesity At risk for OSA BMI 51. STOP BANG elevated at 6 and was referred for sleep study as outpatient.   Best Practice: Diet: low Na VTE: Lovenox Code: Full Code Dispo: Observation with expected length of stay less than 2 midnights. Anticipated Discharge Location: Home Barriers to Discharge:  evaluation with therapies and work-up for embolic source   Dispo: Admit patient to Observation with expected length of stay less than 2 midnights.  Linward Natal MD Internal Medicine Resident PGY-1 Please contact the on call pager after 5  pm and on weekends at [phone].

## 2022-05-28 NOTE — Evaluation (Signed)
Physical Therapy Evaluation Patient Details Name: NAJAE RATHERT MRN: [id number] DOB: Feb 12, 1976 Today's Date: 05/28/2022  History of Present Illness  47 y.o. male presents to [facility] hospital on 05/27/2022 with sudden onset dizziness and aphasia, later developing RUE numbness. MRI demonstrates small acute infarcts in L MCA distribution. PMH includes HFrEF, HTN, obesity.  Clinical Impression  Pt presents to PT with mild deficits in R sided strength and sensation, however he is able to mobilize independently and reports great improvement in symptoms since onset. Pt tolerates multiple dynamic gait challenges without loss of balance, only demonstrating some difficulty with R leg single leg stance. Pt will benefit from frequent mobilization in an effort to further improve R sided strength. PT does not feel this pt requires post-acute PT services at this time. Acute PT signing off.       Recommendations for follow up therapy are one component of a multi-disciplinary discharge planning process, led by the attending physician.  Recommendations may be updated based on patient status, additional functional criteria and insurance authorization.  Follow Up Recommendations No PT follow up      Assistance Recommended at Discharge None  Patient can return home with the following       Equipment Recommendations None recommended by PT  Recommendations for Other Services       Functional Status Assessment Patient has had a recent decline in their functional status and demonstrates the ability to make significant improvements in function in a reasonable and predictable amount of time.     Precautions / Restrictions Precautions Precautions: None Restrictions Weight Bearing Restrictions: No      Mobility  Bed Mobility Overal bed mobility: Independent                  Transfers Overall transfer level: Independent                      Ambulation/Gait Ambulation/Gait assistance:  Independent Gait Distance (Feet): 400 Feet Assistive device: None Gait Pattern/deviations: WFL(Within Functional Limits) Gait velocity: functional Gait velocity interpretation: >2.62 ft/sec, indicative of community ambulatory   General Gait Details: steady step-through gait, tolerates DGI pieces well including changes in gait speed, head turns, changes in stride length, abrupt stops and turns  Financial trader Rankin (Stroke Patients Only) Modified Rankin (Stroke Patients Only) Pre-Morbid Rankin Score: No symptoms Modified Rankin: No significant disability     Balance Overall balance assessment: Needs assistance Sitting-balance support: No upper extremity supported, Feet supported Sitting balance-Leahy Scale: Normal     Standing balance support: No upper extremity supported, During functional activity Standing balance-Leahy Scale: Good   Single Leg Stance - Right Leg: 3 Single Leg Stance - Left Leg: 7     Rhomberg - Eyes Opened: 30 Rhomberg - Eyes Closed: 30                 Pertinent Vitals/Pain Pain Assessment Pain Assessment: No/denies pain    Home Living Family/patient expects to be discharged to:: Private residence Living Arrangements: Spouse/significant other;Children Available Help at Discharge: Family;Available PRN/intermittently Type of Home: House Home Access: Level entry     Alternate Level Stairs-Number of Steps: 14 Home Layout: Two level Home Equipment: None      Prior Function Prior Level of Function : Independent/Modified Independent;Working/employed;Driving             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  Hand Dominance   Dominant Hand: Right    Extremity/Trunk Assessment   Upper Extremity Assessment Upper Extremity Assessment: RUE deficits/detail RUE Sensation: decreased light touch    Lower Extremity Assessment Lower Extremity Assessment: RLE deficits/detail RLE  Deficits / Details: marginal weakness, 4+/5    Cervical / Trunk Assessment Cervical / Trunk Assessment: Other exceptions Cervical / Trunk Exceptions: obesity  Communication   Communication: No difficulties  Cognition Arousal/Alertness: Awake/alert Behavior During Therapy: WFL for tasks assessed/performed Overall Cognitive Status: Within Functional Limits for tasks assessed                                          General Comments General comments (skin integrity, edema, etc.): VSS on RA    Exercises     Assessment/Plan    PT Assessment Patient does not need any further PT services  PT Problem List         PT Treatment Interventions      PT Goals (Current goals can be found in the Care Plan section)       Frequency       Co-evaluation               AM-PAC PT "6 Clicks" Mobility  Outcome Measure Help needed turning from your back to your side while in a flat bed without using bedrails?: None Help needed moving from lying on your back to sitting on the side of a flat bed without using bedrails?: None Help needed moving to and from a bed to a chair (including a wheelchair)?: None Help needed standing up from a chair using your arms (e.g., wheelchair or bedside chair)?: None Help needed to walk in hospital room?: None Help needed climbing 3-5 steps with a railing? : None 6 Click Score: 24    End of Session   Activity Tolerance: Patient tolerated treatment well Patient left: with call bell/phone within reach;in bed Nurse Communication: Mobility status PT Visit Diagnosis: Other symptoms and signs involving the nervous system (W23.762)    Time: 8315-1761 PT Time Calculation (min) (ACUTE ONLY): 14 min   Charges:   PT Evaluation $PT Eval Low Complexity: [place], PT, DPT Acute Rehabilitation Office [phone]   Zenaida Niece 05/28/2022, 9:13 AM

## 2022-05-28 NOTE — ED Notes (Signed)
Lovenox requested from pharmacy.

## 2022-05-28 NOTE — Hospital Course (Addendum)
Acute CVA of left MCA Patient presented after acute onset of difficulty with speech, R arm numbness, and difficulty with his balance. CT head was negative for acute intracranial pathology. MRI brain showed small acute infarcts of the L posterior insular cortex, white matter, and posterior frontal lobe white matter, as well as the L parietal lobe cortex and subcortical white matter. CTA head/neck showed no intracranial large vessel occlusion or significant stenosis, and no hemodynamically significant stenosis in the neck. He was hemodynamically stable. On admission he was allowed permissive hypertension. Lipid profile was checked recently with LDL above goal at 104. HbA1c also checked recently and was 6.5%. TTE and TEE did not show source of cardioembolic stroke. At time of discharge, patient had no residual deficits.  HFrEF, NYHA II Troponinemia HFrEF with LF EV 20-25% diagnosed in 04/2022; R/LHC with no CAD at that time. He is on GDMT however this was held initially on admission to allow for permissive hypertension. Troponins initially elevated with peak of 34, thought to be troponin leak as there were no concerning EKG findings. TTE did not show source of cardioembolic stroke. TEE showed LV EF 20-25% with dilation, global hypokinesis, and no clot; LV with mild hypokinesis; moderately dilated LA with no thrombus; trivial mitral and tricuspid regurgitation; no PFO or ASD. Cardiac MRI did not show evidence of sarcoidosis. At time of discharge he has resumed GDMT.

## 2022-05-28 NOTE — ED Notes (Signed)
Patient assisted to bathroom 

## 2022-05-28 NOTE — H&P (Addendum)
Date: 05/28/2022               Patient Name:  Jeffery Benson MRN: [id number]  DOB: February 21, 1976 Age / Sex: 47 y.o., male   PCP: Idamae Schuller, MD         Medical Service: Internal Medicine Teaching Service         Attending Physician: Dr. Campbell Riches, MD    First Contact: Dr. Linward Natal Pager: [phone]  Second Contact: Dr. Farrel Gordon Pager: [phone]       After Hours (After 5p/  First Contact Pager: [phone]  weekends / holidays): Second Contact Pager: [phone]   Chief Complaint: aphasia, right sided numbness  History of Present Illness:   [name] is a 47 year- old person with PMH of HFrEF EF 20-25%, HTN, and obesity who presented with sudden onset dizziness, difficulty speaking and right arm numbness. He was at work on 1/16 and turned to talk with Art therapist. He felt dizzy when he tried to stand and could not speak. This was around 10AM. He then developed numbness in his right arm and drove to hospital for previously scheduled cardiology appointment with Dr. Daniel Nones at [facility]. He continued to feel clumsy and off balance so was encouraged to go to ED.  He established care with [facility] 1/12 after hospital admission for newly found HFrEF. He states that he has not had further symptoms of chest discomfort at home and has adjusted well to taking new medications.  He denies vision changes, headache, and palpitations. Felt normal prior to symptom onset.  Endorses numbness of right arm and difficulty with fine motor tasks.   ED course: In the ED, initial BP elevated at 133/104 with HR 55. Labs showed troponin 32 to 34 with no ST changes on EKG. MRI head showed small acute infarcts to left MCA distribution and neurology was consulted. IMTS then consulted for admission.  Past Medical History:  Diagnosis Date   History of chickenpox    Hypertension    Myocardial injury 05/09/2022   Numbness and tingling of foot 10/31/2011   Rupture of right quadriceps tendon 08/14/2016     Meds:  Jardiance 10mg  daily Carvidoil 3.125 BID Lasix daily  Spironolactone 12.5 daily  Entresto  KCL 57meq daily   Allergies: Allergies as of 05/27/2022   (No Known Allergies)    Family History:  Grandmother: CAD Unsure of family hisotory   Social History:  Work: Clinical cytogeneticist at [employer] with: Wife and chidren 66 and 10  He is independent in ADLs and iADLs Does not use tobacco products or drink alcohol  Review of Systems: A complete ROS was negative except as per HPI.   Physical Exam: Blood pressure (!) 117/58, pulse 69, temperature 98 F (36.7 C), temperature source Oral, resp. rate 16, height 6\' 4"  (1.93 m), weight (!) 187 kg, SpO2 96 %. Constitutional: well-appearing, sleepy  Cardiovascular: regular rate and rhythm, no m/r/g Pulmonary/Chest: normal work of breathing on room air, lungs clear to auscultation bilaterally Abdominal: soft, non-tender, non-distended MSK: normal bulk and tone Neurological:  Mental Status: Patient is awake, alert, oriented x3 No signs of aphasia or neglect Cranial Nerves: II: Pupils equal, round, and reactive to light.   III,IV, VI: EOMI without ptosis or diploplia.  V: Facial sensation is symmetric to light touch and temperature. VII: Facial movement is symmetric.  VIII: Hearing is intact to voice X: Uvula elevates symmetrically XI: Shoulder shrug is symmetric. XII: Tongue is  midline without atrophy or fasciculations.  Motor: good effort thorughout, at least 5/5 bilateral UE, 5/5 bilateral LE Sensory: decreased sensation in right upper extremity  EKG: personally reviewed my interpretation is sinus rhythm with left axis deviation  MRI: Small acute infarcts in the left posterior insular cortex, subcortical white matter, and posterior frontal lobe white matter, as well as the left parietal lobe cortex and subcortical white matter.  Assessment & Plan by Problem: Principal Problem:   CVA (cerebral vascular  accident) ([place])  Acute embolic stroke of left MCA Patient presenting with sudden onset expressive aphasia, dizziness, and right arm numbness/ dexterity deficits. Aphasia and dizziness have resolved, but he continues to have difficulty with fine movements of right hand and numbness. MRI showed several left MCA infarcts concerning for embolic stroke. On exam there is no lower extremity erythema or swelling, his neuro exam shows no motor deficits but persistent subjective numbness to right arm. He does not have prior history of atrial fibrillation and no signs of DVT on exam. For secondary prevention, Lipid panel 1/12 with LDL of 104 and he was started on atorvastatin 40 mg. His goal will now be LDL <70. Hgb A1c at 6.5 1/12. He is taking jardiance. Bedside swallow completed and patient passed.  -permissive HTN in first 24 hr, holding GDMT -frequent neuro checks -TTE with bubble study -continue tele monitoring, he will likely need cardiac heart monitoring at discharge -continue atorvastatin 40 mg and jardiance 10 mg -PT/OT/ SLP -Na restricted diet -CTA neck with no stenosis -lovenox for DVT ppx  HFrEF Patient presented with chest discomfort 12/12 and Echo 12/23 showed new EF 20-25%. R/LHC showed no CAD. He eas to establish care with heartcare 1/16, but with new symptoms was sent to ED. -holding entresto, spironolactone, carvedilol, furosemide in setting of permissive HTN -restart jardiance -will need heartcare f/u again at discharge  Troponinemia Troponin mildly elevated at 32 to 34. EKG without ST elevation or t wave inversion.   -trend trops to downtrend  Diabetes HgbA1c at 6.5%. Medications include jardiance and metformin (started 1/16 and he has not picked up yet). -SSI  Obesity At risk for OSA BMI 51. STOP BANG elevated at 6 and was referred for sleep study as outpatient.  Best Practice: Diet: low Na VTE: Lovenox Code: Full Code Dispo: Observation with expected length of stay  less than 2 midnights. Anticipated Discharge Location: Home Barriers to Discharge:  evaluation with therapies and work-up for embolic source  Dispo: Admit patient to Observation with expected length of stay less than 2 midnights.  Signed: Christiana Fuchs, DO 05/28/2022, 4:02 AM  Pager: [phone] After 5pm on weekdays and 1pm on weekends: On Call pager: [phone]

## 2022-05-28 NOTE — Progress Notes (Addendum)
STROKE TEAM PROGRESS NOTE   INTERVAL HISTORY His wife is at the bedside.  States that when this started he felt clumsy and his speech was abnormal. His speech has improved, but he does have some fine motor slowing.  Recommend TEE and loop recorder.  TEE may happen outpatient, loop recorder implant prior to discharge.  MRI scan shows a left posterior frontal MCA branch infarct.  CT angiogram shows no large vessel stenosis or occlusion.  Echocardiogram showed diminished ejection fraction of 20 to 25%.  Dilated left atrium.  No definite clot.  Hemoglobin A1c is 6.5.  LDL cholesterol is 104 mg percent.  Vitals:   05/28/22 1115 05/28/22 1200 05/28/22 1215 05/28/22 1222  BP:  132/64    Pulse: 70 70 68   Resp: 15 15 (!) 23   Temp:    97.9 F (36.6 C)  TempSrc:    Oral  SpO2: 98% 93% 95%   Weight:      Height:       CBC:  Recent Labs  Lab 05/27/22 1545 05/27/22 1636 05/28/22 0525  WBC 4.9  --  5.0  NEUTROABS 2.5  --   --   HGB 16.4 17.0 16.2  HCT 50.3 50.0 48.6  MCV 84.7  --  84.1  PLT 181  --  093   Basic Metabolic Panel:  Recent Labs  Lab 05/23/22 1202 05/23/22 1202 05/27/22 1545 05/27/22 1636 05/28/22 0525  NA 136   < > 132* 136 135  K 5.1  --  4.0 4.0 4.0  CL 100  --  98 101 99  CO2 22  --  23  --  27  GLUCOSE 98   < > 91 93 93  BUN 14   < > 15 18 13   CREATININE 1.05  --  1.03 1.00 1.06  CALCIUM 10.2  --  9.4  --  9.2  MG 2.2  --   --   --   --    < > = values in this interval not displayed.   Lipid Panel:  Recent Labs  Lab 05/23/22 1202  CHOL 204*  TRIG 95  HDL 83  CHOLHDL 2.5  LDLCALC 104*   HgbA1c:  Recent Labs  Lab 05/23/22 1202  HGBA1C 6.5*   Urine Drug Screen: No results for input(s): "LABOPIA", "COCAINSCRNUR", "LABBENZ", "AMPHETMU", "THCU", "LABBARB" in the last 168 hours.  Alcohol Level  Recent Labs  Lab 05/27/22 1545  ETH <10    IMAGING past 24 hours CT ANGIO HEAD NECK W WO CM  Result Date: 05/28/2022 CLINICAL DATA:  Dizziness,  inability to speak for several minutes; acute infarcts on 05/27/2022 MRI EXAM: CT ANGIOGRAPHY HEAD AND NECK TECHNIQUE: Multidetector CT imaging of the head and neck was performed using the standard protocol during bolus administration of intravenous contrast. Multiplanar CT image reconstructions and MIPs were obtained to evaluate the vascular anatomy. Carotid stenosis measurements (when applicable) are obtained utilizing NASCET criteria, using the distal internal carotid diameter as the denominator. RADIATION DOSE REDUCTION: This exam was performed according to the departmental dose-optimization program which includes automated exposure control, adjustment of the mA and/or kV according to patient size and/or use of iterative reconstruction technique. CONTRAST:  51mL OMNIPAQUE IOHEXOL 350 MG/ML SOLN COMPARISON:  CT head 05/27/2022, no prior CTA FINDINGS: CT HEAD FINDINGS For noncontrast findings, please see 05/27/2022 CT head. CTA NECK FINDINGS Aortic arch: Two-vessel arch with a common origin of the brachiocephalic and left common carotid arteries. Imaged portion shows  no evidence of aneurysm or dissection. No significant stenosis of the major arch vessel origins. Right carotid system: No evidence of stenosis, dissection, or occlusion. Left carotid system: No evidence of stenosis, dissection, or occlusion. Vertebral arteries: No evidence of stenosis, dissection, or occlusion. Skeleton: No acute osseous abnormality. Other neck: Negative. Upper chest: Negative. Review of the MIP images confirms the above findings CTA HEAD FINDINGS Anterior circulation: Both internal carotid arteries are patent to the termini, without significant stenosis. A1 segments patent. Normal anterior communicating artery. Anterior cerebral arteries are patent to their distal aspects. No M1 stenosis or occlusion. MCA branches perfused and symmetric. Posterior circulation: Vertebral arteries patent to the vertebrobasilar junction without  stenosis. Posterior inferior cerebellar arteries patent proximally. Basilar patent to its distal aspect. Superior cerebellar arteries patent proximally. Patent P1 segments. PCAs perfused to their distal aspects without stenosis. The bilateral posterior communicating arteries are not visualized. Venous sinuses: As permitted by contrast timing, patent. Anatomic variants: None significant. Review of the MIP images confirms the above findings IMPRESSION: 1. No intracranial large vessel occlusion or significant stenosis. 2. No hemodynamically significant stenosis in the neck. Electronically Signed   By: Merilyn Baba M.D.   On: 05/28/2022 03:11   MR BRAIN WO CONTRAST  Result Date: 05/28/2022 CLINICAL DATA:  Stroke-like symptoms, dizziness, blurry vision, word-finding difficulty EXAM: MRI HEAD WITHOUT CONTRAST TECHNIQUE: Multiplanar, multiecho pulse sequences of the brain and surrounding structures were obtained without intravenous contrast. COMPARISON:  No prior MRI available, 05/27/2022 CT head FINDINGS: Brain: Small areas of restricted diffusion with ADC correlate in the left posterior insular cortex, subcortical white matter, and posterior frontal lobe white matter (series 2, images 29-33), and left parietal lobe cortex and subcortical white matter (series 2, images 30-37). No acute hemorrhage, mass, mass effect, or midline shift. No hydrocephalus or extra-axial collection. No hemosiderin deposition to suggest remote hemorrhage. Vascular: Normal arterial flow voids. Skull and upper cervical spine: Normal marrow signal. Sinuses/Orbits: Clear paranasal sinuses. No acute finding in the orbits. Other: The mastoids are well aerated. IMPRESSION: Small acute infarcts in the left posterior insular cortex, subcortical white matter, and posterior frontal lobe white matter, as well as the left parietal lobe cortex and subcortical white matter. These results were called by telephone at the time of interpretation on 05/28/2022  at 12:28 am to provider [facility], who verbally acknowledged these results. Electronically Signed   By: Merilyn Baba M.D.   On: 05/28/2022 00:28   CT HEAD WO CONTRAST  Result Date: 05/27/2022 CLINICAL DATA:  Neuro deficit, acute stroke suspected. Dizziness at work. Could not speak about 3-4 months. EXAM: CT HEAD WITHOUT CONTRAST TECHNIQUE: Contiguous axial images were obtained from the base of the skull through the vertex without intravenous contrast. RADIATION DOSE REDUCTION: This exam was performed according to the departmental dose-optimization program which includes automated exposure control, adjustment of the mA and/or kV according to patient size and/or use of iterative reconstruction technique. COMPARISON:  None Available. FINDINGS: Brain: No evidence of acute infarction, hemorrhage, hydrocephalus, extra-axial collection or mass lesion/mass effect. Vascular: No hyperdense vessel or unexpected calcification. Skull: Normal. Negative for fracture or focal lesion. Sinuses/Orbits: No acute finding. Other: None. IMPRESSION: No acute intracranial pathology. Electronically Signed   By: Keane Police D.O.   On: 05/27/2022 16:48    PHYSICAL EXAM  Physical Exam  Constitutional: Obese middle-aged African-American male not in distress Cardiovascular: Normal rate and regular rhythm.  Respiratory: Effort normal, non-labored breathing  Neuro: Mental Status: Patient is awake, alert,  oriented to person, place, month, year, and situation. Patient is able to give a clear and coherent history. No signs of aphasia or neglect.  No dysarthria Cranial Nerves: II: Visual Fields are full. Pupils are equal, round, and reactive to light.   III,IV, VI: EOMI without ptosis or diploplia.  V: Facial sensation is symmetric to temperature VII: Facial movement is symmetric resting and smiling VIII: Hearing is intact to voice X: Palate elevates symmetrically XI: Shoulder shrug is symmetric. XII: Tongue protrudes midline  without atrophy or fasciculations.  Motor: Tone is normal. Bulk is normal. 5/5 strength was present in all four extremities.  Diminished fine finger movements on the right.  Orbits left or right upper extremity. Sensory: Sensation is symmetric to light touch. .  Cerebellar: FNF and HKS are intact bilaterally    NIHSS O premorbid modified Rankin score 0    ASSESSMENT/PLAN [name] is a 47 y.o. male with history of HFrEF EF 20-25%, HTN, and obesity who presented with sudden onset dizziness, difficulty speaking and right arm numbness.  Plan for outpatient TEE and loop recorder prior to discharge.  Stroke:  Patchy left MCA territory infarcts  Etiology:  likely cardioembolic  Code Stroke CT head No acute abnormality. ASPECTS 10.    CTA head & neck No LVO MRI  Small acute infarcts in the left posterior insular cortex, subcortical white matter, and posterior frontal lobe white matter, as well as the left parietal lobe cortex and subcortical white matter.  2D Echo EF 20-25% LA severe dilation  LDL 104 HgbA1c 6.5 VTE prophylaxis - Lovenox    Diet   Diet 2 gram sodium Room service appropriate? Yes; Fluid consistency: Thin   No antithrombotic prior to admission, now on aspirin 81 mg daily and clopidogrel 75 mg daily.x 3 weeks and then aspirin alone Therapy recommendations:  No follow up  Disposition:  Pending  Hypertension Home meds:  Coreg, entresto, aldactone, lasix  Stable Permissive hypertension (OK if < 220/120) but gradually normalize in 5-7 days Long-term BP goal normotensive  Hyperlipidemia LDL 104, goal < 70 Add atorvastatin 40mg  daily   Continue statin at discharge  Diabetes type II Controlled Home meds:  Metformin HgbA1c 6.5, goal < 7.0 CBGs Recent Labs    05/28/22 0849 05/28/22 1236  GLUCAP 105* 102*    SSI  Other Stroke Risk Factors Substance abuse - UDS:  THC No results found for requested labs within last 1095 days., Cocaine No results found for  requested labs within last 1095 days.. Patient advised to stop using due to stroke risk. Obesity, Body mass index is 50.18 kg/m., BMI >/= 30 associated with increased stroke risk, recommend weight loss, diet and exercise as appropriate  Congestive heart failure- heart cath on 05/13/2022 Normal coronary anatomy Mildly elevated LV filling pressures. PCWP 26/24 with mean 22 mm Hg. LVEDP 32 mm Hg Mild pulmonary HTN PAP 46/12 with mean 27 mm Hg Reduced Cardiac output 5.56 liters/min with index 1.82 Home Meds: entresto, aldactone, jardiance, lasix  Obstructive sleep apnea Sleep Smart Study  Other [facility] day # 0  Patient seen and examined by NP/APP with MD. MD to update note as needed.   Janine Ores, DNP, FNP-BC [facility] Pager: [phone]  STROKE MD NOTE :  I have personally obtained history,examined this patient, reviewed notes, independently viewed imaging studies, participated in medical decision making and plan of care.ROS completed by me personally and pertinent positives fully documented  I have  made any additions or clarifications directly to the above note. Agree with note above.  Patient presented with several days history of speech difficulties and right hand weakness but appears to be improving.  MRI scan shows embolic looking left MCA branch infarct and CT angiogram was unremarkable.  He does have cardiomyopathy with low ejection fraction and this is likely cardioembolic.  Recommend further evaluation with checking TEE to look for intra cardiac clot and loop recorder to look for paroxysmal A-fib.  Aspirin and Plavix for 3 weeks followed by aspirin alone and aggressive risk factor modification.  Add statin for elevated lipids.  Long discussion with patient and his wife at the bedside about his stroke and answered questions. Patient also appears to be at risk for obstructive sleep apnea and may benefit with consideration for possible participation in  the sleep smart stroke prevention study for patients with sleep apnea.  He and wife expressed interest on the be given written information to review and decide.  It was made clear to them that study participation is voluntary and will not interact with excellent care that he is getting anyway.  They are given time to review information and decide. Greater than 50% time during this 50-minute visit was spent in counseling and coordination of care about his embolic stroke and discussion about evaluation, treatment and prevention and answering questions.  Delia Heady, MD Medical Director [facility] Pager: [phone] 05/28/2022 2:32 PM   To contact Stroke Continuity provider, please refer to [email or web address]. After hours, contact General Neurology

## 2022-05-28 NOTE — ED Provider Notes (Signed)
Received at shift change from Margarita Mail, PA-C please see note for full detail  In short patient with medical history including hypertension, acute systolic congestive heart failure EF of 20 to 25% presents to the emergency department with complaints of dizziness.  Patient states around 10:30 AM, states that he went to shake someone's hand and got extremely dizzy described as the room is spinning, he states he felt weak on his right side and felt paresthesias, he states that throughout the day he was dropping things and felt like he was not walking properly.  States he had slight change in vision but this is since resolved.  He currently denies headaches, he denies any weakness but states he still feels paresthesias on the right side feels he cannot walk properly.  He has no history of CVAs, no history of A-fib, history of hypertension no hyper or hyperlipidemia.  Per previous provider follow-up on MRI and consult with neurology and likely patient will need to be admitted for acute stroke versus TIA.   Physical Exam  BP (!) 117/58   Pulse 69   Temp 98 F (36.7 C) (Oral)   Resp 16   Ht 6\' 4"  (1.93 m)   Wt (!) 187 kg   SpO2 96%   BMI 50.18 kg/m   Physical Exam Vitals and nursing note reviewed.  Constitutional:      General: He is not in acute distress.    Appearance: He is not ill-appearing.  HENT:     Head: Normocephalic and atraumatic.     Nose: No congestion.  Eyes:     Conjunctiva/sclera: Conjunctivae normal.  Cardiovascular:     Rate and Rhythm: Normal rate and regular rhythm.     Pulses: Normal pulses.     Heart sounds: No murmur heard.    No friction rub. No gallop.  Pulmonary:     Effort: No respiratory distress.     Breath sounds: No wheezing, rhonchi or rales.  Skin:    General: Skin is warm and dry.  Neurological:     Mental Status: He is alert.     GCS: GCS eye subscore is 4. GCS verbal subscore is 5. GCS motor subscore is 6.     Cranial Nerves: Cranial nerves  2-12 are intact.     Sensory: Sensory deficit present.     Motor: No weakness.     Coordination: Romberg sign negative. Finger-Nose-Finger Test normal.     Comments: Cranial nerves II through XII grossly intact no difficulty with word finding following two-step commands there is no unilateral weakness present.  Patient has subjective paresthesias right upper and right lower extremities.  Was not ambulated as patient had a near fall on last ambulation.   Psychiatric:        Mood and Affect: Mood normal.     Procedures  Procedures  ED Course / MDM   Clinical Course as of 05/28/22 0412  Tue May 27, 2022  2043 This is a 68-year male with a history of obesity, hypertension, hyperlipidemia, smoking, presenting to ED with acute onset of dizziness around 10 AM today, reporting vertigo symptoms, lightheadedness, balance problems, paresthesias in the right arm and leg, and also clumsy hand.  The symptoms have been intermittent throughout the day but now it is predominantly a sensation of paresthesia in the right arm and leg.  On exam he does have this reported paresthesias.  No difficulty with finger-nose.  He does have ambulatory dysfunction and inability to walk steadily,  falling against the wall on the right side.  No pronator drift.  He does not meet LVO criteria, no visual deficits, aphasia (although he reported some transient aphasia earlier this morning), or hemineglect.  There is still concern for possible CVA.  CT head shows no acute infarct.  But he will need further imaging.  Case was discussed between PA provider and neurology we have ordered MRI of the brain. [MT]    Clinical Course User Index [MT] Trifan, Carola Rhine, MD   Medical Decision Making Amount and/or Complexity of Data Reviewed Radiology: ordered.  Risk Decision regarding hospitalization.    Lab Tests:  I Ordered, and personally interpreted labs.  The pertinent results include: CBC is unremarkable, CMP shows sodium 132,  prothrombin time INR unremarkable, ethanol unremarkable, first troponin is 32, second troponin is 34   Imaging Studies ordered:  I ordered imaging studies including CT head, MRI I independently visualized and interpreted imaging which showed CT head negative, MRI reveals small acute infarct of the left posterior cortex, posterior frontal lobe and left parietal lobe. I agree with the radiologist interpretation   Cardiac Monitoring:  The patient was maintained on a cardiac monitor.  I personally viewed and interpreted the cardiac monitored which showed an underlying rhythm of: Without signs of ischemia   Medicines ordered and prescription drug management:  I ordered medication including na I have reviewed the patients home medicines and have made adjustments as needed  Critical Interventions:  N/a   Reevaluation:  MRI confirms acute stroke, patient has mild symptoms at this time, he is outside stroke window, will repaged neurology for further evaluation  Patient was updated on these recommendation from neurology agreed this plan will consult hospitalist team for admission.  Consultations Obtained:  I requested consultation with the spoke with Dr. Briant Sites neurology,  and discussed lab and imaging findings as well as pertinent plan - they recommend: Medical admission, dual antiplatelet therapy, he will place formal consultation note. Internal medicine team Dr. Bobby Rumpf will admit the patient.    Test Considered:  N/A    Rule out I have low suspicion for ACS as history is atypical, , EKG was sinus rhythm without signs of ischemia.  Patient does have a slightly elevated troponin but it has essentially plateaued, he has history of congestive heart failure possible there is some strain here I doubt acute abnormality at this time.   Low suspicion for PE as patient denies pleuritic chest pain, shortness of breath, patient denies leg pain, no pedal edema noted on exam,  patient was PERC negative.      Dispostion and problem list  After consideration of the diagnostic results and the patients response to treatment, I feel that the patent would benefit from admission.  Acute stroke-started on dual antiplatelet therapy, patient will need stroke evaluation, and continued monitoring by neurology.           Marcello Fennel, PA-C 05/28/22 8250    Ripley Fraise, MD 05/28/22 [phone]

## 2022-05-28 NOTE — Progress Notes (Signed)
Echocardiogram 2D Echocardiogram has been performed.  Oneal Deputy Will Heinkel RDCS 05/28/2022, 2:54 PM

## 2022-05-28 NOTE — Consult Note (Signed)
NEUROLOGY CONSULTATION NOTE   Date of service: May 28, 2022 Patient Name: Jeffery Benson MRN:  [id number] DOB:  1976/02/12 Reason for consult: "dizziness, aphasia and Right sided numbness" Requesting Provider: Ripley Fraise, MD _ _ _   _ __   _ __ _ _  __ __   _ __   __ _  History of Present Illness  Jeffery Benson is a 47 y.o. male with PMH significant for recent diagnosis of acute systolic CHF with EF of 20 to 25%, hypertension who presents with episode of dizziness, right-sided weakness and aphasia.  At 10:30 AM on 05/27/2022, patient was in the office and turn his head around to talk to his boss.  He felt very dizzy, was having significant trouble with language and having a difficult time understanding or speaking and he also felt weak on the right side.  Since then, he has noted that he has been having some trouble picking things up with his right hand.  The full episode lasted 3 to 4 minutes but the trouble with picking things up with his hands is still ongoing and he feels very clumsy on the right side.  No prior history of strokes, denies any headaches, does not smoke, does not use any recreational substances.  He had MRI brain without contrast which demonstrated patchy left MCA territory infarcts.  LKW: 10:30 AM on 05/27/2022. mRS: 0 tNKASE: Not offered he is he is outside the window. Thrombectomy: Not offered is too mild to treat. NIHSS components Score: Comment  1a Level of Conscious 0[x]  1[]  2[]  3[]      1b LOC Questions 0[x]  1[]  2[]       1c LOC Commands 0[x]  1[]  2[]       2 Best Gaze 0[x]  1[]  2[]       3 Visual 0[x]  1[]  2[]  3[]      4 Facial Palsy 0[x]  1[]  2[]  3[]      5a Motor Arm - left 0[x]  1[]  2[]  3[]  4[]  UN[]    5b Motor Arm - Right 0[x]  1[]  2[]  3[]  4[]  UN[]    6a Motor Leg - Left 0[x]  1[]  2[]  3[]  4[]  UN[]    6b Motor Leg - Right 0[]  1[]  2[]  3[]  4[]  UN[]    7 Limb Ataxia 0[x]  1[]  2[]  3[]  UN[]     8 Sensory 0[]  1[x]  2[]  UN[]      9 Best Language 0[x]  1[]  2[]  3[]      10  Dysarthria 0[x]  1[]  2[]  UN[]      11 Extinct. and Inattention 0[x]  1[]  2[]       TOTAL: 1       ROS   Constitutional Denies weight loss, fever and chills.   HEENT Denies changes in vision and hearing.   Respiratory Denies SOB and cough.   CV Denies palpitations and CP   GI Denies abdominal pain, nausea, vomiting and diarrhea.   GU Denies dysuria and urinary frequency.   MSK Denies myalgia and joint pain.   Skin Denies rash and pruritus.   Neurological Denies headache and syncope.   Psychiatric Denies recent changes in mood. Denies anxiety and depression.    Past History   Past Medical History:  Diagnosis Date   History of chickenpox    Hypertension    Myocardial injury 05/09/2022   Numbness and tingling of foot 10/31/2011   Rupture of right quadriceps tendon 08/14/2016   Past Surgical History:  Procedure Laterality Date   ACHILLES TENDON REPAIR  2011   QUADRICEPS TENDON REPAIR Right 08/14/2016   Procedure:  REPAIR QUADRICEP TENDON;  Surgeon: Samson Frederic, MD;  Location: [facility] ORS;  Service: Orthopedics;  Laterality: Right;   RIGHT/LEFT HEART CATH AND CORONARY ANGIOGRAPHY N/A 05/13/2022   Procedure: RIGHT/LEFT HEART CATH AND CORONARY ANGIOGRAPHY;  Surgeon: Swaziland, Peter M, MD;  Location: [facility] INVASIVE CV LAB;  Service: Cardiovascular;  Laterality: N/A;   SHOULDER HEMI-ARTHROPLASTY Right 11/09/2012   Procedure: RIGHT SHOULDER HEMI-ARTHROPLASTY;  Surgeon: Mable Paris, MD;  Location: [facility] OR;  Service: Orthopedics;  Laterality: Right;   Family History  Problem Relation Age of Onset   Cancer Mother        bladder & ovarian   Diabetes Mother    Cancer Father        colon   Depression Maternal Aunt    Depression Maternal Grandmother    Alcohol abuse Paternal Grandfather    Social History   Socioeconomic History   Marital status: Married    Spouse name: [name]   Number of children: 2   Years of education: Not on file   Highest education level: Master's degree (e.g., MA,  MS, MEng, MEd, MSW, MBA)  Occupational History   Occupation: Scientist, research (life sciences)  Tobacco Use   Smoking status: Never   Smokeless tobacco: Never  Vaping Use   Vaping Use: Never used  Substance and Sexual Activity   Alcohol use: Not on file    Comment: rare   Drug use: No   Sexual activity: Yes  Other Topics Concern   Not on file  Social History Narrative   Not on file   Social Determinants of Health   Financial Resource Strain: Low Risk  (05/13/2022)   Overall Financial Resource Strain (CARDIA)    Difficulty of Paying Living Expenses: Not very hard  Food Insecurity: No Food Insecurity (05/23/2022)   Hunger Vital Sign    Worried About Running Out of Food in the Last Year: Never true    Ran Out of Food in the Last Year: Never true  Transportation Needs: No Transportation Needs (05/23/2022)   PRAPARE - Administrator, Civil Service (Medical): No    Lack of Transportation (Non-Medical): No  Physical Activity: Not on file  Stress: Not on file  Social Connections: Unknown (05/23/2022)   Social Connection and Isolation Panel [NHANES]    Frequency of Communication with Friends and Family: More than three times a week    Frequency of Social Gatherings with Friends and Family: More than three times a week    Attends Religious Services: Not on file    Active Member of Clubs or Organizations: No    Attends Banker Meetings: Never    Marital Status: Married   No Known Allergies  Medications  (Not in a hospital admission)    Vitals   Vitals:   05/27/22 2100 05/27/22 2130 05/27/22 2200 05/27/22 2230  BP: 133/83 124/73 121/73 108/69  Pulse: 62 66 87 68  Resp: 16   16  Temp:      TempSrc:      SpO2: 98% 98% 98% 96%  Weight:      Height:         Body mass index is 50.18 kg/m.  Physical Exam   General: Laying comfortably in bed; in no acute distress.  HENT: Normal oropharynx and mucosa. Normal external appearance of ears and nose.  Neck: Supple, no pain or  tenderness  CV: No JVD. No peripheral edema.  Pulmonary: Symmetric Chest rise. Normal respiratory effort.  Abdomen: Soft to touch,  non-tender.  Ext: No cyanosis, edema, or deformity  Skin: No rash. Normal palpation of skin.   Musculoskeletal: Normal digits and nails by inspection. No clubbing.   Neurologic Examination  Mental status/Cognition: Alert, oriented to self, place, month and year, good attention.  Speech/language: Fluent, comprehension intact, object naming intact, repetition intact.  Cranial nerves:   CN II Pupils equal and reactive to light, no VF deficits    CN III,IV,VI EOM intact, no gaze preference or deviation, no nystagmus    CN V normal sensation in V1, V2, and V3 segments bilaterally    CN VII no asymmetry, no nasolabial fold flattening    CN VIII normal hearing to speech    CN IX & X normal palatal elevation, no uvular deviation    CN XI 5/5 head turn and 5/5 shoulder shrug bilaterally    CN XII midline tongue protrusion    Motor:  Muscle bulk: normal, tone normal, pronator drift mild R pronator drift noted. tremor none Mvmt Root Nerve  Muscle Right Left Comments  SA C5/6 Ax Deltoid 5 5   EF C5/6 Mc Biceps 5 5   EE C6/7/8 Rad Triceps 5 5   WF C6/7 Med FCR     WE C7/8 PIN ECU     F Ab C8/T1 U ADM/FDI 5 5   HF L1/2/3 Fem Illopsoas 5 5   KE L2/3/4 Fem Quad 5 5   DF L4/5 D Peron Tib Ant 5 5   PF S1/2 Tibial Grc/Sol 5 5    Sensation:  Light touch Mildly reduced in right upper extremity to touch.   Pin prick    Temperature    Vibration   Proprioception    Coordination/Complex Motor:  - Finger to Nose intact bilaterally - Heel to shin intact bilaterally - Rapid alternating movement are mildly slowed in right upper extremity. - Gait: Deferred Labs   CBC:  Recent Labs  Lab 05/27/22 1545 05/27/22 1636  WBC 4.9  --   NEUTROABS 2.5  --   HGB 16.4 17.0  HCT 50.3 50.0  MCV 84.7  --   PLT 181  --     Basic Metabolic Panel:  Lab Results  Component  Value Date   NA 136 05/27/2022   K 4.0 05/27/2022   CO2 23 05/27/2022   GLUCOSE 93 05/27/2022   BUN 18 05/27/2022   CREATININE 1.00 05/27/2022   CALCIUM 9.4 05/27/2022   GFRNONAA >60 05/27/2022   GFRAA >60 08/14/2016   Lipid Panel:  Lab Results  Component Value Date   LDLCALC 104 (H) 05/23/2022   HgbA1c:  Lab Results  Component Value Date   HGBA1C 6.5 (H) 05/23/2022   Urine Drug Screen: No results found for: "LABOPIA", "COCAINSCRNUR", "LABBENZ", "AMPHETMU", "THCU", "LABBARB"  Alcohol Level     Component Value Date/Time   ETH <10 05/27/2022 1545    CT Head without contrast(Personally reviewed): CTH was negative for a large hypodensity concerning for a large territory infarct or hyperdensity concerning for an ICH  CT angio Head and Neck with contrast: pending  MRI Brain(Personally reviewed): Patchy L MCA territory infarct  Impression   MADIX BLOWE is a 47 y.o. male with PMH significant for recent diagnosis of acute systolic CHF with EF of 20 to 25%, hypertension who presents with episode of dizziness, right-sided weakness and aphasia. Found to have patchy L MCA infarct.  Full stroke workup is pending but concern that this is likely cardioembolic in the setting of low  EF.  Recommendations  - Frequent Neuro checks per stroke unit protocol - Recommend Vascular imaging with CTA H + N - Recommend obtaining TTE bubble study - Recommend obtaining Lipid panel with LDL - Recent LDL was elevated to 104, will start atorvastatin 40 mg daily. - Recent HbA1c was 6.5. - Antithrombotic -aspirin 81 mg daily along with Plavix 70 mg daily for 21 days, followed by aspirin 81 mg daily alone. - Recommend DVT ppx - SBP goal - permissive hypertension first 24 h < 220/110. Held home meds.  - Recommend Telemetry monitoring for arrythmia - Recommend bedside swallow screen prior to PO intake. - Stroke education booklet - Recommend PT/OT/SLP  consult  ______________________________________________________________________   Thank you for the opportunity to take part in the care of this patient. If you have any further questions, please contact the neurology consultation attending.  Signed,  Erick Blinks [facility] Pager Number [phone] _ _ _   _ __   _ __ _ _  __ __   _ __   __ _

## 2022-05-28 NOTE — Evaluation (Signed)
Occupational Therapy Evaluation Patient Details Name: Jeffery Benson MRN: [id number] DOB: 01-24-1976 Today's Date: 05/28/2022   History of Present Illness 47 y.o. male presents to [facility] hospital on 05/27/2022 with sudden onset dizziness and aphasia, later developing RUE numbness. MRI demonstrates small acute infarcts in L MCA distribution. PMH includes HFrEF, HTN, obesity.   Clinical Impression   Pt admitted with the above diagnosis and overall has returned almost to baseline. Pt with very mild weakness in RLE and some residual tingling in RUE. Pt is able to use RUE to type on computer and to text on phone, tie shoes and complete basic grooming.  Reviewed signs and symptoms of stroke (BEFAST) with pt and encouraged him to come in to ER asap instead of waiting.  Do not feel pt needs further OT at this time.       Recommendations for follow up therapy are one component of a multi-disciplinary discharge planning process, led by the attending physician.  Recommendations may be updated based on patient status, additional functional criteria and insurance authorization.   Follow Up Recommendations  No OT follow up     Assistance Recommended at Discharge None  Patient can return home with the following Other (comment) (no assist)    Functional Status Assessment  Patient has not had a recent decline in their functional status  Equipment Recommendations  None recommended by OT    Recommendations for Other Services       Precautions / Restrictions Precautions Precautions: None Restrictions Weight Bearing Restrictions: No      Mobility Bed Mobility Overal bed mobility: Independent                  Transfers Overall transfer level: Independent Equipment used: None               General transfer comment: no assist needed      Balance Overall balance assessment: Needs assistance Sitting-balance support: No upper extremity supported, Feet supported Sitting balance-Leahy  Scale: Normal     Standing balance support: No upper extremity supported, During functional activity Standing balance-Leahy Scale: Good                             ADL either performed or assessed with clinical judgement   ADL Overall ADL's : At baseline                                       General ADL Comments: Pt overall only with very mild tingling in RUE that is improving. Pt able to do all basic adls. Pt able to use phone, text on phone and type on computer keyboard without difficulties.     Vision Baseline Vision/History: 0 No visual deficits Ability to See in Adequate Light: 0 Adequate Patient Visual Report: No change from baseline Vision Assessment?: No apparent visual deficits     Perception Perception Perception Tested?: Yes Spatial deficits: intact   Praxis Praxis Praxis tested?: Within functional limits    Pertinent Vitals/Pain Pain Assessment Pain Assessment: No/denies pain     Hand Dominance Right   Extremity/Trunk Assessment Upper Extremity Assessment Upper Extremity Assessment: RUE deficits/detail RUE Deficits / Details: PROM/Strength WFL RUE Sensation: decreased light touch RUE Coordination: decreased fine motor   Lower Extremity Assessment Lower Extremity Assessment: Defer to PT evaluation RLE Deficits / Details: marginal weakness, 4+/5  Cervical / Trunk Assessment Cervical / Trunk Assessment: Other exceptions Cervical / Trunk Exceptions: obesity   Communication Communication Communication: No difficulties   Cognition Arousal/Alertness: Awake/alert Behavior During Therapy: WFL for tasks assessed/performed Overall Cognitive Status: Within Functional Limits for tasks assessed                                       General Comments  Pt appears to be returing to baseline level of functioning outside of some very mild tinglining in part of R arm.    Exercises Exercises: Other exercises Other  Exercises Other Exercises: Pt given home exercise program for fine motor exercises at home and was encouraged to continue doing all normal adls with RUE including using phone/computer etc.   Shoulder Instructions      Home Living Family/patient expects to be discharged to:: Private residence Living Arrangements: Spouse/significant other;Children Available Help at Discharge: Family;Available PRN/intermittently Type of Home: House Home Access: Level entry     Home Layout: Two level Alternate Level Stairs-Number of Steps: 14 Alternate Level Stairs-Rails: Right;Left Bathroom Shower/Tub: Walk-in shower;Door   [employer] Toilet: Standard     Home Equipment: None          Prior Functioning/Environment Prior Level of Function : Independent/Modified Independent;Working/employed;Driving             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        OT Problem List: Impaired sensation      OT Treatment/Interventions:      OT Goals(Current goals can be found in the care plan section) Acute Rehab OT Goals Patient Stated Goal: none OT Goal Formulation: All assessment and education complete, DC therapy  OT Frequency:      Co-evaluation              AM-PAC OT "6 Clicks" Daily Activity     Outcome Measure Help from another person eating meals?: None Help from another person taking care of personal grooming?: None Help from another person toileting, which includes using toliet, bedpan, or urinal?: None Help from another person bathing (including washing, rinsing, drying)?: None Help from another person to put on and taking off regular upper body clothing?: None Help from another person to put on and taking off regular lower body clothing?: None 6 Click Score: 24   End of Session Nurse Communication: Mobility status  Activity Tolerance: Patient tolerated  treatment well Patient left: in bed;with call bell/phone within reach;with family/visitor present  OT Visit Diagnosis: Muscle weakness (generalized) (M62.81)                Time: 8242-3536 OT Time Calculation (min): 12 min Charges:  OT General Charges $OT Visit: 1 Visit OT Evaluation $OT Eval Low Complexity: 1 Low  Glenford Peers 05/28/2022, 9:34 AM

## 2022-05-28 NOTE — ED Notes (Signed)
Sleep study member in room with patient applying machine.

## 2022-05-29 DIAGNOSIS — I6300 Cerebral infarction due to thrombosis of unspecified precerebral artery: Secondary | ICD-10-CM | POA: Diagnosis not present

## 2022-05-29 DIAGNOSIS — I5022 Chronic systolic (congestive) heart failure: Secondary | ICD-10-CM

## 2022-05-29 DIAGNOSIS — I639 Cerebral infarction, unspecified: Secondary | ICD-10-CM | POA: Diagnosis not present

## 2022-05-29 DIAGNOSIS — I63412 Cerebral infarction due to embolism of left middle cerebral artery: Secondary | ICD-10-CM | POA: Diagnosis not present

## 2022-05-29 LAB — CBC
HCT: 47 % (ref 39.0–52.0)
Hemoglobin: 15.8 g/dL (ref 13.0–17.0)
MCH: 28.1 pg (ref 26.0–34.0)
MCHC: 33.6 g/dL (ref 30.0–36.0)
MCV: 83.5 fL (ref 80.0–100.0)
Platelets: 159 10*3/uL (ref 150–400)
RBC: 5.63 MIL/uL (ref 4.22–5.81)
RDW: 13.2 % (ref 11.5–15.5)
WBC: 3.8 10*3/uL — ABNORMAL LOW (ref 4.0–10.5)
nRBC: 0 % (ref 0.0–0.2)

## 2022-05-29 LAB — BASIC METABOLIC PANEL
Anion gap: 9 (ref 5–15)
BUN: 15 mg/dL (ref 6–20)
CO2: 22 mmol/L (ref 22–32)
Calcium: 9.4 mg/dL (ref 8.9–10.3)
Chloride: 102 mmol/L (ref 98–111)
Creatinine, Ser: 1.13 mg/dL (ref 0.61–1.24)
GFR, Estimated: >60 mL/min (ref >60)
Glucose, Bld: 97 mg/dL (ref 70–99)
Potassium: 3.9 mmol/L (ref 3.5–5.1)
Sodium: 133 mmol/L — ABNORMAL LOW (ref 135–145)

## 2022-05-29 LAB — URINALYSIS, ROUTINE W REFLEX MICROSCOPIC
Bacteria, UA: NONE SEEN
Bilirubin Urine: NEGATIVE
Glucose, UA: >=500 mg/dL — AB
Hgb urine dipstick: NEGATIVE
Ketones, ur: NEGATIVE mg/dL
Leukocytes,Ua: NEGATIVE
Nitrite: NEGATIVE
Protein, ur: NEGATIVE mg/dL
Specific Gravity, Urine: 1.025 (ref 1.005–1.030)
pH: 5 (ref 5.0–8.0)

## 2022-05-29 LAB — RAPID URINE DRUG SCREEN, HOSP PERFORMED
Amphetamines: NOT DETECTED
Barbiturates: NOT DETECTED
Benzodiazepines: NOT DETECTED
Cocaine: NOT DETECTED
Opiates: NOT DETECTED
Tetrahydrocannabinol: NOT DETECTED

## 2022-05-29 LAB — CBG MONITORING, ED
Glucose-Capillary: 105 mg/dL — ABNORMAL HIGH (ref 70–99)
Glucose-Capillary: 90 mg/dL (ref 70–99)
Glucose-Capillary: 110 mg/dL — ABNORMAL HIGH (ref 70–99)

## 2022-05-29 LAB — GLUCOSE, CAPILLARY: Glucose-Capillary: 139 mg/dL — ABNORMAL HIGH (ref 70–99)

## 2022-05-29 LAB — BRAIN NATRIURETIC PEPTIDE: B Natriuretic Peptide: 74.8 pg/mL (ref 0.0–100.0)

## 2022-05-29 MED ORDER — SPIRONOLACTONE 12.5 MG HALF TABLET
12.5000 mg | ORAL_TABLET | Freq: Every day | ORAL | Status: DC
  Administered 2022-05-29 – 2022-05-31 (×3): 12.5 mg via ORAL
  Filled 2022-05-29 (×3): qty 1

## 2022-05-29 MED ORDER — FUROSEMIDE 40 MG PO TABS
40.0000 mg | ORAL_TABLET | Freq: Every day | ORAL | Status: DC
  Administered 2022-05-29 – 2022-05-31 (×3): 40 mg via ORAL
  Filled 2022-05-29: qty 2
  Filled 2022-05-29 (×2): qty 1

## 2022-05-29 NOTE — ED Notes (Signed)
I just took over  it appears that there are supposed to be every four hour nih but the last one seen was 2000

## 2022-05-29 NOTE — Consult Note (Addendum)
Advanced Heart Failure Team Consult Note   Primary Physician: [name], MD PCP-Cardiologist:  None  Reason for Consultation: Acute on chronic combined systolic and diastolic heart failure  HPI:    Jeffery Benson is seen today for evaluation of acute on chronic combined systolic and diastolic heart failure at the request of Dr. Ninetta Lights.   [name] is a 47 y.o. male with systolic heart failure (diagnosed 12/23), HTN, HLD, and obesity.   He was recently admitted 12/29-1/2 with acute systolic heart failure.  He could not walk 5 feet without getting short of breath at the time.  Echo EF 20-25%. Diuresed well with IV lasix. Discharged on GDMT and scheduled for HF TOC appt.   Presented to [facility] appt 1/16. Rapid response called with concern for stroke and was taken to the ED.  During brief clinic visit he reported was having difficulty articulating his words while at work and felt weakness on the right side.  He had been doing great since recent discharge.  Started going back to the gym 1/15.  Denies SOB or CP.  Denies any recent sick contacts.  Reports full med compliance.  Reports complete diet change, only eating healthy food and watching his fluid intake.   Since being in the ED brain MRI demonstrated patchy left MCA territory infarcts.  Neurology following.  Sleeps smart study being completed.  Per patient report they got the diagnosis of obstructive sleep apnea (personally could not find results).  Labs WNL. HsTrop 32>> 34>> 31>> 27.   Remains in ED. Wife at bedside. Denies CP/SOB.   Cardiac studies reviewed 05/28/22: bubble study showed no shunt 12/23: EF 20-25%, LV with global hypokinesis, GIIDD, RV function normal, LA severly dilated, mild MR, no TR [facility] 05/13/22: no CAD, normal coronary anatomy, Mildly elevated LV filling pressures. PCWP 26/24 with mean 22 mm Hg. LVEDP 32 mm Hg, Mild pulmonary HTN PAP 46/12 with mean 27 mm Hg, Reduced Cardiac output 5.56 liters/min with index  1.82  Home Medications Prior to Admission medications   Medication Sig Start Date End Date Taking? Authorizing Provider  carvedilol (COREG) 3.125 MG tablet Take 1 tablet (3.125 mg total) by mouth 2 (two) times daily with a meal. 05/13/22  Yes Danford, Earl Lites, MD  empagliflozin (JARDIANCE) 10 MG TABS tablet Take 1 tablet (10 mg total) by mouth daily. 05/14/22  Yes Danford, Earl Lites, MD  furosemide (LASIX) 40 MG tablet Take 1 tablet (40 mg total) by mouth daily. 05/14/22  Yes Danford, Earl Lites, MD  metFORMIN (GLUCOPHAGE-XR) 500 MG 24 hr tablet Take 500 mg by mouth daily with breakfast.   Yes [provider]  potassium chloride SA (KLOR-CON M) 20 MEQ tablet Take 1 tablet (20 mEq total) by mouth daily. 05/14/22  Yes Danford, Earl Lites, MD  sacubitril-valsartan (ENTRESTO) 24-26 MG Take 1 tablet by mouth 2 (two) times daily. 05/13/22  Yes Danford, Earl Lites, MD  spironolactone (ALDACTONE) 25 MG tablet Take 1/2 tablet (12.5 mg total) by mouth daily. 05/14/22  Yes Danford, Earl Lites, MD    Past Medical History: Past Medical History:  Diagnosis Date   History of chickenpox    Hypertension    Myocardial injury 05/09/2022   Numbness and tingling of foot 10/31/2011   Rupture of right quadriceps tendon 08/14/2016    Past Surgical History: Past Surgical History:  Procedure Laterality Date   ACHILLES TENDON REPAIR  2011   QUADRICEPS TENDON REPAIR Right 08/14/2016   Procedure: REPAIR QUADRICEP  TENDON;  Surgeon: Rod Can, MD;  Location: [facility];  Service: Orthopedics;  Laterality: Right;   RIGHT/LEFT HEART CATH AND CORONARY ANGIOGRAPHY N/A 05/13/2022   Procedure: RIGHT/LEFT HEART CATH AND CORONARY ANGIOGRAPHY;  Surgeon: Martinique, Peter M, MD;  Location: [place] [facility];  Service: Cardiovascular;  Laterality: N/A;   SHOULDER HEMI-ARTHROPLASTY Right 11/09/2012   Procedure: RIGHT SHOULDER HEMI-ARTHROPLASTY;  Surgeon: Nita Sells, MD;  Location: [place];  Service:  Orthopedics;  Laterality: Right;    Family History: Family History  Problem Relation Age of Onset   Cancer Mother        bladder & ovarian   Diabetes Mother    Cancer Father        colon   Depression Maternal Aunt    Depression Maternal Grandmother    Alcohol abuse Paternal Grandfather     Social History: Social History   Socioeconomic History   Marital status: Married    Spouse name: [name]   Number of children: 2   Years of education: Not on file   Highest education level: Master's degree (e.g., MA, MS, MEng, MEd, MSW, MBA)  Occupational History   Occupation: Development worker, community  Tobacco Use   Smoking status: Never   Smokeless tobacco: Never  Vaping Use   Vaping Use: Never used  Substance and Sexual Activity   Alcohol use: Not on file    Comment: rare   Drug use: No   Sexual activity: Yes  Other Topics Concern   Not on file  Social History Narrative   Not on file   Social Determinants of Health   Financial Resource Strain: Low Risk  (05/13/2022)   Overall Financial Resource Strain (CARDIA)    Difficulty of Paying Living Expenses: Not very hard  Food Insecurity: No Food Insecurity (05/23/2022)   Hunger Vital Sign    Worried About Running Out of Food in the Last Year: Never true    Ran Out of Food in the Last Year: Never true  Transportation Needs: No Transportation Needs (05/23/2022)   PRAPARE - Hydrologist (Medical): No    Lack of Transportation (Non-Medical): No  Physical Activity: Not on file  Stress: Not on file  Social Connections: Unknown (05/23/2022)   Social Connection and Isolation Panel [NHANES]    Frequency of Communication with Friends and Family: More than three times a week    Frequency of Social Gatherings with Friends and Family: More than three times a week    Attends Religious Services: Not on file    Active Member of Clubs or Organizations: No    Attends Archivist Meetings: Never    Marital Status: Married     Allergies:  Allergies  Allergen Reactions   Beef Fat (Tallow) [Tallow]     Does not eat ground beef.   Pork-Derived Products     Patient preference.    Objective:    Vital Signs:   Temp:  [97.7 F (36.5 C)-98.2 F (36.8 C)] 97.9 F (36.6 C) (01/18 0828) Pulse Rate:  [52-103] 80 (01/18 0828) Resp:  [11-28] 21 (01/18 0828) BP: (99-144)/(64-107) 122/94 (01/18 0828) SpO2:  [92 %-100 %] 98 % (01/18 0828) Last BM Date : 05/26/22  Weight change: Filed Weights   05/27/22 1539  Weight: (!) 187 kg    Intake/Output:  No intake or output data in the 24 hours ending 05/29/22 1009    Physical Exam    General:  well appearing.  No respiratory  difficulty HEENT: normal Neck: supple. JVD ~8 cm. Carotids 2+ bilat; no bruits. No lymphadenopathy or thyromegaly appreciated. Cor: PMI nondisplaced. Regular rate & rhythm. No rubs, gallops or murmurs. Lungs: clear Abdomen: soft, nontender, nondistended. No hepatosplenomegaly. No bruits or masses. Good bowel sounds. Extremities: no cyanosis, clubbing, rash, edema  Neuro: alert & oriented x 3, cranial nerves grossly intact. moves all 4 extremities w/o difficulty. Affect pleasant.   Telemetry   NSR 90s 0-5 PVCs/hr (Personally reviewed)    EKG    NSR 81 bpm  Labs   Basic Metabolic Panel: Recent Labs  Lab 05/23/22 1202 05/27/22 1545 05/27/22 1636 05/28/22 0525  NA 136 132* 136 135  K 5.1 4.0 4.0 4.0  CL 100 98 101 99  CO2 22 23  --  27  GLUCOSE 98 91 93 93  BUN 14 15 18 13   CREATININE 1.05 1.03 1.00 1.06  CALCIUM 10.2 9.4  --  9.2  MG 2.2  --   --   --     Liver Function Tests: Recent Labs  Lab 05/27/22 1545  AST 32  ALT 37  ALKPHOS 52  BILITOT 0.8  PROT 7.2  ALBUMIN 4.2   No results for input(s): "LIPASE", "AMYLASE" in the last 168 hours. No results for input(s): "AMMONIA" in the last 168 hours.  CBC: Recent Labs  Lab 05/27/22 1545 05/27/22 1636 05/28/22 0525  WBC 4.9  --  5.0  NEUTROABS 2.5  --    --   HGB 16.4 17.0 16.2  HCT 50.3 50.0 48.6  MCV 84.7  --  84.1  PLT 181  --  166    Cardiac Enzymes: No results for input(s): "CKTOTAL", "CKMB", "CKMBINDEX", "TROPONINI" in the last 168 hours.  BNP: BNP (last 3 results) Recent Labs    05/09/22 1736  BNP 406.6*    ProBNP (last 3 results) No results for input(s): "PROBNP" in the last 8760 hours.   CBG: Recent Labs  Lab 05/28/22 0849 05/28/22 1236 05/28/22 2124 05/29/22 0821  GLUCAP 105* 102* 116* 105*    Coagulation Studies: Recent Labs    05/27/22 1545  LABPROT 13.8  INR 1.1     Imaging   ECHOCARDIOGRAM LIMITED BUBBLE STUDY  Result Date: 05/28/2022    ECHOCARDIOGRAM LIMITED REPORT   Patient Name:   ADONUS USELMAN Date of Exam: 05/28/2022 Medical Rec #:  05/30/2022      Height:       76.0 in Accession #:    [id number]     Weight:       412.3 lb Date of Birth:  27-Feb-1976       BSA:          3.013 m Patient Age:    47 years       BP:           132/64 mmHg Patient Gender: M              HR:           81 bpm. Exam Location:  Inpatient Procedure: Limited Echo and Saline Contrast Bubble Study Indications:    Stroke i63.9  History:        Patient has prior history of Echocardiogram examinations, most                 recent 05/10/2022. Risk Factors:Hypertension and Sleep Apnea.  Sonographer:    05/12/2022 Senior RDCS Referring Phys: Irving Burton  Sonographer Comments: Poor visualization in apical widow, bubble performed  from parasternals. IMPRESSIONS  1. Limited study for microcavitation; poor apical images; injection performed and images recorded from parasternal views with no obvious shunt noted but technically difficult. FINDINGS  IAS/Shunts: Agitated saline contrast was given intravenously to evaluate for intracardiac shunting. Additional Comments: Limited study for microcavitation; poor apical images; injection performed and images recorded from parasternal views with no obvious shunt noted but technically difficult.  Olga Millers MD Electronically signed by Olga Millers MD Signature Date/Time: 05/28/2022/3:01:28 PM    Final      Medications:     Current Medications:  aspirin EC  81 mg Oral Daily   atorvastatin  40 mg Oral Daily   clopidogrel  75 mg Oral Daily   empagliflozin  10 mg Oral Daily   enoxaparin (LOVENOX) injection  90 mg Subcutaneous Daily   insulin aspart  0-5 Units Subcutaneous QHS   insulin aspart  0-9 Units Subcutaneous TID WC   spironolactone  12.5 mg Oral Daily    Infusions:     Patient Profile   Mr. Jentz is a 47 y.o. male with systolic heart failure (diagnosed 12/23), HTN, HLD, and obesity.  Presented to [facility] clinic 1/16, rapid response called for Code Stroke. AHF team asked to see for acute on chronic combined systolic and diastolic heart failure. Concerns for cardioembolic stroke.   Assessment/Plan  Left MCA Stroke - MRI demonstrated patchy left MCA territory infarcts. - Concern for possible cardioembolic nature. During recent admission had runs on NSVT. PVC burden does not appear high while here.  - Symptoms much improved, nearly resolved - No antithrombotic prior to admission, now on ASA 81 mg daily and clopidogrel 75 mg daily.x 3 weeks and then aspirin alone, per neurology recs - TTE w/ bubble, no shunt seen - Will need TEE to better assess. May need [facility] - Plan for loop recorder prior to d/c, if not may need Zio - sleep study being completed - permissive hypertension per neurology  Acute on chronic combines systolic and diastolic heart failure - Echo 66/44: EF 20-25%, LV with global hypokinesis, GIIDD, RV function normal, LA severly dilated, mild MR, no TR - L/[facility] 05/13/22: no CAD, PCWP 22, LVEDP 32, Mild pulmonary HTN PAP 46/12 with mean 27 mm Hg, CO 5.56, CI 1.82 - Bubble TTE yesterday showed no significant shunt.  - suspected CM d/t uncontrolled HTN vs PVCs (although burden not high on tele) - NYHA II, volume appears stable. Will restart home lasix 40 mg daily -  Continue Jardiance 10 mg daily - Entresto and coreg on hold with permissive hypertension per neurology - Continue spironolactone 12.5 mg daily - labs ordered - strict I&O, daily weights  HTN - Has been stable - GDMT on hold as above  Elevated troponin - HsTrop 32>> 34>> 31>> 27 - Denies CP, EKG with no acute changes. Suspect demand ischemia  HLP - LDL 104 - Continue statin and diet changes  Obesity - Body mass index is 50.18 kg/m.  - encouraged weight loss - goes to the gym and is watching his diet  Obstructive sleep apnea - per patient report was told his sleep study was consistent with OSA - plan for cpap at night  Length of Stay: 0  Alen Bleacher, NP  05/29/2022, 10:09 AM  Advanced Heart Failure Team Pager [phone] (M-F; 7a - 5p)  Please contact [facility] for night-coverage after hours (4p -7a ) and weekends on [email or web address]   Patient seen and examined with the above-signed Advanced Practice Provider  and/or Housestaff. I personally reviewed laboratory data, imaging studies and relevant notes. I independently examined the patient and formulated the important aspects of the plan. I have edited the note to reflect any of my changes or salient points. I have personally discussed the plan with the patient and/or family.  47 y/o male with morbid obesity, HTN, OSA and recent diagnosis of severe systolic HF due to NICM EF 20-25%.  Admitted with CVA. Symptoms now resolved MRI demonstrated patchy left MCA territory infarcts suggestive of cardioembolic etiology  General:  Obese male No resp difficulty HEENT: normal Neck: supple. Hard to see JVP  Carotids 2+ bilat; no bruits. No lymphadenopathy or thryomegaly appreciated. Cor: PMI nondisplaced. Regular rate & rhythm. No rubs, gallops or murmurs. Lungs: clear Abdomen: obese soft, nontender, nondistended. No hepatosplenomegaly. No bruits or masses. Good bowel sounds. Extremities: no cyanosis, clubbing, rash, tr edema with  chronic venous stasis changes Neuro: alert & orientedx3, cranial nerves grossly intact. moves all 4 extremities w/o difficulty. Affect pleasant  CVA almost certainly cardio-embolic in setting of severe LV dysfunction and possible silent AF (high risk with size. LV dysfunction and OSA).  Will start Eliquis. Plan TEE tomorrow. Will discuss ILR with EP.  Continue GDMT for HF. Volume status ok.    D/w Dr. Leonie Man.   Glori Bickers, MD  11:53 PM

## 2022-05-29 NOTE — Plan of Care (Signed)

## 2022-05-29 NOTE — ED Notes (Signed)
ED TO INPATIENT HANDOFF REPORT  ED Nurse Name and Phone #: Suella Grove [phone]  S Name/Age/Gender Jeffery Benson 47 y.o. male Room/Bed: 037C/037C  Code Status   Code Status: Full Code  Home/SNF/Other Home Patient oriented to: self, place, time, and situation Is this baseline? Yes   Triage Complete: Triage complete  Chief Complaint CVA (cerebral vascular accident) [facility]) [I63.9]  Triage Note Pt states while he was at work he stood up, got dizzy and couldn't speak for about 3-4 mins. Pt states he just did not feel right. Denies any pain. No deficits noted at this time.    Allergies Allergies  Allergen Reactions   Beef Fat (Tallow) [Tallow]     Does not eat ground beef.   Pork-Derived Products     Patient preference.    Level of Care/Admitting Diagnosis ED Disposition     ED Disposition  Admit   Condition  --   [place]: [place] [100100]  Level of Care: Telemetry Medical [104]  May place patient in observation at [facility] or [place] if equivalent level of care is available:: No  Covid Evaluation: Asymptomatic - no recent exposure (last 10 days) testing not required  Diagnosis: CVA (cerebral vascular accident) [facility]) [[id number]]  Admitting Physician: Liane Comber  Attending Physician: Johnnye Sima, JEFFREY C [[id number]]          B Medical/Surgery History Past Medical History:  Diagnosis Date   History of chickenpox    Hypertension    Myocardial injury 05/09/2022   Numbness and tingling of foot 10/31/2011   Rupture of right quadriceps tendon 08/14/2016   Past Surgical History:  Procedure Laterality Date   ACHILLES TENDON REPAIR  2011   QUADRICEPS TENDON REPAIR Right 08/14/2016   Procedure: REPAIR QUADRICEP TENDON;  Surgeon: Rod Can, MD;  Location: [facility];  Service: Orthopedics;  Laterality: Right;   RIGHT/LEFT HEART CATH AND CORONARY ANGIOGRAPHY N/A 05/13/2022   Procedure: RIGHT/LEFT HEART CATH AND CORONARY  ANGIOGRAPHY;  Surgeon: Martinique, Peter M, MD;  Location: [place] [facility] LAB;  Service: Cardiovascular;  Laterality: N/A;   SHOULDER HEMI-ARTHROPLASTY Right 11/09/2012   Procedure: RIGHT SHOULDER HEMI-ARTHROPLASTY;  Surgeon: Nita Sells, MD;  Location: [place];  Service: Orthopedics;  Laterality: Right;     A IV Location/Drains/Wounds Patient Lines/Drains/Airways Status     Active Line/Drains/Airways     Name Placement date Placement time Site Days   Peripheral IV 05/27/22 20 G Anterior;Distal;Right;Upper Arm 05/27/22  2047  Arm  2            Intake/Output Last 24 hours No intake or output data in the 24 hours ending 05/29/22 1633  Labs/Imaging Results for orders placed or performed during the hospital encounter of 05/27/22 (from the past 48 hour(s))  I-stat chem 8, ED     Status: None   Collection Time: 05/27/22  4:36 PM  Result Value Ref Range   Sodium 136 135 - 145 mmol/L   Potassium 4.0 3.5 - 5.1 mmol/L   Chloride 101 98 - 111 mmol/L   BUN 18 6 - 20 mg/dL   Creatinine, Ser 1.00 0.61 - 1.24 mg/dL   Glucose, Bld 93 70 - 99 mg/dL    Comment: Glucose reference range applies only to samples taken after fasting for at least 8 hours.   Calcium, Ion 1.15 1.15 - 1.40 mmol/L   TCO2 26 22 - 32 mmol/L   Hemoglobin 17.0 13.0 - 17.0 g/dL  HCT 50.0 39.0 - 52.0 %  Troponin I (High Sensitivity)     Status: Abnormal   Collection Time: 05/27/22  7:55 PM  Result Value Ref Range   Troponin I (High Sensitivity) 32 (H) <18 ng/L    Comment: (NOTE) Elevated high sensitivity troponin I (hsTnI) values and significant  changes across serial measurements may suggest ACS but many other  chronic and acute conditions are known to elevate hsTnI results.  Refer to the "Links" section for chest pain algorithms and additional  guidance. Performed at [facility], [address], [place], [place] [address]   Troponin I (High Sensitivity)     Status: Abnormal   Collection Time:  05/27/22 10:53 PM  Result Value Ref Range   Troponin I (High Sensitivity) 34 (H) <18 ng/L    Comment: (NOTE) Elevated high sensitivity troponin I (hsTnI) values and significant  changes across serial measurements may suggest ACS but many other  chronic and acute conditions are known to elevate hsTnI results.  Refer to the "Links" section for chest pain algorithms and additional  guidance. Performed at [facility], [address], [place], [place] [address]   Basic metabolic panel     Status: None   Collection Time: 05/28/22  5:25 AM  Result Value Ref Range   Sodium 135 135 - 145 mmol/L   Potassium 4.0 3.5 - 5.1 mmol/L   Chloride 99 98 - 111 mmol/L   CO2 27 22 - 32 mmol/L   Glucose, Bld 93 70 - 99 mg/dL    Comment: Glucose reference range applies only to samples taken after fasting for at least 8 hours.   BUN 13 6 - 20 mg/dL   Creatinine, Ser 0.09 0.61 - 1.24 mg/dL   Calcium 9.2 8.9 - 38.1 mg/dL   GFR, Estimated >82 >99 mL/min    Comment: (NOTE) Calculated using the CKD-EPI Creatinine Equation (2021)    Anion gap 9 5 - 15    Comment: Performed at [facility], [address], [place], [place] [address]  CBC     Status: None   Collection Time: 05/28/22  5:25 AM  Result Value Ref Range   WBC 5.0 4.0 - 10.5 K/uL   RBC 5.78 4.22 - 5.81 MIL/uL   Hemoglobin 16.2 13.0 - 17.0 g/dL   HCT 67.8 93.8 - 10.1 %   MCV 84.1 80.0 - 100.0 fL   MCH 28.0 26.0 - 34.0 pg   MCHC 33.3 30.0 - 36.0 g/dL   RDW 75.1 02.5 - 85.2 %   Platelets 166 150 - 400 K/uL    Comment: REPEATED TO VERIFY   nRBC 0.0 0.0 - 0.2 %    Comment: Performed at [facility], [address] [place], [place] [address]  Troponin I (High Sensitivity)     Status: Abnormal   Collection Time: 05/28/22  5:25 AM  Result Value Ref Range   Troponin I (High Sensitivity) 31 (H) <18 ng/L    Comment: (NOTE) Elevated high sensitivity troponin I (hsTnI) values and significant  changes across serial measurements may  suggest ACS but many other  chronic and acute conditions are known to elevate hsTnI results.  Refer to the "Links" section for chest pain algorithms and additional  guidance. Performed at [facility], [address] [place], [place] [address]   Troponin I (High Sensitivity)     Status: Abnormal   Collection Time: 05/28/22  7:33 AM  Result  Value Ref Range   Troponin I (High Sensitivity) 27 (H) <18 ng/L    Comment: (NOTE) Elevated high sensitivity troponin I (hsTnI) values and significant  changes across serial measurements may suggest ACS but many other  chronic and acute conditions are known to elevate hsTnI results.  Refer to the "Links" section for chest pain algorithms and additional  guidance. Performed at [facility], [address] [place], [place] [address]   CBG monitoring, ED     Status: Abnormal   Collection Time: 05/28/22  8:49 AM  Result Value Ref Range   Glucose-Capillary 105 (H) 70 - 99 mg/dL    Comment: Glucose reference range applies only to samples taken after fasting for at least 8 hours.  CBG monitoring, ED     Status: Abnormal   Collection Time: 05/28/22 12:36 PM  Result Value Ref Range   Glucose-Capillary 102 (H) 70 - 99 mg/dL    Comment: Glucose reference range applies only to samples taken after fasting for at least 8 hours.  CBG monitoring, ED     Status: Abnormal   Collection Time: 05/28/22  9:24 PM  Result Value Ref Range   Glucose-Capillary 116 (H) 70 - 99 mg/dL    Comment: Glucose reference range applies only to samples taken after fasting for at least 8 hours.  CBG monitoring, ED     Status: Abnormal   Collection Time: 05/29/22  8:21 AM  Result Value Ref Range   Glucose-Capillary 105 (H) 70 - 99 mg/dL    Comment: Glucose reference range applies only to samples taken after fasting for at least 8 hours.   Comment 1 Notify RN    Comment 2 Document in Chart   CBG monitoring, ED     Status: None   Collection Time: 05/29/22  1:11 PM   Result Value Ref Range   Glucose-Capillary 90 70 - 99 mg/dL    Comment: Glucose reference range applies only to samples taken after fasting for at least 8 hours.   ECHOCARDIOGRAM LIMITED BUBBLE STUDY  Result Date: 05/28/2022    ECHOCARDIOGRAM LIMITED REPORT   Patient Name:   Jeffery Benson Date of Exam: 05/28/2022 Medical Rec #:  [id number]      Height:       76.0 in Accession #:    [id number]     Weight:       412.3 lb Date of Birth:  1975/10/22       BSA:          3.013 m Patient Age:    47 years       BP:           132/64 mmHg Patient Gender: M              HR:           81 bpm. Exam Location:  Inpatient Procedure: Limited Echo and Saline Contrast Bubble Study Indications:    Stroke i63.9  History:        Patient has prior history of Echocardiogram examinations, most                 recent 05/10/2022. Risk Factors:Hypertension and Sleep Apnea.  Sonographer:    Irving Burton Senior RDCS Referring Phys: Erick Blinks  Sonographer Comments: Poor visualization in apical widow, bubble performed from parasternals. IMPRESSIONS  1. Limited study for microcavitation; poor apical images; injection performed and images recorded from parasternal views with no obvious shunt noted but technically difficult. FINDINGS  IAS/Shunts: Agitated saline contrast was given intravenously to evaluate for intracardiac shunting. Additional Comments: Limited study for microcavitation; poor apical images; injection performed and images recorded from parasternal views with no obvious shunt noted but technically difficult.  Olga Millers MD Electronically signed by Olga Millers MD Signature Date/Time: 05/28/2022/3:01:28 PM    Final    CT ANGIO HEAD NECK W WO CM  Result Date: 05/28/2022 CLINICAL DATA:  Dizziness, inability to speak for several minutes; acute infarcts on 05/27/2022 MRI EXAM: CT ANGIOGRAPHY HEAD AND NECK TECHNIQUE: Multidetector CT imaging of the head and neck was performed using the standard protocol during bolus  administration of intravenous contrast. Multiplanar CT image reconstructions and MIPs were obtained to evaluate the vascular anatomy. Carotid stenosis measurements (when applicable) are obtained utilizing NASCET criteria, using the distal internal carotid diameter as the denominator. RADIATION DOSE REDUCTION: This exam was performed according to the departmental dose-optimization program which includes automated exposure control, adjustment of the mA and/or kV according to patient size and/or use of iterative reconstruction technique. CONTRAST:  65mL OMNIPAQUE IOHEXOL 350 MG/ML SOLN COMPARISON:  CT head 05/27/2022, no prior CTA FINDINGS: CT HEAD FINDINGS For noncontrast findings, please see 05/27/2022 CT head. CTA NECK FINDINGS Aortic arch: Two-vessel arch with a common origin of the brachiocephalic and left common carotid arteries. Imaged portion shows no evidence of aneurysm or dissection. No significant stenosis of the major arch vessel origins. Right carotid system: No evidence of stenosis, dissection, or occlusion. Left carotid system: No evidence of stenosis, dissection, or occlusion. Vertebral arteries: No evidence of stenosis, dissection, or occlusion. Skeleton: No acute osseous abnormality. Other neck: Negative. Upper chest: Negative. Review of the MIP images confirms the above findings CTA HEAD FINDINGS Anterior circulation: Both internal carotid arteries are patent to the termini, without significant stenosis. A1 segments patent. Normal anterior communicating artery. Anterior cerebral arteries are patent to their distal aspects. No M1 stenosis or occlusion. MCA branches perfused and symmetric. Posterior circulation: Vertebral arteries patent to the vertebrobasilar junction without stenosis. Posterior inferior cerebellar arteries patent proximally. Basilar patent to its distal aspect. Superior cerebellar arteries patent proximally. Patent P1 segments. PCAs perfused to their distal aspects without stenosis.  The bilateral posterior communicating arteries are not visualized. Venous sinuses: As permitted by contrast timing, patent. Anatomic variants: None significant. Review of the MIP images confirms the above findings IMPRESSION: 1. No intracranial large vessel occlusion or significant stenosis. 2. No hemodynamically significant stenosis in the neck. Electronically Signed   By: Wiliam Ke M.D.   On: 05/28/2022 03:11   MR BRAIN WO CONTRAST  Result Date: 05/28/2022 CLINICAL DATA:  Stroke-like symptoms, dizziness, blurry vision, word-finding difficulty EXAM: MRI HEAD WITHOUT CONTRAST TECHNIQUE: Multiplanar, multiecho pulse sequences of the brain and surrounding structures were obtained without intravenous contrast. COMPARISON:  No prior MRI available, 05/27/2022 CT head FINDINGS: Brain: Small areas of restricted diffusion with ADC correlate in the left posterior insular cortex, subcortical white matter, and posterior frontal lobe white matter (series 2, images 29-33), and left parietal lobe cortex and subcortical white matter (series 2, images 30-37). No acute hemorrhage, mass, mass effect, or midline shift. No hydrocephalus or extra-axial collection. No hemosiderin deposition to suggest remote hemorrhage. Vascular: Normal arterial flow voids. Skull and upper cervical spine: Normal marrow signal. Sinuses/Orbits: Clear paranasal sinuses. No acute finding in the orbits. Other: The mastoids are well aerated. IMPRESSION: Small acute infarcts in the left posterior insular cortex, subcortical white matter, and posterior frontal lobe white matter, as well as  the left parietal lobe cortex and subcortical white matter. These results were called by telephone at the time of interpretation on 05/28/2022 at 12:28 am to provider [facility], who verbally acknowledged these results. Electronically Signed   By: Wiliam Ke M.D.   On: 05/28/2022 00:28   CT HEAD WO CONTRAST  Result Date: 05/27/2022 CLINICAL DATA:  Neuro deficit,  acute stroke suspected. Dizziness at work. Could not speak about 3-4 months. EXAM: CT HEAD WITHOUT CONTRAST TECHNIQUE: Contiguous axial images were obtained from the base of the skull through the vertex without intravenous contrast. RADIATION DOSE REDUCTION: This exam was performed according to the departmental dose-optimization program which includes automated exposure control, adjustment of the mA and/or kV according to patient size and/or use of iterative reconstruction technique. COMPARISON:  None Available. FINDINGS: Brain: No evidence of acute infarction, hemorrhage, hydrocephalus, extra-axial collection or mass lesion/mass effect. Vascular: No hyperdense vessel or unexpected calcification. Skull: Normal. Negative for fracture or focal lesion. Sinuses/Orbits: No acute finding. Other: None. IMPRESSION: No acute intracranial pathology. Electronically Signed   By: Larose Hires D.O.   On: 05/27/2022 16:48    Pending Labs Unresulted Labs (From admission, onward)     Start     Ordered   05/29/22 1028  Brain natriuretic peptide  Once,   R        05/29/22 1027   05/29/22 1027  CBC  Daily,   R      05/29/22 1026   05/29/22 1027  Basic metabolic panel  Daily,   R      05/29/22 1026   05/28/22 1026  Rapid urine drug screen (hospital performed)  ONCE - STAT,   STAT        05/28/22 1025   05/28/22 1026  Urinalysis, Routine w reflex microscopic  Once,   R        05/28/22 1025            Vitals/Pain Today's Vitals   05/29/22 0412 05/29/22 0828 05/29/22 0928 05/29/22 1317  BP: 128/81 (!) 122/94  128/80  Pulse: 81 80  76  Resp: 18 (!) 21  15  Temp: 98.2 F (36.8 C) 97.9 F (36.6 C)  98.7 F (37.1 C)  TempSrc:  Oral  Oral  SpO2: 98% 98%  99%  Weight:      Height:      PainSc: 0-No pain  0-No pain 0-No pain    Isolation Precautions No active isolations  Medications Medications  aspirin EC tablet 81 mg (81 mg Oral Given 05/29/22 0922)  clopidogrel (PLAVIX) tablet 75 mg (75 mg Oral  Given 05/29/22 0923)  atorvastatin (LIPITOR) tablet 40 mg (40 mg Oral Given 05/29/22 0923)  empagliflozin (JARDIANCE) tablet 10 mg (10 mg Oral Given 05/29/22 0923)  insulin aspart (novoLOG) injection 0-9 Units ( Subcutaneous Not Given 05/29/22 1323)  insulin aspart (novoLOG) injection 0-5 Units ( Subcutaneous Not Given 05/28/22 2127)  enoxaparin (LOVENOX) injection 90 mg (90 mg Subcutaneous Given 05/29/22 1312)  spironolactone (ALDACTONE) tablet 12.5 mg (12.5 mg Oral Given 05/29/22 1312)  furosemide (LASIX) tablet 40 mg (40 mg Oral Given 05/29/22 1326)  sodium chloride flush (NS) 0.9 % injection 3 mL (3 mLs Intravenous Given 05/27/22 2048)  iohexol (OMNIPAQUE) 350 MG/ML injection 75 mL (75 mLs Intravenous Contrast Given 05/28/22 0257)   stroke: early stages of recovery book ( Does not apply Given 05/28/22 1647)    Mobility walks     Focused Assessments Neuro Assessment Handoff:  Swallow screen pass?  Yes  Cardiac Rhythm: Normal sinus rhythm NIH Stroke Scale  Dizziness Present: No Headache Present: No Interval: Other (Comment) Level of Consciousness (1a.)   : Alert, keenly responsive LOC Questions (1b. )   : Answers both questions correctly LOC Commands (1c. )   : Performs both tasks correctly Best Gaze (2. )  : Normal Visual (3. )  : No visual loss Facial Palsy (4. )    : Normal symmetrical movements Motor Arm, Left (5a. )   : No drift Motor Arm, Right (5b. ) : No drift Motor Leg, Left (6a. )  : No drift Motor Leg, Right (6b. ) : No drift Limb Ataxia (7. ): Absent Sensory (8. )  : Normal, no sensory loss Best Language (9. )  : No aphasia Dysarthria (10. ): Normal Extinction/Inattention (11.)   : No Abnormality Complete NIHSS TOTAL: 0     Neuro Assessment: Within Defined Limits Neuro Checks:   Shift assessment (05/27/22 2046)  Has TPA been given? No If patient is a Neuro Trauma and patient is going to OR before floor call report to [place] nurse: [phone] or  [phone]   R Recommendations: See Admitting Provider Note  Report given to:   Additional Notes: Pt is followed by neuro and heart failure team. MRI he had showed small acute infarcts in the left posterior insular cortex, Subcortical white matter. And posterior lobe white matter, as e=well as left parietal lobe cortex and subcortical white matter. Echo show EF 20-25%. Pt have Obstructive sleep apnea. Heart failure team said the plan for him to wear Cpap tonight. He is on lasix

## 2022-05-29 NOTE — ED Notes (Signed)
The pt has an irregular pulse multifocal pvcs

## 2022-05-29 NOTE — Progress Notes (Signed)
STROKE TEAM PROGRESS NOTE   INTERVAL HISTORY His wife is at the bedside.  He states he is doing better.  He has no complaints.  Neurological exam is stable.  Vital signs stable.  Patient did sign consent to participate in the sleep smart study and underwent overnight NOx 3 monitor and tested positive for sleep apnea.  Vitals:   05/29/22 0412 05/29/22 0828 05/29/22 1317 05/29/22 1600  BP: 128/81 (!) 122/94 128/80 120/83  Pulse: 81 80 76 76  Resp: 18 (!) 21 15 11   Temp: 98.2 F (36.8 C) 97.9 F (36.6 C) 98.7 F (37.1 C)   TempSrc:  Oral Oral   SpO2: 98% 98% 99% 96%  Weight:      Height:       CBC:  Recent Labs  Lab 05/27/22 1545 05/27/22 1636 05/28/22 0525 05/29/22 1622  WBC 4.9  --  5.0 3.8*  NEUTROABS 2.5  --   --   --   HGB 16.4   < > 16.2 15.8  HCT 50.3   < > 48.6 47.0  MCV 84.7  --  84.1 83.5  PLT 181  --  166 159   < > = values in this interval not displayed.   Basic Metabolic Panel:  Recent Labs  Lab 05/23/22 1202 05/27/22 1545 05/28/22 0525 05/29/22 1622  NA 136   < > 135 133*  K 5.1   < > 4.0 3.9  CL 100   < > 99 102  CO2 22   < > 27 22  GLUCOSE 98   < > 93 97  BUN 14   < > 13 15  CREATININE 1.05   < > 1.06 1.13  CALCIUM 10.2   < > 9.2 9.4  MG 2.2  --   --   --    < > = values in this interval not displayed.   Lipid Panel:  Recent Labs  Lab 05/23/22 1202  CHOL 204*  TRIG 95  HDL 83  CHOLHDL 2.5  LDLCALC 104*   HgbA1c:  Recent Labs  Lab 05/23/22 1202  HGBA1C 6.5*   Urine Drug Screen:  Recent Labs  Lab 05/28/22 1639  LABOPIA NONE DETECTED  COCAINSCRNUR NONE DETECTED  LABBENZ NONE DETECTED  AMPHETMU NONE DETECTED  THCU NONE DETECTED  LABBARB NONE DETECTED    Alcohol Level  Recent Labs  Lab 05/27/22 1545  ETH <10    IMAGING past 24 hours No results found.  PHYSICAL EXAM  Physical Exam  Constitutional: Obese middle-aged African-American male not in distress Cardiovascular: Normal rate and regular rhythm.  Respiratory:  Effort normal, non-labored breathing  Neuro: Mental Status: Patient is awake, alert, oriented to person, place, month, year, and situation. Patient is able to give a clear and coherent history. No signs of aphasia or neglect.  No dysarthria Cranial Nerves: II: Visual Fields are full. Pupils are equal, round, and reactive to light.   III,IV, VI: EOMI without ptosis or diploplia.  V: Facial sensation is symmetric to temperature VII: Facial movement is symmetric resting and smiling VIII: Hearing is intact to voice X: Palate elevates symmetrically XI: Shoulder shrug is symmetric. XII: Tongue protrudes midline without atrophy or fasciculations.  Motor: Tone is normal. Bulk is normal. 5/5 strength was present in all four extremities.  Diminished fine finger movements on the right.  Orbits left or right upper extremity. Sensory: Sensation is symmetric to light touch. .  Cerebellar: FNF and HKS are intact bilaterally  NIHSS O premorbid modified Rankin score 0    ASSESSMENT/PLAN [name] is a 47 y.o. male with history of HFrEF EF 20-25%, HTN, and obesity who presented with sudden onset dizziness, difficulty speaking and right arm numbness.  Plan for outpatient TEE and loop recorder prior to discharge.  Stroke:  Patchy left MCA territory infarcts  Etiology:  likely cardioembolic  Code Stroke CT head No acute abnormality. ASPECTS 10.    CTA head & neck No LVO MRI  Small acute infarcts in the left posterior insular cortex, subcortical white matter, and posterior frontal lobe white matter, as well as the left parietal lobe cortex and subcortical white matter.  2D Echo EF 20-25% LA severe dilation  LDL 104 HgbA1c 6.5 VTE prophylaxis - Lovenox    Diet   Diet 2 gram sodium Room service appropriate? Yes; Fluid consistency: Thin   Diet NPO time specified   No antithrombotic prior to admission, now on aspirin 81 mg daily and clopidogrel 75 mg daily.x 3 weeks and then aspirin  alone Therapy recommendations:  No follow up  Disposition:  Pending  Hypertension Home meds:  Coreg, entresto, aldactone, lasix  Stable Permissive hypertension (OK if < 220/120) but gradually normalize in 5-7 days Long-term BP goal normotensive  Hyperlipidemia LDL 104, goal < 70 Add atorvastatin 40mg  daily   Continue statin at discharge  Diabetes type II Controlled Home meds:  Metformin HgbA1c 6.5, goal < 7.0 CBGs Recent Labs    05/29/22 0821 05/29/22 1311 05/29/22 1635  GLUCAP 105* 90 110*    SSI  Other Stroke Risk Factors Substance abuse - UDS:  THC No results found for requested labs within last 1095 days., Cocaine No results found for requested labs within last 1095 days.. Patient advised to stop using due to stroke risk. Obesity, Body mass index is 50.18 kg/m., BMI >/= 30 associated with increased stroke risk, recommend weight loss, diet and exercise as appropriate  Congestive heart failure- heart cath on 05/13/2022 Normal coronary anatomy Mildly elevated LV filling pressures. PCWP 26/24 with mean 22 mm Hg. LVEDP 32 mm Hg Mild pulmonary HTN PAP 46/12 with mean 27 mm Hg Reduced Cardiac output 5.56 liters/min with index 1.82 Home Meds: entresto, aldactone, jardiance, lasix  Obstructive sleep apnea Sleep Smart Study  Other [facility] day # 0 Patient appears neurologically stable.  He signed consent to participate in sleep smart study and tested positive on the NOx 3 monitor.  He will undergo CPAP mask titration tonight. Continue aspirin and Plavix for 3 weeks followed by aspirin alone and aggressive risk factor modification.  Will consult heart failure team to help optimize his cardiac status.  Discussed with Dr. Haroldine Laws Greater than 50% time during this 35-minute visit was spent in counseling and coordination of care about his embolic stroke and discussion about evaluation, treatment and prevention and answering questions.  Antony Contras, MD Medical  Director [facility] Pager: [phone] 05/29/2022 5:59 PM   To contact Stroke Continuity provider, please refer to [email or web address] After hours, contact General Neurology

## 2022-05-29 NOTE — Progress Notes (Signed)
HD#0 Subjective:   Summary: [name] is a 47 year- old person with PMH of HFrEF EF 20-25%, HTN, and obesity who presented with sudden onset dizziness, difficulty speaking and right arm numbness.   Overnight Events: NAEO.  Patient states he is feeling better today and that he feels back to normal. Denies any speech issues or hand clumsiness again. Reports he has been able to get up and go to restroom and wash up fine. Reports he had sleep study last night.  Objective:  Vital signs in last 24 hours: Vitals:   05/29/22 0000 05/29/22 0310 05/29/22 0412 05/29/22 0828  BP: 99/70 (!) 114/94 128/81 (!) 122/94  Pulse: 71 (!) 52 81 80  Resp: 19 12 18  (!) 21  Temp: 97.9 F (36.6 C)  98.2 F (36.8 C) 97.9 F (36.6 C)  TempSrc: Oral   Oral  SpO2: 95% 99% 98% 98%  Weight:      Height:       Supplemental O2: Room Air SpO2: 98 %   Physical Exam:  Constitutional: obese male, sitting in hospital bed, in no acute distress HENT: normocephalic atraumatic, mucous membranes moist, PERRL Eyes: conjunctiva non-erythematous Neck: supple Cardiovascular: distant heart sounds, regular rate and rhythm, no lower extremity edema Pulmonary/Chest: normal work of breathing on room air, lungs clear to auscultation anteriorly MSK: normal bulk and tone Neurological: alert & oriented x 3, cranial nerves intact, non-dysarthric, 5/5 strength in bilateral upper and lower extremities, sensation normal, normal gait Skin: warm and dry Psych: Normal mood and affect  Filed Weights   05/27/22 1539  Weight: (!) 187 kg    No intake or output data in the 24 hours ending 05/29/22 1129  Net IO Since Admission: -647 mL [05/29/22 1129]  Pertinent Labs:    Latest Ref Rng & Units 05/28/2022    5:25 AM 05/27/2022    4:36 PM 05/27/2022    3:45 PM  CBC  WBC 4.0 - 10.5 K/uL 5.0   4.9   Hemoglobin 13.0 - 17.0 g/dL 05/29/2022  03.4  74.2   Hematocrit 39.0 - 52.0 % 48.6  50.0  50.3   Platelets 150 - 400 K/uL 166   181         Latest Ref Rng & Units 05/28/2022    5:25 AM 05/27/2022    4:36 PM 05/27/2022    3:45 PM  CMP  Glucose 70 - 99 mg/dL 93  93  91   BUN 6 - 20 mg/dL 13  18  15    Creatinine 0.61 - 1.24 mg/dL 05/29/2022   6.38   Sodium 135 - 145 mmol/L 135  136  132   Potassium 3.5 - 5.1 mmol/L 4.0  4.0  4.0   Chloride 98 - 111 mmol/L 99  101  98   CO2 22 - 32 mmol/L 27   23   Calcium 8.9 - 10.3 mg/dL 9.2   9.4   Total Protein 6.5 - 8.1 g/dL   7.2   Total Bilirubin 0.3 - 1.2 mg/dL   0.8   Alkaline Phos 38 - 126 U/L   52   AST 15 - 41 U/L   32   ALT 0 - 44 U/L   37     Imaging: ECHOCARDIOGRAM LIMITED BUBBLE STUDY  Result Date: 05/28/2022    ECHOCARDIOGRAM LIMITED REPORT   Patient Name:   Jeffery Benson Date of Exam: 05/28/2022 Medical Rec #:  Jeffery Benson      Height:  76.0 in Accession #:    [id number]     Weight:       412.3 lb Date of Birth:  1975-09-22       BSA:          3.013 m Patient Age:    67 years       BP:           132/64 mmHg Patient Gender: M              HR:           81 bpm. Exam Location:  Inpatient Procedure: Limited Echo and Saline Contrast Bubble Study Indications:    Stroke i63.9  History:        Patient has prior history of Echocardiogram examinations, most                 recent 05/10/2022. Risk Factors:Hypertension and Sleep Apnea.  Sonographer:    Raquel Sarna Senior RDCS Referring Phys: Donnetta Simpers  Sonographer Comments: Poor visualization in apical widow, bubble performed from parasternals. IMPRESSIONS  1. Limited study for microcavitation; poor apical images; injection performed and images recorded from parasternal views with no obvious shunt noted but technically difficult. FINDINGS  IAS/Shunts: Agitated saline contrast was given intravenously to evaluate for intracardiac shunting. Additional Comments: Limited study for microcavitation; poor apical images; injection performed and images recorded from parasternal views with no obvious shunt noted but technically difficult.  Kirk Ruths MD Electronically signed by Kirk Ruths MD Signature Date/Time: 05/28/2022/3:01:28 PM    Final     Assessment/Plan:   Principal Problem:   CVA (cerebral vascular accident) [facility]) Active Problems:   HFrEF (heart failure with reduced ejection fraction) ([place])   Diabetes mellitus ([place])   Patient Summary: Jeffery Benson is a 47 year- old person with PMH of HFrEF EF 20-25%, HTN, and obesity who presented with sudden onset dizziness, difficulty speaking and right arm numbness admitted for acute embolic left MCA stroke.  Acute embolic stroke of left MCA Patient presented with sudden onset expressive aphasia, dizziness, and right arm numbness/ dexterity deficits. MRI showed several left MCA infarcts concerning for embolic stroke. CTA neck with no stenosis. He does not have prior history of atrial fibrillation. Reports resolution of deficits today. Speech, strength and sensation intact on exam. Awaiting loop recorder placement and TEE.  -aspirin 81 mg daily along with Plavix 70 mg daily for 21 days, followed by aspirin 81 mg daily alone.  -will start adding back GDMT now 24 hrs past CVA -frequent neuro checks -TTE yesterday without significant shunt but needs TEE before discharged. This has been discussed with EP. -continue tele monitoring, cardiac heart monitoring at discharge -continue atorvastatin 40 mg and jardiance 10 mg -PT/OT/ SLP -Na restricted diet -lovenox for DVT ppx   HFrEF, NYHA II Patient presented with chest discomfort 12/12 and Echo 12/23 showed new EF 20-25%. R/LHC showed no CAD. He was to establish care with heartcare 1/16, but with new symptoms was sent to ED. Does not appear volume overloaded on exam today. Saturating well on RA. -heart failure consulted, appreciate assistance -TEE while inpatient  -add back spironolactone -holding entresto, carvedilol, furosemide  -Continue jardiance -strict I&O, daily weights   Troponinemia Troponin mildly elevated at 32 to 34,  then down to 31. EKG without ST elevation or t wave inversion.    Diabetes HgbA1c at 6.5%. Medications include jardiance and metformin (started 1/16 and he has not picked up yet). Fasting glucose 105. -SSI -continue jardiance -  restart metformin at discharge   Obesity At risk for OSA BMI 51. STOP BANG elevated at 6 and was referred for sleep study as outpatient.   Best Practice: Diet: low Na VTE: Lovenox Code: Full Code Dispo: Observation with expected length of stay less than 2 midnights. Anticipated Discharge Location: Home Barriers to Discharge:  evaluation with therapies and work-up for embolic source   Dispo: Admit patient to Observation with expected length of stay less than 2 midnights.  Linward Natal MD Internal Medicine Resident PGY-1 Please contact the on call pager after 5 pm and on weekends at [phone].

## 2022-05-30 ENCOUNTER — Encounter (HOSPITAL_COMMUNITY): Payer: Self-pay | Admitting: Infectious Diseases

## 2022-05-30 ENCOUNTER — Encounter (HOSPITAL_COMMUNITY)
Admission: EM | Disposition: A | Discharge status: Home / Self Care | Payer: Self-pay | Source: Home / Self Care | Attending: Emergency Medicine

## 2022-05-30 ENCOUNTER — Other Ambulatory Visit (HOSPITAL_COMMUNITY): Payer: Self-pay

## 2022-05-30 ENCOUNTER — Observation Stay (HOSPITAL_BASED_OUTPATIENT_CLINIC_OR_DEPARTMENT_OTHER): Payer: 59

## 2022-05-30 ENCOUNTER — Observation Stay (HOSPITAL_BASED_OUTPATIENT_CLINIC_OR_DEPARTMENT_OTHER): Payer: 59 | Admitting: Anesthesiology

## 2022-05-30 ENCOUNTER — Observation Stay (HOSPITAL_COMMUNITY): Payer: 59 | Admitting: Anesthesiology

## 2022-05-30 DIAGNOSIS — Z7984 Long term (current) use of oral hypoglycemic drugs: Secondary | ICD-10-CM

## 2022-05-30 DIAGNOSIS — I340 Nonrheumatic mitral (valve) insufficiency: Secondary | ICD-10-CM

## 2022-05-30 DIAGNOSIS — I081 Rheumatic disorders of both mitral and tricuspid valves: Secondary | ICD-10-CM

## 2022-05-30 DIAGNOSIS — E119 Type 2 diabetes mellitus without complications: Secondary | ICD-10-CM | POA: Diagnosis not present

## 2022-05-30 DIAGNOSIS — I110 Hypertensive heart disease with heart failure: Secondary | ICD-10-CM | POA: Diagnosis not present

## 2022-05-30 DIAGNOSIS — I5021 Acute systolic (congestive) heart failure: Secondary | ICD-10-CM

## 2022-05-30 DIAGNOSIS — I509 Heart failure, unspecified: Secondary | ICD-10-CM | POA: Diagnosis not present

## 2022-05-30 DIAGNOSIS — I5022 Chronic systolic (congestive) heart failure: Secondary | ICD-10-CM | POA: Diagnosis not present

## 2022-05-30 DIAGNOSIS — I639 Cerebral infarction, unspecified: Secondary | ICD-10-CM | POA: Diagnosis not present

## 2022-05-30 HISTORY — PX: BUBBLE STUDY: SHX6837

## 2022-05-30 HISTORY — PX: TEE WITHOUT CARDIOVERSION: SHX5443

## 2022-05-30 LAB — BASIC METABOLIC PANEL
Anion gap: 10 (ref 5–15)
BUN: 14 mg/dL (ref 6–20)
CO2: 23 mmol/L (ref 22–32)
Calcium: 9.5 mg/dL (ref 8.9–10.3)
Chloride: 103 mmol/L (ref 98–111)
Creatinine, Ser: 1.01 mg/dL (ref 0.61–1.24)
GFR, Estimated: >60 mL/min (ref >60)
Glucose, Bld: 94 mg/dL (ref 70–99)
Potassium: 3.7 mmol/L (ref 3.5–5.1)
Sodium: 136 mmol/L (ref 135–145)

## 2022-05-30 LAB — CBC
HCT: 45.7 % (ref 39.0–52.0)
Hemoglobin: 15.9 g/dL (ref 13.0–17.0)
MCH: 28.8 pg (ref 26.0–34.0)
MCHC: 34.8 g/dL (ref 30.0–36.0)
MCV: 82.6 fL (ref 80.0–100.0)
Platelets: 141 10*3/uL — ABNORMAL LOW (ref 150–400)
RBC: 5.53 MIL/uL (ref 4.22–5.81)
RDW: 13.1 % (ref 11.5–15.5)
WBC: 4.2 10*3/uL (ref 4.0–10.5)
nRBC: 0 % (ref 0.0–0.2)

## 2022-05-30 LAB — GLUCOSE, CAPILLARY
Glucose-Capillary: 75 mg/dL (ref 70–99)
Glucose-Capillary: 69 mg/dL — ABNORMAL LOW (ref 70–99)
Glucose-Capillary: 145 mg/dL — ABNORMAL HIGH (ref 70–99)

## 2022-05-30 SURGERY — ECHOCARDIOGRAM, TRANSESOPHAGEAL
Anesthesia: Monitor Anesthesia Care

## 2022-05-30 MED ORDER — SODIUM CHLORIDE 0.9 % IV SOLN: INTRAVENOUS | Status: DC

## 2022-05-30 MED ORDER — CARVEDILOL 3.125 MG PO TABS
3.1250 mg | ORAL_TABLET | Freq: Two times a day (BID) | ORAL | Status: DC
  Administered 2022-05-31: 3.125 mg via ORAL
  Filled 2022-05-30: qty 1

## 2022-05-30 MED ORDER — SACUBITRIL-VALSARTAN 24-26 MG PO TABS
1.0000 | ORAL_TABLET | Freq: Two times a day (BID) | ORAL | Status: DC
  Administered 2022-05-30 – 2022-05-31 (×3): 1 via ORAL
  Filled 2022-05-30 (×3): qty 1

## 2022-05-30 MED ORDER — APIXABAN 5 MG PO TABS
5.0000 mg | ORAL_TABLET | Freq: Two times a day (BID) | ORAL | Status: DC
  Administered 2022-05-30 – 2022-05-31 (×4): 5 mg via ORAL
  Filled 2022-05-30 (×3): qty 1

## 2022-05-30 MED ORDER — MIDAZOLAM HCL 2 MG/2ML IJ SOLN
INTRAMUSCULAR | Status: DC | PRN
  Administered 2022-05-30 (×2): 1 mg via INTRAVENOUS

## 2022-05-30 MED ORDER — PROPOFOL 500 MG/50ML IV EMUL
INTRAVENOUS | Status: DC | PRN
  Administered 2022-05-30: 50 ug/kg/min via INTRAVENOUS

## 2022-05-30 MED ORDER — POTASSIUM CHLORIDE CRYS ER 20 MEQ PO TBCR
40.0000 meq | EXTENDED_RELEASE_TABLET | Freq: Once | ORAL | Status: AC
  Administered 2022-05-30: 40 meq via ORAL
  Filled 2022-05-30: qty 2

## 2022-05-30 MED ORDER — GADOBUTROL 1 MMOL/ML IV SOLN
10.0000 mL | Freq: Once | INTRAVENOUS | Status: AC | PRN
  Administered 2022-05-30: 10 mL via INTRAVENOUS

## 2022-05-30 MED ORDER — LIDOCAINE 2% (20 MG/ML) 5 ML SYRINGE
INTRAMUSCULAR | Status: DC | PRN
  Administered 2022-05-30: 60 mg via INTRAVENOUS

## 2022-05-30 NOTE — CV Procedure (Addendum)
    TRANSESOPHAGEAL ECHOCARDIOGRAM   NAME:  CATRELL MORRONE   MRN: [id number] DOB:  04-Jun-1975   ADMIT DATE: 05/27/2022  INDICATIONS:  Stroke  PROCEDURE:   Informed consent was obtained prior to the procedure. The risks, benefits and alternatives for the procedure were discussed and the patient comprehended these risks.  Risks include, but are not limited to, cough, sore throat, vomiting, nausea, somnolence, esophageal and stomach trauma or perforation, bleeding, low blood pressure, aspiration, pneumonia, infection, trauma to the teeth and death.    After a procedural time-out, the patient was sedated by the anesthesia service.The transesophageal probe was inserted in the esophagus and stomach without difficulty and multiple views were obtained.    COMPLICATIONS:    There were no immediate complications.  FINDINGS:  LEFT VENTRICLE: EF = 20-25%. Dilated. Global HK. No LV clot noted  RIGHT VENTRICLE: Mild HK  LEFT ATRIUM: Moderately dialted  LEFT ATRIAL APPENDAGE: No thrombus.   RIGHT ATRIUM: Normal  AORTIC VALVE:  Trileaflet.  Normal  MITRAL VALVE:    Normal. Trivial MR  TRICUSPID VALVE: Normal. Trivial TR  PULMONIC VALVE: Grossly normal.  INTERATRIAL SEPTUM: No PFO or ASD. Bubble study negative.  PERICARDIUM: No effusion  DESCENDING AORTA: Not well visualized   CONCLUSION:  No evidence of intracardiac clot.  Deshara Rossi,MD 12:58 PM

## 2022-05-30 NOTE — Transfer of Care (Signed)
Immediate Anesthesia Transfer of Care Note  Patient: Jeffery Benson  Procedure(s) Performed: TRANSESOPHAGEAL ECHOCARDIOGRAM (TEE)  Patient Location: PACU  Anesthesia Type:MAC  Level of Consciousness: awake, alert , and sedated  Airway & Oxygen Therapy: Patient connected to nasal cannula oxygen  Post-op Assessment: Post -op Vital signs reviewed and stable  Post vital signs: stable  Last Vitals:  Vitals Value Taken Time  BP 119/76 05/30/22 1133  Temp    Pulse 82 05/30/22 1135  Resp 22 05/30/22 1135  SpO2 95 % 05/30/22 1135  Vitals shown include unvalidated device data.  Last Pain:  Vitals:   05/30/22 1014  TempSrc: Temporal  PainSc: 0-No pain      Patients Stated Pain Goal: 0 (20/10/07 1219)  Complications: No notable events documented.

## 2022-05-30 NOTE — Progress Notes (Signed)
STROKE TEAM PROGRESS NOTE   INTERVAL HISTORY His family member is at the bedside.  He just returned from TEE which was normal.  No clots or PFO.Marland Kitchen  Neurological exam is stable.  Vital signs stable.  Patient was able to tolerate the CPAP mask overnight and did not qualify for the sleep smart study and was randomized to medical treatment arm.  Vitals:   05/30/22 1200 05/30/22 1215 05/30/22 1230 05/30/22 1303  BP: 120/88 135/77 138/77 110/72  Pulse: 65 71 70 66  Resp: (!) 22 (!) 25 15   Temp:   98 F (36.7 C) 97.6 F (36.4 C)  TempSrc:    Oral  SpO2: 95% 97% 97% 100%  Weight:      Height:       CBC:  Recent Labs  Lab 05/27/22 1545 05/27/22 1636 05/29/22 1622 05/30/22 0519  WBC 4.9   < > 3.8* 4.2  NEUTROABS 2.5  --   --   --   HGB 16.4   < > 15.8 15.9  HCT 50.3   < > 47.0 45.7  MCV 84.7   < > 83.5 82.6  PLT 181   < > 159 141*   < > = values in this interval not displayed.   Basic Metabolic Panel:  Recent Labs  Lab 05/29/22 1622 05/30/22 0519  NA 133* 136  K 3.9 3.7  CL 102 103  CO2 22 23  GLUCOSE 97 94  BUN 15 14  CREATININE 1.13 1.01  CALCIUM 9.4 9.5   Lipid Panel:  No results for input(s): "CHOL", "TRIG", "HDL", "CHOLHDL", "VLDL", "LDLCALC" in the last 168 hours.  HgbA1c:  No results for input(s): "HGBA1C" in the last 168 hours.  Urine Drug Screen:  Recent Labs  Lab 05/28/22 1639  LABOPIA NONE DETECTED  COCAINSCRNUR NONE DETECTED  LABBENZ NONE DETECTED  AMPHETMU NONE DETECTED  THCU NONE DETECTED  LABBARB NONE DETECTED    Alcohol Level  Recent Labs  Lab 05/27/22 1545  ETH <10    IMAGING past 24 hours No results found.  PHYSICAL EXAM  Physical Exam  Constitutional: Obese middle-aged African-American male not in distress Cardiovascular: Normal rate and regular rhythm.  Respiratory: Effort normal, non-labored breathing  Neuro: Mental Status: Patient is awake, alert, oriented to person, place, month, year, and situation. Patient is able to  give a clear and coherent history. No signs of aphasia or neglect.  No dysarthria Cranial Nerves: II: Visual Fields are full. Pupils are equal, round, and reactive to light.   III,IV, VI: EOMI without ptosis or diploplia.  V: Facial sensation is symmetric to temperature VII: Facial movement is symmetric resting and smiling VIII: Hearing is intact to voice X: Palate elevates symmetrically XI: Shoulder shrug is symmetric. XII: Tongue protrudes midline without atrophy or fasciculations.  Motor: Tone is normal. Bulk is normal. 5/5 strength was present in all four extremities.  Diminished fine finger movements on the right.  Orbits left or right upper extremity. Sensory: Sensation is symmetric to light touch. .  Cerebellar: FNF and HKS are intact bilaterally    NIHSS O premorbid modified Rankin score 0    ASSESSMENT/PLAN [name] is a 47 y.o. male with history of HFrEF EF 20-25%, HTN, and obesity who presented with sudden onset dizziness, difficulty speaking and right arm numbness.     Stroke:  Patchy left MCA territory infarcts  Etiology:  likely cardioembolic  Code Stroke CT head No acute abnormality. ASPECTS 10.  CTA head & neck No LVO MRI  Small acute infarcts in the left posterior insular cortex, subcortical white matter, and posterior frontal lobe white matter, as well as the left parietal lobe cortex and subcortical white matter.  2D Echo EF 20-25% LA severe dilation  LDL 104 HgbA1c 6.5 VTE prophylaxis - Lovenox    There are no active orders of the following types: Diet, Nourishments.   No antithrombotic prior to admission, now on aspirin 81 mg daily and clopidogrel 75 mg daily.x 3 weeks and then aspirin alone Therapy recommendations:  No follow up  Disposition:  Pending  Hypertension Home meds:  Coreg, entresto, aldactone, lasix  Stable Permissive hypertension (OK if < 220/120) but gradually normalize in 5-7 days Long-term BP goal  normotensive  Hyperlipidemia LDL 104, goal < 70 Add atorvastatin 40mg  daily   Continue statin at discharge  Diabetes type II Controlled Home meds:  Metformin HgbA1c 6.5, goal < 7.0 CBGs Recent Labs    05/29/22 2123 05/30/22 0903 05/30/22 1306  GLUCAP 139* 75 69*    SSI  Other Stroke Risk Factors Substance abuse - UDS:  THC No results found for requested labs within last 1095 days., Cocaine No results found for requested labs within last 1095 days.. Patient advised to stop using due to stroke risk. Obesity, Body mass index is 50.18 kg/m., BMI >/= 30 associated with increased stroke risk, recommend weight loss, diet and exercise as appropriate  Congestive heart failure- heart cath on 05/13/2022 Normal coronary anatomy Mildly elevated LV filling pressures. PCWP 26/24 with mean 22 mm Hg. LVEDP 32 mm Hg Mild pulmonary HTN PAP 46/12 with mean 27 mm Hg Reduced Cardiac output 5.56 liters/min with index 1.82 Home Meds: entresto, aldactone, jardiance, lasix  Obstructive sleep apnea Sleep Smart Study  Other [facility] day # 0 Patient appears neurologically stable.  He is participating in sleep smart study and was randomized to medical treatment. Change aspirin Plavix to Eliquis as per recommendations from heart failure team and aggressive risk factor modification.  .  Discussed with Dr. Haroldine Laws.  Stroke team will sign off follow-up as an outpatient in the stroke clinic as part of sleep smart study Greater than 50% time during this 35-minute visit was spent in counseling and coordination of care about his embolic stroke and discussion about evaluation, treatment and prevention and answering questions.  Antony Contras, MD Medical Director [facility] Pager: [phone] 05/30/2022 3:36 PM   To contact Stroke Continuity provider, please refer to [email or web address] After hours, contact General Neurology

## 2022-05-30 NOTE — TOC Benefit Eligibility Note (Signed)
Patient Teacher, English as a foreign language completed.    The patient is currently admitted and upon discharge could be taking Eliquis 5 mg.  The current 30 day co-pay is $5.00.   The patient is insured through [place], [place] Patient [place] Patient Advocate Team Direct Number: [phone]  Fax: [phone]

## 2022-05-30 NOTE — Progress Notes (Addendum)
Advanced Heart Failure Rounding Note  PCP-Cardiologist: None   Subjective:    Feels fine this morning. Deficits mostly resolved now.  NPO for TEE today. Loop recorder today.   Denies CP/SOB  Objective:   Weight Range: (!) 187 kg Body mass index is 50.18 kg/m.   Vital Signs:   Temp:  [98.1 F (36.7 C)-98.7 F (37.1 C)] 98.1 F (36.7 C) (01/18 1827) Pulse Rate:  [65-76] 65 (01/18 1827) Resp:  [11-15] 14 (01/18 1827) BP: (120-128)/(71-83) 126/71 (01/18 1827) SpO2:  [96 %-99 %] 96 % (01/18 1600) Last BM Date : 05/28/22  Weight change: Filed Weights   05/27/22 1539  Weight: (!) 187 kg    Intake/Output:  No intake or output data in the 24 hours ending 05/30/22 0843    Physical Exam    General:  well appearing.  No respiratory difficulty HEENT: normal Neck: supple. No JVD. Carotids 2+ bilat; no bruits. No lymphadenopathy or thyromegaly appreciated. Cor: PMI nondisplaced. Regular rate & rhythm. No rubs, gallops or murmurs. Lungs: clear Abdomen: obese, soft, nontender, nondistended. No hepatosplenomegaly. No bruits or masses. Good bowel sounds. Extremities: no cyanosis, clubbing, rash, edema  Neuro: alert & oriented x 3, cranial nerves grossly intact. moves all 4 extremities w/o difficulty. Affect pleasant.   Telemetry   NSR 70s 0-1 PVCs (Personally reviewed)    EKG    No new EKG to review  Labs    CBC Recent Labs    05/27/22 1545 05/27/22 1636 05/29/22 1622 05/30/22 0519  WBC 4.9   < > 3.8* 4.2  NEUTROABS 2.5  --   --   --   HGB 16.4   < > 15.8 15.9  HCT 50.3   < > 47.0 45.7  MCV 84.7   < > 83.5 82.6  PLT 181   < > 159 141*   < > = values in this interval not displayed.    Basic Metabolic Panel Recent Labs    05/29/22 1622 05/30/22 0519  NA 133* 136  K 3.9 3.7  CL 102 103  CO2 22 23  GLUCOSE 97 94  BUN 15 14  CREATININE 1.13 1.01  CALCIUM 9.4 9.5    Liver Function Tests Recent Labs    05/27/22 1545  AST 32  ALT 37  ALKPHOS  52  BILITOT 0.8  PROT 7.2  ALBUMIN 4.2    No results for input(s): "LIPASE", "AMYLASE" in the last 72 hours. Cardiac Enzymes No results for input(s): "CKTOTAL", "CKMB", "CKMBINDEX", "TROPONINI" in the last 72 hours.  BNP: BNP (last 3 results) Recent Labs    05/09/22 1736 05/29/22 1622  BNP 406.6* 74.8     ProBNP (last 3 results) No results for input(s): "PROBNP" in the last 8760 hours.   D-Dimer No results for input(s): "DDIMER" in the last 72 hours. Hemoglobin A1C No results for input(s): "HGBA1C" in the last 72 hours. Fasting Lipid Panel No results for input(s): "CHOL", "HDL", "LDLCALC", "TRIG", "CHOLHDL", "LDLDIRECT" in the last 72 hours. Thyroid Function Tests No results for input(s): "TSH", "T4TOTAL", "T3FREE", "THYROIDAB" in the last 72 hours.  Invalid input(s): "FREET3"  Other results:   Imaging    No results found.   Medications:     Scheduled Medications:  aspirin EC  81 mg Oral Daily   atorvastatin  40 mg Oral Daily   clopidogrel  75 mg Oral Daily   empagliflozin  10 mg Oral Daily   enoxaparin (LOVENOX) injection  90 mg Subcutaneous  Daily   furosemide  40 mg Oral Daily   insulin aspart  0-5 Units Subcutaneous QHS   insulin aspart  0-9 Units Subcutaneous TID WC   potassium chloride  40 mEq Oral Once   spironolactone  12.5 mg Oral Daily    Infusions:   PRN Medications:     Patient Profile   Jeffery Benson is a 47 y.o. male with systolic heart failure (diagnosed 12/23), HTN, HLD, and obesity.  Presented to [facility] clinic 1/16, rapid response called for Code Stroke. AHF team asked to see for acute on chronic combined systolic and diastolic heart failure. Concerns for cardioembolic stroke.   Assessment/Plan  Left MCA Stroke - MRI demonstrated patchy left MCA territory infarcts. - CVA almost certainly cardio-embolic in setting of severe LV dysfunction and possible silent AF (high risk with size. LV dysfunction and OSA).  - Symptoms much  improved, nearly resolved - No antithrombotic prior to admission, now on ASA 81 mg daily and clopidogrel 75 mg daily.x 3 weeks and then aspirin alone, per neurology recs - TTE w/ bubble, no shunt seen - TEE scheduled for today.  - Plan to start Eliquis today - Plan for loop recorder today - sleep study completed. +OSA. Tried CPAP last night, needs some mask adjustments - permissive hypertension per neurology   Acute on chronic combines systolic and diastolic heart failure - Echo 12/23: EF 20-25%, LV with global hypokinesis, GIIDD, RV function normal, LA severly dilated, mild MR, no TR - L/RHC 05/13/22: no CAD, PCWP 22, LVEDP 32, Mild pulmonary HTN PAP 46/12 with mean 27 mm Hg, CO 5.56, CI 1.82 - Bubble TTE yesterday showed no significant shunt.  - suspected CM d/t uncontrolled HTN vs PVCs (although burden not high on tele) - NYHA II, volume appears stable. Will restart home lasix 40 mg daily - Continue Jardiance 10 mg daily - Entresto and coreg on hold with permissive hypertension per neurology - Continue spironolactone 12.5 mg daily - labs ordered - strict I&O, daily weights   HTN - Has been stable - GDMT on hold as above   Elevated troponin - HsTrop 32>> 34>> 31>> 27 - Denies CP, EKG with no acute changes. Suspect demand ischemia   HLP - LDL 104 - Continue statin and diet changes   Obesity - Body mass index is 50.18 kg/m.  - encouraged weight loss - goes to the gym and is watching his diet   Obstructive sleep apnea - sleep study during admission + OSA - CPAP titration started yesterday  Length of Stay: [place], [place]  05/30/2022, 8:43 AM  Advanced Heart Failure Team Pager [phone] (M-F; 7a - 5p)  Please contact [facility] for night-coverage after hours (5p -7a ) and weekends on [email or web address]   Patient seen and examined with the above-signed Advanced Practice Provider and/or Housestaff. I personally reviewed laboratory data, imaging studies and relevant notes.  I independently examined the patient and formulated the important aspects of the plan. I have edited the note to reflect any of my changes or salient points. I have personally discussed the plan with the patient and/or family.  Feels well. No CP or SOB. Neuro deficits resolved  General:  Sitting up in bed  No resp difficulty HEENT: normal Neck: supple. JVP hard to see. Carotids 2+ bilat; no bruits. No lymphadenopathy or thryomegaly appreciated. Cor: PMI nondisplaced. Regular rate & rhythm. No rubs, gallops or murmurs. Lungs: clear Abdomen: obese soft, nontender, nondistended. No hepatosplenomegaly. No bruits or  masses. Good bowel sounds. Extremities: no cyanosis, clubbing, rash, tr edema Neuro: alert & orientedx3, cranial nerves grossly intact. moves all 4 extremities w/o difficulty. Affect pleasant  Given cardio-embolic CVA would switch DAPT to Eliquis. Restart low-dose GDMT. For TEE and ILR today. Procedures discussed. Likely home later today.   Arvilla Meres, MD  10:19 AM

## 2022-05-30 NOTE — Discharge Instructions (Addendum)
Information on my medicine - ELIQUIS (apixaban)  This medication education was reviewed with me or my healthcare representative as part of my discharge preparation.    Why was Eliquis prescribed for you? Eliquis was prescribed for you to reduce the risk of a blood clot forming that can cause a stroke if you have a medical condition called atrial fibrillation (a type of irregular heartbeat).  What do You need to know about Eliquis ? Take your Eliquis TWICE DAILY - one tablet in the morning and one tablet in the evening with or without food. If you have difficulty swallowing the tablet whole please discuss with your pharmacist how to take the medication safely.  Take Eliquis exactly as prescribed by your doctor and DO NOT stop taking Eliquis without talking to the doctor who prescribed the medication.  Stopping may increase your risk of developing a stroke.  Refill your prescription before you run out.  After discharge, you should have regular check-up appointments with your healthcare provider that is prescribing your Eliquis.  In the future your dose may need to be changed if your kidney function or weight changes by a significant amount or as you get older.  What do you do if you miss a dose? If you miss a dose, take it as soon as you remember on the same day and resume taking twice daily.  Do not take more than one dose of ELIQUIS at the same time to make up a missed dose.  Important Safety Information A possible side effect of Eliquis is bleeding. You should call your healthcare provider right away if you experience any of the following: Bleeding from an injury or your nose that does not stop. Unusual colored urine (red or dark brown) or unusual colored stools (red or black). Unusual bruising for unknown reasons. A serious fall or if you hit your head (even if there is no bleeding).  Some medicines may interact with Eliquis and might increase your risk of bleeding or clotting  while on Eliquis. To help avoid this, consult your healthcare provider or pharmacist prior to using any new prescription or non-prescription medications, including herbals, vitamins, non-steroidal anti-inflammatory drugs (NSAIDs) and supplements.  This website has more information on Eliquis (apixaban): http://www.eliquis.com/eliquis/home   -======================================== Atrial Fibrillation    Atrial fibrillation is a type of heartbeat that is irregular or fast. If you have this condition, your heart beats without any order. This makes it hard for your heart to pump blood in a normal way. Atrial fibrillation may come and go, or it may become a long-lasting problem. If this condition is not treated, it can put you at higher risk for stroke, heart failure, and other heart problems.  What are the causes? This condition may be caused by diseases that damage the heart. They include: High blood pressure. Heart failure. Heart valve disease. Heart surgery. Other causes include: Diabetes. Thyroid disease. Being overweight. Kidney disease. Sometimes the cause is not known.  What increases the risk? You are more likely to develop this condition if: You are older. You smoke. You exercise often and very hard. You have a family history of this condition. You are a man. You use drugs. You drink a lot of alcohol. You have lung conditions, such as emphysema, pneumonia, or COPD. You have sleep apnea.  What are the signs or symptoms? Common symptoms of this condition include: A feeling that your heart is beating very fast. Chest pain or discomfort. Feeling short of breath. Suddenly feeling  light-headed or weak. Getting tired easily during activity. Fainting. Sweating. In some cases, there are no symptoms.  How is this treated? Treatment for this condition depends on underlying conditions and how you feel when you have atrial fibrillation. They include: Medicines  to: Prevent blood clots. Treat heart rate or heart rhythm problems. Using devices, such as a pacemaker, to correct heart rhythm problems. Doing surgery to remove the part of the heart that sends bad signals. Closing an area where clots can form in the heart (left atrial appendage). In some cases, your doctor will treat other underlying conditions.  Follow these instructions at home:  Medicines Take over-the-counter and prescription medicines only as told by your doctor. Do not take any new medicines without first talking to your doctor. If you are taking blood thinners: Talk with your doctor before you take any medicines that have aspirin or NSAIDs, such as ibuprofen, in them. Take your medicine exactly as told by your doctor. Take it at the same time each day. Avoid activities that could hurt or bruise you. Follow instructions about how to prevent falls. Wear a bracelet that says you are taking blood thinners. Or, carry a card that lists what medicines you take. Lifestyle         Do not use any products that have nicotine or tobacco in them. These include cigarettes, e-cigarettes, and chewing tobacco. If you need help quitting, ask your doctor. Eat heart-healthy foods. Talk with your doctor about the right eating plan for you. Exercise regularly as told by your doctor. Do not drink alcohol. Lose weight if you are overweight. Do not use drugs, including cannabis.  General instructions If you have a condition that causes breathing to stop for a short period of time (apnea), treat it as told by your doctor. Keep a healthy weight. Do not use diet pills unless your doctor says they are safe for you. Diet pills may make heart problems worse. Keep all follow-up visits as told by your doctor. This is important.  Contact a doctor if: You notice a change in the speed, rhythm, or strength of your heartbeat. You are taking a blood-thinning medicine and you get more bruising. You get tired  more easily when you move or exercise. You have a sudden change in weight.  Get help right away if:    You have pain in your chest or your belly (abdomen). You have trouble breathing. You have side effects of blood thinners, such as blood in your vomit, poop (stool), or pee (urine), or bleeding that cannot stop. You have any signs of a stroke. "BE FAST" is an easy way to remember the main warning signs: B - Balance. Signs are dizziness, sudden trouble walking, or loss of balance. E - Eyes. Signs are trouble seeing or a change in how you see. F - Face. Signs are sudden weakness or loss of feeling in the face, or the face or eyelid drooping on one side. A - Arms. Signs are weakness or loss of feeling in an arm. This happens suddenly and usually on one side of the body. S - Speech. Signs are sudden trouble speaking, slurred speech, or trouble understanding what people say. T - Time. Time to call emergency services. Write down what time symptoms started. You have other signs of a stroke, such as: A sudden, very bad headache with no known cause. Feeling like you may vomit (nausea). Vomiting. A seizure.  These symptoms may be an emergency. Do not wait to  see if the symptoms will go away. Get medical help right away. Call your local emergency services (911 in the [place] Do not drive yourself to the hospital. Summary Atrial fibrillation is a type of heartbeat that is irregular or fast. You are at higher risk of this condition if you smoke, are older, have diabetes, or are overweight. Follow your doctor's instructions about medicines, diet, exercise, and follow-up visits. Get help right away if you have signs or symptoms of a stroke. Get help right away if you cannot catch your breath, or you have chest pain or discomfort. This information is not intended to replace advice given to you by your health care provider. Make sure you discuss any questions you have with your health care  provider. Document Revised: 10/20/2018 Document Reviewed: 10/20/2018 Elsevier Patient Education  2020 [employer].   Jeffery Benson It was a pleasure taking care of you at [facility] were admitted for a stroke, and are going to be discharged now that you are doing better. We are discharging you home now that you are doing better. Please follow the following instructions.   1) Please call the [facility] at [phone] to make an appointment for a follow up with your neurologist. When you call let them know that you recently were in the hospital for a stroke and that you need a follow up.   2) You also have an appointment with Heart failure team on 06/12/2022 at 0930. Please show up to your appointment. Address is [address] [place], [place] [address]. Their phone number is [phone].  3) Please continue taking all of your other medications as prescribed. We did start a new medication called atorvastatin to help lower your cholesterol, please start taking this daily.  4. I have also messaged my office to ensure that you have a hoispitpal follow up with your priacy care doctor and they will reach out to you to make an appointment to come in.   Take care,  Dr. Leigh Aurora, DO

## 2022-05-30 NOTE — Discharge Summary (Incomplete)
Name: Jeffery Benson MRN: [id number] DOB: 1976-02-22 47 y.o. PCP: [name], MD  Date of Admission: 05/27/2022  3:35 PM Date of Discharge: 05/30/2022 Attending Physician: Ginnie Smart, MD  Discharge Diagnosis: 1. Principal Problem:   Acute CVA (cerebrovascular accident) (HCC) Active Problems:   HFrEF (heart failure with reduced ejection fraction) (HCC)   Diabetes mellitus (HCC)  Discharge Medications: Allergies as of 05/30/2022       Reactions   Beef Fat (tallow) [tallow]    Does not eat ground beef.   Pork-derived Products    Patient preference.     Med Rec must be completed prior to using this [facility]***       Disposition and follow-up:   Jeffery Benson was discharged from [facility] in Stable condition.  At the hospital follow up visit please address:  1.  Acute embolic stroke of left MCA Most likely cardioembolic. Started on Eliquis. Atorvastatin 40 mg. Neuro f/u outpatient.   HFrEF, NYHA II He was to establish care with heartcare 1/16, but with new symptoms was sent to ED. Did not appear volume overloaded on exam today. Saturating well on RA. TEE today with EF 20-25%, dilated LV with global hypokinesis. Cardiac MRI  with ***. Restarted GDMT. F/u scheduled with heart failure clinic 06/12/22.   Troponinemia Troponin mildly elevated at 32 to 34, then down to 31. EKG without ST elevation or t wave inversion.    Diabetes HgbA1c at 6.5%. Continued jardiance. Restart metformin at discharge   Obesity At risk for OSA BMI 51. STOP BANG elevated at 6. Underwent first part of two part Smart study sleep study  2.  Labs / imaging needed at time of follow-up: none  3.  Pending labs/ test needing follow-up: none  Follow-up Appointments:  Follow-up Information     [facility] Follow up on 06/12/2022.   Specialty: Cardiology Why: Follow up in the Advanced Heart Failure Clinic at [facility] 06/12/22 at  0930 am  Please bring all medications with you Entrance C, free valet Contact information: [address] [id number] mc [place] [address] [phone]                Hospital Course by problem list: 1. Acute Stroke of left MCA At 10:30 AM on 05/27/2022, patient was in the office and turn his head around to talk to his boss. He felt very dizzy, was having significant trouble with language and having a difficult time understanding or speaking and he also felt weak on the right side. Since then, he has noted that he has been having some trouble picking things up with his right hand. The full episode lasted 3 to 4 minutes but the trouble with picking things up with his hands is still ongoing and he feels very clumsy on the right side. Aphasia and dizziness resolved but admission, but he continued to have difficulty with fine movements of right hand and numbness though this improved during admission. MRI showed several left MCA infarcts concerning for embolic stroke. CTA neck with no stenosis. On exam there was no lower extremity erythema or swelling, his neuro exam showed no motor deficits but persistent subjective numbness to right arm. He does not have prior history of atrial fibrillation and no signs of DVT on exam. For secondary prevention, Lipid panel 1/12 with LDL of 104 and he was started on atorvastatin 40 mg. His goal will now be LDL <70. Hgb A1c  at 6.5 1/12. He is taking jardiance. Bedside swallow completed and patient passed. Started on aspirin 81 mg daily and plavix 70 mg daily for 21 days then aspirin alone. Stopped GDMT due to permissive hypertension.  TTE 1/17 without significant shunt. TEE showed no intracardiac clot. Cardiac MRI showed ***.   HFrEF Troponinemia Patient presented with chest discomfort 12/12 and Echo 12/23 showed new EF 20-25%. R/LHC showed no CAD. He was to establish care with heartcare 1/16, but with new symptoms was sent to ED. Does not  appear volume overloaded on exam this admission. Saturating well on RA. AHF consulted. TEE showed no intracardiac clot. Re-demonstrated severely reduced EF. Cardiac MRI demonstrated ***. Added back GDMT after being held for permissive HTN. Troponins peaked at 34.  Discharge Exam:   BP 110/72 (BP Location: Right Wrist)   Pulse 66   Temp 97.6 F (36.4 C) (Oral)   Resp 15   Ht 6\' 4"  (1.93 m)   Wt (!) 187 kg   SpO2 100%   BMI 50.18 kg/m  Discharge exam: Physical Exam Constitutional:      General: He is not in acute distress.    Appearance: He is obese.  HENT:     Head: Normocephalic and atraumatic.  Eyes:     Extraocular Movements: Extraocular movements intact.  Cardiovascular:     Rate and Rhythm: Normal rate and regular rhythm.  Pulmonary:     Effort: Pulmonary effort is normal.  Musculoskeletal:        General: Normal range of motion.     Cervical back: Neck supple.     Right lower leg: No edema.     Left lower leg: No edema.  Skin:    General: Skin is warm and dry.  Neurological:     Mental Status: He is alert and oriented to person, place, and time.  Psychiatric:        Mood and Affect: Mood normal.        Behavior: Behavior normal.      Pertinent Labs, Studies, and Procedures:      Latest Ref Rng & Units 05/30/2022    5:19 AM 05/29/2022    4:22 PM 05/28/2022    5:25 AM  CBC  WBC 4.0 - 10.5 K/uL 4.2  3.8  5.0   Hemoglobin 13.0 - 17.0 g/dL 15.9  15.8  16.2   Hematocrit 39.0 - 52.0 % 45.7  47.0  48.6   Platelets 150 - 400 K/uL 141  159  166       Latest Ref Rng & Units 05/30/2022    5:19 AM 05/29/2022    4:22 PM 05/28/2022    5:25 AM  CMP  Glucose 70 - 99 mg/dL 94  97  93   BUN 6 - 20 mg/dL 14  15  13    Creatinine 0.61 - 1.24 mg/dL 1.01  1.13  1.06   Sodium 135 - 145 mmol/L 136  133  135   Potassium 3.5 - 5.1 mmol/L 3.7  3.9  4.0   Chloride 98 - 111 mmol/L 103  102  99   CO2 22 - 32 mmol/L 23  22  27    Calcium 8.9 - 10.3 mg/dL 9.5  9.4  9.2      ECHOCARDIOGRAM LIMITED BUBBLE STUDY  Result Date: 05/28/2022    ECHOCARDIOGRAM LIMITED REPORT   Patient Name:   Jeffery Benson Date of Exam: 05/28/2022 Medical Rec #:  [id number]      Height:  76.0 in Accession #:    [id number]     Weight:       412.3 lb Date of Birth:  1975-06-30       BSA:          3.013 m Patient Age:    20 years       BP:           132/64 mmHg Patient Gender: M              HR:           81 bpm. Exam Location:  Inpatient Procedure: Limited Echo and Saline Contrast Bubble Study Indications:    Stroke i63.9  History:        Patient has prior history of Echocardiogram examinations, most                 recent 05/10/2022. Risk Factors:Hypertension and Sleep Apnea.  Sonographer:    Raquel Sarna Senior RDCS Referring Phys: Donnetta Simpers  Sonographer Comments: Poor visualization in apical widow, bubble performed from parasternals. IMPRESSIONS  1. Limited study for microcavitation; poor apical images; injection performed and images recorded from parasternal views with no obvious shunt noted but technically difficult. FINDINGS  IAS/Shunts: Agitated saline contrast was given intravenously to evaluate for intracardiac shunting. Additional Comments: Limited study for microcavitation; poor apical images; injection performed and images recorded from parasternal views with no obvious shunt noted but technically difficult.  Kirk Ruths MD Electronically signed by Kirk Ruths MD Signature Date/Time: 05/28/2022/3:01:28 PM    Final    CT ANGIO HEAD NECK W WO CM  Result Date: 05/28/2022 CLINICAL DATA:  Dizziness, inability to speak for several minutes; acute infarcts on 05/27/2022 MRI EXAM: CT ANGIOGRAPHY HEAD AND NECK TECHNIQUE: Multidetector CT imaging of the head and neck was performed using the standard protocol during bolus administration of intravenous contrast. Multiplanar CT image reconstructions and MIPs were obtained to evaluate the vascular anatomy. Carotid stenosis measurements (when  applicable) are obtained utilizing NASCET criteria, using the distal internal carotid diameter as the denominator. RADIATION DOSE REDUCTION: This exam was performed according to the departmental dose-optimization program which includes automated exposure control, adjustment of the mA and/or kV according to patient size and/or use of iterative reconstruction technique. CONTRAST:  48mL OMNIPAQUE IOHEXOL 350 MG/ML SOLN COMPARISON:  CT head 05/27/2022, no prior CTA FINDINGS: CT HEAD FINDINGS For noncontrast findings, please see 05/27/2022 CT head. CTA NECK FINDINGS Aortic arch: Two-vessel arch with a common origin of the brachiocephalic and left common carotid arteries. Imaged portion shows no evidence of aneurysm or dissection. No significant stenosis of the major arch vessel origins. Right carotid system: No evidence of stenosis, dissection, or occlusion. Left carotid system: No evidence of stenosis, dissection, or occlusion. Vertebral arteries: No evidence of stenosis, dissection, or occlusion. Skeleton: No acute osseous abnormality. Other neck: Negative. Upper chest: Negative. Review of the MIP images confirms the above findings CTA HEAD FINDINGS Anterior circulation: Both internal carotid arteries are patent to the termini, without significant stenosis. A1 segments patent. Normal anterior communicating artery. Anterior cerebral arteries are patent to their distal aspects. No M1 stenosis or occlusion. MCA branches perfused and symmetric. Posterior circulation: Vertebral arteries patent to the vertebrobasilar junction without stenosis. Posterior inferior cerebellar arteries patent proximally. Basilar patent to its distal aspect. Superior cerebellar arteries patent proximally. Patent P1 segments. PCAs perfused to their distal aspects without stenosis. The bilateral posterior communicating arteries are not visualized. Venous sinuses: As permitted by contrast timing, patent. Anatomic  variants: None significant. Review  of the MIP images confirms the above findings IMPRESSION: 1. No intracranial large vessel occlusion or significant stenosis. 2. No hemodynamically significant stenosis in the neck. Electronically Signed   By: Merilyn Baba M.D.   On: 05/28/2022 03:11   MR BRAIN WO CONTRAST  Result Date: 05/28/2022 CLINICAL DATA:  Stroke-like symptoms, dizziness, blurry vision, word-finding difficulty EXAM: MRI HEAD WITHOUT CONTRAST TECHNIQUE: Multiplanar, multiecho pulse sequences of the brain and surrounding structures were obtained without intravenous contrast. COMPARISON:  No prior MRI available, 05/27/2022 CT head FINDINGS: Brain: Small areas of restricted diffusion with ADC correlate in the left posterior insular cortex, subcortical Latravia Southgate matter, and posterior frontal lobe Valery Chance matter (series 2, images 29-33), and left parietal lobe cortex and subcortical Jamisen Duerson matter (series 2, images 30-37). No acute hemorrhage, mass, mass effect, or midline shift. No hydrocephalus or extra-axial collection. No hemosiderin deposition to suggest remote hemorrhage. Vascular: Normal arterial flow voids. Skull and upper cervical spine: Normal marrow signal. Sinuses/Orbits: Clear paranasal sinuses. No acute finding in the orbits. Other: The mastoids are well aerated. IMPRESSION: Small acute infarcts in the left posterior insular cortex, subcortical Teka Chanda matter, and posterior frontal lobe Atreus Hasz matter, as well as the left parietal lobe cortex and subcortical Morgaine Kimball matter. These results were called by telephone at the time of interpretation on 05/28/2022 at 12:28 am to provider [facility], who verbally acknowledged these results. Electronically Signed   By: Merilyn Baba M.D.   On: 05/28/2022 00:28   CT HEAD WO CONTRAST  Result Date: 05/27/2022 CLINICAL DATA:  Neuro deficit, acute stroke suspected. Dizziness at work. Could not speak about 3-4 months. EXAM: CT HEAD WITHOUT CONTRAST TECHNIQUE: Contiguous axial images were obtained from the base  of the skull through the vertex without intravenous contrast. RADIATION DOSE REDUCTION: This exam was performed according to the departmental dose-optimization program which includes automated exposure control, adjustment of the mA and/or kV according to patient size and/or use of iterative reconstruction technique. COMPARISON:  None Available. FINDINGS: Brain: No evidence of acute infarction, hemorrhage, hydrocephalus, extra-axial collection or mass lesion/mass effect. Vascular: No hyperdense vessel or unexpected calcification. Skull: Normal. Negative for fracture or focal lesion. Sinuses/Orbits: No acute finding. Other: None. IMPRESSION: No acute intracranial pathology. Electronically Signed   By: Keane Police D.O.   On: 05/27/2022 16:48   CARDIAC CATHETERIZATION  Result Date: [id number]   LV end diastolic pressure is mildly elevated.   Hemodynamic findings consistent with mild pulmonary hypertension. Normal coronary anatomy Mildly elevated LV filling pressures. PCWP 26/24 with mean 22 mm Hg. LVEDP 32 mm Hg Mild pulmonary HTN PAP 46/12 with mean 27 mm Hg Reduced Cardiac output 5.56 liters/min with index 1.82 Plan; optimize medical therapy for CHF.   ECHOCARDIOGRAM COMPLETE  Result Date: 05/10/2022    ECHOCARDIOGRAM REPORT   Patient Name:   Jeffery Benson Date of Exam: 05/10/2022 Medical Rec #:  [id number]      Height:       76.0 in Accession #:    [id number]     Weight:       448.0 lb Date of Birth:  1976/03/01       BSA:          3.121 m Patient Age:    45 years       BP:           145/99 mmHg Patient Gender: M              HR:  92 bpm. Exam Location:  Inpatient Procedure: 2D Echo, Cardiac Doppler, Color Doppler and Intracardiac            Opacification Agent Indications:    Congestive Heart Failure I50.9  History:        Patient has no prior history of Echocardiogram examinations.                 CHF.  Sonographer:    Bernadene Person RDCS Referring Phys: [id number] Cleaster Corin PATEL  Sonographer  Comments: Technically difficult study due to poor echo windows and patient is obese. IMPRESSIONS  1. Left ventricular ejection fraction, by estimation, is 20 to 25%. The left ventricle has severely decreased function. The left ventricle demonstrates global hypokinesis. The left ventricular internal cavity size was mildly dilated. Left ventricular diastolic parameters are consistent with Grade II diastolic dysfunction (pseudonormalization).  2. Right ventricular systolic function is normal. The right ventricular size is normal.  3. Left atrial size was severely dilated.  4. The mitral valve is normal in structure. Mild mitral valve regurgitation. No evidence of mitral stenosis.  5. The aortic valve is tricuspid. Aortic valve regurgitation is not visualized. No aortic stenosis is present.  6. The inferior vena cava is dilated in size with <50% respiratory variability, suggesting right atrial pressure of 15 mmHg. FINDINGS  Left Ventricle: Left ventricular ejection fraction, by estimation, is 20 to 25%. The left ventricle has severely decreased function. The left ventricle demonstrates global hypokinesis. Definity contrast agent was given IV to delineate the left ventricular endocardial borders. The left ventricular internal cavity size was mildly dilated. There is no left ventricular hypertrophy. Left ventricular diastolic parameters are consistent with Grade II diastolic dysfunction (pseudonormalization). Right Ventricle: The right ventricular size is normal. No increase in right ventricular wall thickness. Right ventricular systolic function is normal. Left Atrium: Left atrial size was severely dilated. Right Atrium: Right atrial size was normal in size. Pericardium: There is no evidence of pericardial effusion. Mitral Valve: The mitral valve is normal in structure. Mild mitral valve regurgitation, with posteriorly-directed jet. No evidence of mitral valve stenosis. Tricuspid Valve: The tricuspid valve is normal in  structure. Tricuspid valve regurgitation is not demonstrated. No evidence of tricuspid stenosis. Aortic Valve: The aortic valve is tricuspid. Aortic valve regurgitation is not visualized. No aortic stenosis is present. Pulmonic Valve: The pulmonic valve was normal in structure. Pulmonic valve regurgitation is not visualized. No evidence of pulmonic stenosis. Aorta: The aortic root is normal in size and structure. Venous: The inferior vena cava is dilated in size with less than 50% respiratory variability, suggesting right atrial pressure of 15 mmHg. IAS/Shunts: No atrial level shunt detected by color flow Doppler.  LEFT VENTRICLE PLAX 2D LVIDd:         6.90 cm   Diastology LVIDs:         6.20 cm   LV e' medial:    3.78 cm/s LV PW:         1.10 cm   LV E/e' medial:  21.7 LV IVS:        1.00 cm   LV e' lateral:   5.98 cm/s LVOT diam:     2.40 cm   LV E/e' lateral: 13.7 LV SV:         48 LV SV Index:   15 LVOT Area:     4.52 cm  RIGHT VENTRICLE RV S prime:     7.35 cm/s TAPSE (M-mode): 1.6 cm LEFT ATRIUM  Index        RIGHT ATRIUM           Index LA diam:        5.50 cm  1.76 cm/m   RA Area:     30.50 cm LA Vol (A2C):   177.0 ml 56.72 ml/m  RA Volume:   96.60 ml  30.95 ml/m LA Vol (A4C):   174.0 ml 55.75 ml/m LA Biplane Vol: 176.0 ml 56.39 ml/m  AORTIC VALVE LVOT Vmax:   77.13 cm/s LVOT Vmean:  48.600 cm/s LVOT VTI:    0.107 m  AORTA Ao Root diam: 3.40 cm Ao Asc diam:  3.80 cm MITRAL VALVE MV Area (PHT): 6.37 cm    SHUNTS MV Decel Time: 119 msec    Systemic VTI:  0.11 m MV E velocity: 82.00 cm/s  Systemic Diam: 2.40 cm MV A velocity: 53.80 cm/s MV E/A ratio:  1.52 Candee Furbish MD Electronically signed by Candee Furbish MD Signature Date/Time: 05/10/2022/12:07:30 PM    Final    DG Chest 2 View  Result Date: 05/09/2022 CLINICAL DATA:  Shortness of breath. EXAM: CHEST - 2 VIEW COMPARISON:  Chest radiographs 11/03/2012 FINDINGS: The cardiac silhouette is moderately enlarged which reflects a substantial  increased from 2014. There are new symmetric hazy lung opacities bilaterally. No pleural effusion or pneumothorax is identified. A right shoulder arthroplasty is partially visualized. IMPRESSION: Increased cardiomegaly with new hazy lung opacities which could reflect edema, infection, or an inflammatory process. Electronically Signed   By: Logan Bores M.D.   On: 05/09/2022 16:46     Discharge Instructions:   Signed: Linward Natal, MD 05/30/2022, 2:18 PM   Pager: [phone]

## 2022-05-30 NOTE — Anesthesia Preprocedure Evaluation (Addendum)
Anesthesia Evaluation  Patient identified by MRN, date of birth, ID band Patient awake    Reviewed: Allergy & Precautions, NPO status , Patient's Chart, lab work & pertinent test results  Airway Mallampati: II  TM Distance: >3 FB Neck ROM: Full    Dental no notable dental hx.    Pulmonary neg pulmonary ROS   Pulmonary exam normal        Cardiovascular hypertension, Pt. on home beta blockers and Pt. on medications +CHF  Normal cardiovascular exam  ECHO: 1. Left ventricular ejection fraction, by estimation, is 20 to 25%. The  left ventricle has severely decreased function. The left ventricle  demonstrates global hypokinesis. The left ventricular internal cavity size  was mildly dilated. Left ventricular  diastolic parameters are consistent with Grade II diastolic dysfunction  (pseudonormalization).   2. Right ventricular systolic function is normal. The right ventricular  size is normal.   3. Left atrial size was severely dilated.   4. The mitral valve is normal in structure. Mild mitral valve  regurgitation. No evidence of mitral stenosis.   5. The aortic valve is tricuspid. Aortic valve regurgitation is not  visualized. No aortic stenosis is present.   6. The inferior vena cava is dilated in size with <50% respiratory  variability, suggesting right atrial pressure of 15 mmHg.     Neuro/Psych CVA  negative psych ROS   GI/Hepatic negative GI ROS, Neg liver ROS,,,  Endo/Other  diabetes, Oral Hypoglycemic Agents  Morbid obesity  Renal/GU negative Renal ROS     Musculoskeletal  (+) Arthritis ,    Abdominal  (+) + obese  Peds  Hematology negative hematology ROS (+)   Anesthesia Other Findings STROKE  Reproductive/Obstetrics                             Anesthesia Physical Anesthesia Plan  ASA: 4  Anesthesia Plan: MAC   Post-op Pain Management:    Induction: Intravenous  PONV Risk  Score and Plan: 1 and Propofol infusion and Treatment may vary due to age or medical condition  Airway Management Planned: Nasal Cannula  Additional Equipment:   Intra-op Plan:   Post-operative Plan:   Informed Consent: I have reviewed the patients History and Physical, chart, labs and discussed the procedure including the risks, benefits and alternatives for the proposed anesthesia with the patient or authorized representative who has indicated his/her understanding and acceptance.     Dental advisory given  Plan Discussed with: CRNA  Anesthesia Plan Comments:         Anesthesia Quick Evaluation

## 2022-05-30 NOTE — Consult Note (Signed)
ELECTROPHYSIOLOGY CONSULT NOTE  Patient ID: SCHYLAR WUEBKER MRN: [id number], DOB/AGE: 49-Dec-1977   Admit date: 05/27/2022 Date of Consult: 05/30/2022  Primary Physician: [name], MD Primary Cardiologist: None  Primary Electrophysiologist: New to Dr. Lalla Brothers  Reason for Consultation: Cryptogenic stroke and Chronic systolic CHF; recommendations regarding Implantable Loop Recorder Insurance: [facility]  History of Present Illness EP has been asked to evaluate [name] for placement of an implantable loop recorder to monitor for atrial fibrillation by Dr Pearlean Brownie.    Patient was recently admitted 12/29-1/2 with acute systolic heart failure with NYHA IIIb symptoms. Echo EF 20-25% (New). R/LHC with normal coronaries and reduced CO.  Diuresed with IV lasix and discharged on GDMT   The patient was admitted from HF TOC on 05/27/2022 when he presented to OV with sudden onset dizziness, difficulty speaking and R arm numbness.    Imaging demonstrated patchy left MCA territory infarcts.    He has undergone workup for stroke including:  Stroke:  Patchy left MCA territory infarcts  Etiology:  likely cardioembolic  Code Stroke CT head No acute abnormality. ASPECTS 10.    CTA head & neck No LVO MRI  Small acute infarcts in the left posterior insular cortex, subcortical white matter, and posterior frontal lobe white matter, as well as the left parietal lobe cortex and subcortical white matter.  2D Echo EF 20-25% LA severe dilation  LDL 104 HgbA1c 6.5 VTE prophylaxis - Lovenox  No antithrombotic prior to admission, now on aspirin 81 mg daily and clopidogrel 75 mg daily.x 3 weeks and then aspirin alone Therapy recommendations:  No follow up    The patient has been monitored on telemetry which has demonstrated sinus rhythm with NSVT but no atrial arrhythmias.  Inpatient stroke work-up will require a TEE per Neurology.   Echo 04/2022: LVEF 20-25%, LV with global HK, Grade 2 DD, Normal RV, Severe  LAE, mild MR, no TR  [facility] 05/13/22: No CADs, mildly elevated LV filling pressures. PCWP 26/24 (22) LVEDP 32 mm Hg, Mild pulmonary HTN PAP 46/12 (27),CO/CI 5.56 1.82  Limited Echo 05/28/22: showed no shunt  TEE planned for this morning.   Pt feeling OK this am. Denies CP or SOB. No history of syncope. No known history of AF. He understands he may be candidate for loop recorder pending TEE, and he may eventually require ICD. Neuro deficits mostly resolved.    Medications Prior to Admission  Medication Sig Dispense Refill Last Dose   carvedilol (COREG) 3.125 MG tablet Take 1 tablet (3.125 mg total) by mouth 2 (two) times daily with a meal. 60 tablet 3 05/27/2022   empagliflozin (JARDIANCE) 10 MG TABS tablet Take 1 tablet (10 mg total) by mouth daily. 30 tablet 3 05/27/2022   furosemide (LASIX) 40 MG tablet Take 1 tablet (40 mg total) by mouth daily. 30 tablet 3 05/27/2022   metFORMIN (GLUCOPHAGE-XR) 500 MG 24 hr tablet Take 500 mg by mouth daily with breakfast.      potassium chloride SA (KLOR-CON M) 20 MEQ tablet Take 1 tablet (20 mEq total) by mouth daily. 30 tablet 3 05/27/2022   sacubitril-valsartan (ENTRESTO) 24-26 MG Take 1 tablet by mouth 2 (two) times daily. 60 tablet 3 05/27/2022   spironolactone (ALDACTONE) 25 MG tablet Take 1/2 tablet (12.5 mg total) by mouth daily. 30 tablet 3 05/27/2022    Inpatient Medications:   apixaban  5 mg Oral BID   atorvastatin  40 mg Oral Daily   carvedilol  3.125  mg Oral BID WC   empagliflozin  10 mg Oral Daily   furosemide  40 mg Oral Daily   insulin aspart  0-5 Units Subcutaneous QHS   insulin aspart  0-9 Units Subcutaneous TID WC   sacubitril-valsartan  1 tablet Oral BID   spironolactone  12.5 mg Oral Daily    Allergies, Past Medical, Surgical, Social, and Family Histories have been reviewed and are referenced here-in when relevant for medical decision making.   Physical Exam: Vitals:   05/29/22 1600 05/29/22 1827 05/30/22 0800 05/30/22 1014  BP:  120/83 126/71 117/66 127/78  Pulse: 76 65 67 65  Resp: 11 14  15   Temp:  98.1 F (36.7 C) 98 F (36.7 C) 97.6 F (36.4 C)  TempSrc:  Oral Oral Temporal  SpO2: 96%  96% 98%  Weight:      Height:        GEN- The patient is well appearing, alert and oriented x 3 today.   Head- normocephalic, atraumatic Eyes-  Sclera clear, conjunctiva pink Ears- hearing intact Oropharynx- clear Neck- supple Lungs- Clear to ausculation bilaterally, normal work of breathing Heart- Regular rate and rhythm, no murmurs, rubs or gallops  GI- soft, NT, ND, + BS Extremities- no clubbing, cyanosis, or edema MS- no significant deformity or atrophy Skin- no rash or lesion Psych- euthymic mood, full affect Neuro- No current focal abnormalities.   Labs:   Lab Results  Component Value Date   WBC 4.2 05/30/2022   HGB 15.9 05/30/2022   HCT 45.7 05/30/2022   MCV 82.6 05/30/2022   PLT 141 (L) 05/30/2022    Recent Labs  Lab 05/27/22 1545 05/27/22 1636 05/30/22 0519  NA 132*   < > 136  K 4.0   < > 3.7  CL 98   < > 103  CO2 23   < > 23  BUN 15   < > 14  CREATININE 1.03   < > 1.01  CALCIUM 9.4   < > 9.5  PROT 7.2  --   --   BILITOT 0.8  --   --   ALKPHOS 52  --   --   ALT 37  --   --   AST 32  --   --   GLUCOSE 91   < > 94   < > = values in this interval not displayed.   12-lead ECG On arrival showed NSR at 81 bpm (personally reviewed) All prior EKG's in EPIC reviewed with no documented atrial fibrillation  Telemetry NSR 60-70s, NSVT noted (personally reviewed)  Assessment and Plan:  1. Cryptogenic stroke The patient presents with cryptogenic stroke.  The patient does have a TEE planned for this AM.  I spoke at length with the patient about monitoring for afib with an implantable loop recorder.  Risks, benefits, and alteratives to implantable loop recorder were discussed with the patient today.   At this time, the patient is very clear in their decision to proceed with implantable loop  recorder pending TEE and cMRI results  2. Chronic systolic CHF / NICM 3. NSVT Fairly recent diagnosis Pt will need ICD consideration in 3-4 months if EF not improved on GDMT. If decision made to anticoagulate, would defer loop and re-evaluate pending Repeat Echo down the road Consider cMRI given young age with NICM and ventricular ectopy.  Possible 2/2 poorly controlled HTN. If concerning for sarcoid, would need to consider primary prevention ICD sooner, potentially this admission.   Dr. Quentin Ore has  seen. We have confirmed cMRI availability for today. They will assess his body habitus to confirm can proceed.   Shirley Friar, PA-C 05/30/2022 10:43 AM

## 2022-05-30 NOTE — Progress Notes (Signed)
  Echocardiogram Echocardiogram Transesophageal has been performed.  Darlina Sicilian M 05/30/2022, 12:08 PM

## 2022-05-30 NOTE — Progress Notes (Signed)
HD#0 Subjective:   Summary: [name] is a 47 year- old person with PMH of HFrEF EF 20-25%, HTN, and obesity who presented with sudden onset dizziness, difficulty speaking and right arm numbness.   Overnight Events: NAEO.  Patient states he feels well today. Denies chest pain, shortness of breath, swelling. Reports having just seen heart failure team who is planning for TEE this morning.   Objective:  Vital signs in last 24 hours: Vitals:   05/30/22 1200 05/30/22 1215 05/30/22 1230 05/30/22 1303  BP: 120/88 135/77 138/77 110/72  Pulse: 65 71 70 66  Resp: (!) 22 (!) 25 15   Temp:   98 F (36.7 C) 97.6 F (36.4 C)  TempSrc:    Oral  SpO2: 95% 97% 97% 100%  Weight:      Height:       Supplemental O2: Room Air SpO2: 100 % O2 Flow Rate (L/min): 2 L/min   Physical Exam:  Constitutional: obese male, sitting in hospital bed, in no acute distress HENT: normocephalic atraumatic, mucous membranes moist, PERRL Eyes: conjunctiva non-erythematous Neck: supple Cardiovascular: distant heart sounds, regular rate and rhythm, no lower extremity edema Pulmonary/Chest: normal work of breathing on room air MSK: normal bulk and tone Neurological: alert & oriented x 3, cranial nerves intact, non-dysarthric, 5/5 strength in bilateral upper extremities, sensation normal,  Skin: warm and dry Psych: Normal mood and affect  Filed Weights   05/27/22 1539  Weight: (!) 187 kg     Intake/Output Summary (Last 24 hours) at 05/30/2022 1414 Last data filed at 05/30/2022 1230 Gross per 24 hour  Intake 265 ml  Output --  Net 265 ml    Net IO Since Admission: -382 mL [05/30/22 1414]  Pertinent Labs:    Latest Ref Rng & Units 05/30/2022    5:19 AM 05/29/2022    4:22 PM 05/28/2022    5:25 AM  CBC  WBC 4.0 - 10.5 K/uL 4.2  3.8  5.0   Hemoglobin 13.0 - 17.0 g/dL 15.9  15.8  16.2   Hematocrit 39.0 - 52.0 % 45.7  47.0  48.6   Platelets 150 - 400 K/uL 141  159  166        Latest Ref Rng &  Units 05/30/2022    5:19 AM 05/29/2022    4:22 PM 05/28/2022    5:25 AM  CMP  Glucose 70 - 99 mg/dL 94  97  93   BUN 6 - 20 mg/dL 14  15  13    Creatinine 0.61 - 1.24 mg/dL 1.01  1.13  1.06   Sodium 135 - 145 mmol/L 136  133  135   Potassium 3.5 - 5.1 mmol/L 3.7  3.9  4.0   Chloride 98 - 111 mmol/L 103  102  99   CO2 22 - 32 mmol/L 23  22  27    Calcium 8.9 - 10.3 mg/dL 9.5  9.4  9.2     Imaging: No results found.  Assessment/Plan:   Principal Problem:   Acute CVA (cerebrovascular accident) ([place]) Active Problems:   HFrEF (heart failure with reduced ejection fraction) ([place])   Diabetes mellitus ([place])   Patient Summary: [name] is a 47 year- old person with PMH of HFrEF EF 20-25%, HTN, and obesity who presented with sudden onset dizziness, difficulty speaking and right arm numbness admitted for acute embolic left MCA stroke.  Acute embolic stroke of left MCA Patient presented with sudden onset expressive aphasia, dizziness, and right arm  numbness/ dexterity deficits. MRI showed several left MCA infarcts concerning for embolic stroke. CTA neck with no stenosis. He does not have prior history of atrial fibrillation. Reports resolution of deficits. Speech, strength and sensation intact on exam. TEE today without intracardiac clot. Cardiac MRI pending. -started Eliquis today -frequent neuro checks -TTE yesterday without significant shunt but needs  -continue tele monitoring, cardiac heart monitoring at discharge -continue atorvastatin 40 mg and jardiance 10 mg -PT/OT do not recommend follow up -Na restricted diet -lovenox for DVT ppx   HFrEF, NYHA II Patient presented with chest discomfort 12/12 and Echo 12/23 showed new EF 20-25%. R/LHC showed no CAD. He was to establish care with heartcare 1/16, but with new symptoms was sent to ED. Does not appear volume overloaded on exam today. Saturating well on RA. TEE today with EF 20-25%, dilated LV with global hypokinesis. Cardiac MRI  pending. -heart failure consulted, appreciate assistance -continue spironolactone, lasix -holding entresto, carvedilol -Continue jardiance -strict I&O, daily weights   Troponinemia Troponin mildly elevated at 32 to 34, then down to 31. EKG without ST elevation or t wave inversion.    Diabetes HgbA1c at 6.5%. Medications include jardiance and metformin (started 1/16 and he has not picked up yet). Fasting glucose 94. -SSI -continue jardiance -restart metformin at discharge   Obesity At risk for OSA BMI 51. STOP BANG elevated at 6. -underwent first part of two part Smart study sleep study   Best Practice: Diet: low Na VTE: Lovenox Code: Full Code Dispo: Observation with expected length of stay less than 2 midnights. Anticipated Discharge Location: Home Barriers to Discharge:  evaluation with therapies and work-up for embolic source   Dispo: Admit patient to Observation with expected length of stay less than 2 midnights.  Linward Natal MD Internal Medicine Resident PGY-1 Please contact the on call pager after 5 pm and on weekends at [phone].

## 2022-05-31 DIAGNOSIS — I639 Cerebral infarction, unspecified: Secondary | ICD-10-CM | POA: Diagnosis not present

## 2022-05-31 LAB — BASIC METABOLIC PANEL
Anion gap: 9 (ref 5–15)
BUN: 10 mg/dL (ref 6–20)
CO2: 24 mmol/L (ref 22–32)
Calcium: 9.6 mg/dL (ref 8.9–10.3)
Chloride: 103 mmol/L (ref 98–111)
Creatinine, Ser: 0.9 mg/dL (ref 0.61–1.24)
GFR, Estimated: >60 mL/min (ref >60)
Glucose, Bld: 92 mg/dL (ref 70–99)
Potassium: 3.7 mmol/L (ref 3.5–5.1)
Sodium: 136 mmol/L (ref 135–145)

## 2022-05-31 LAB — CBC
HCT: 47.5 % (ref 39.0–52.0)
Hemoglobin: 16.3 g/dL (ref 13.0–17.0)
MCH: 28.2 pg (ref 26.0–34.0)
MCHC: 34.3 g/dL (ref 30.0–36.0)
MCV: 82.3 fL (ref 80.0–100.0)
Platelets: 152 10*3/uL (ref 150–400)
RBC: 5.77 MIL/uL (ref 4.22–5.81)
RDW: 13.2 % (ref 11.5–15.5)
WBC: 4.8 10*3/uL (ref 4.0–10.5)
nRBC: 0 % (ref 0.0–0.2)

## 2022-05-31 LAB — GLUCOSE, CAPILLARY: Glucose-Capillary: 95 mg/dL (ref 70–99)

## 2022-05-31 LAB — ECHO TEE

## 2022-05-31 MED ORDER — ASPIRIN 81 MG PO TBEC: 81.0000 mg | DELAYED_RELEASE_TABLET | Freq: Every day | ORAL | 2 refills | Status: DC

## 2022-05-31 MED ORDER — CLOPIDOGREL BISULFATE 75 MG PO TABS: 75.0000 mg | ORAL_TABLET | Freq: Every day | ORAL | 0 refills | Status: DC

## 2022-05-31 MED ORDER — ATORVASTATIN CALCIUM 40 MG PO TABS: 40.0000 mg | ORAL_TABLET | Freq: Every day | ORAL | 0 refills | Status: DC

## 2022-05-31 MED ORDER — APIXABAN 5 MG PO TABS: 5.0000 mg | ORAL_TABLET | Freq: Two times a day (BID) | ORAL | 0 refills | Status: DC

## 2022-05-31 NOTE — Anesthesia Postprocedure Evaluation (Signed)
Anesthesia Post Note  Patient: JADARRIUS MASELLI  Procedure(s) Performed: TRANSESOPHAGEAL ECHOCARDIOGRAM (TEE) BUBBLE STUDY     Patient location during evaluation: Endoscopy Anesthesia Type: MAC Level of consciousness: awake Pain management: pain level controlled Vital Signs Assessment: post-procedure vital signs reviewed and stable Respiratory status: spontaneous breathing, nonlabored ventilation and respiratory function stable Cardiovascular status: blood pressure returned to baseline and stable Postop Assessment: no apparent nausea or vomiting Anesthetic complications: no   No notable events documented.  Last Vitals:  Vitals:   05/30/22 2045 05/31/22 0434  BP: (!) 141/86 122/78  Pulse: 74 67  Resp: 18 16  Temp: 36.6 C 36.7 C  SpO2: 100% 98%    Last Pain:  Vitals:   05/31/22 0434  TempSrc: Oral  PainSc:                  Karyl Kinnier Raza Bayless

## 2022-05-31 NOTE — Discharge Summary (Addendum)
Name: Jeffery Benson MRN: [id number] DOB: [birth year] 47 y.o. PCP: Jeffery Abbot, MD  Date of Admission: 05/27/2022  3:35 PM Date of Discharge:  05/31/2022 Attending Physician: Dr.  Heide Spark  DISCHARGE DIAGNOSIS:  Primary Problem: Acute CVA (cerebrovascular accident) [facility])   Hospital Problems: Principal Problem:   Acute CVA (cerebrovascular accident) [facility]) Active Problems:   HFrEF (heart failure with reduced ejection fraction) (HCC)   Diabetes mellitus (HCC)    DISCHARGE MEDICATIONS:   Allergies as of 05/31/2022       Reactions   Beef Fat (tallow) [tallow]    Does not eat ground beef.   Pork-derived Products    Patient preference.        Medication List     TAKE these medications    apixaban 5 MG Tabs tablet Commonly known as: ELIQUIS Take 1 tablet (5 mg total) by mouth 2 (two) times daily.   atorvastatin 40 MG tablet Commonly known as: LIPITOR Take 1 tablet (40 mg total) by mouth daily.   carvedilol 3.125 MG tablet Commonly known as: COREG Take 1 tablet (3.125 mg total) by mouth 2 (two) times daily with a meal.   Entresto 24-26 MG Generic drug: sacubitril-valsartan Take 1 tablet by mouth 2 (two) times daily.   furosemide 40 MG tablet Commonly known as: LASIX Take 1 tablet (40 mg total) by mouth daily.   Jardiance 10 MG Tabs tablet Generic drug: empagliflozin Take 1 tablet (10 mg total) by mouth daily.   metFORMIN 500 MG 24 hr tablet Commonly known as: GLUCOPHAGE-XR Take 500 mg by mouth daily with breakfast.   potassium chloride SA 20 MEQ tablet Commonly known as: KLOR-CON M Take 1 tablet (20 mEq total) by mouth daily.   spironolactone 25 MG tablet Commonly known as: ALDACTONE Take 1/2 tablet (12.5 mg total) by mouth daily.        DISPOSITION AND FOLLOW-UP:  Jeffery Benson was discharged from [facility] in Stable condition. At the hospital follow up visit please address:  Acute CVA of left MCA Optimize stroke risk  factors including diabetes, blood pressure, cholesterol. Ensure patient is taking all medications.  HFrEF, NYHA II Troponinemia Check BMP and adjust potassium supplementation if necessary. Ensure patient has followed up with cardiology/heart failure teams.  Follow-up Recommendations: Consults: Cardiology, Neurology Labs: Basic Metabolic Profile Studies: None Medications:   START taking: Atorvastatin 40 mg daily Eliquis 5 mg BID  CONTINUE taking: Carvedilol 3.125 mg BID Lasix 40 mg daily Entresto 24-26 mg BID Spironolactone 25 mg daily Jardiance 10 mg daily Metformin 500 mg daily Potassium 20 mEq daily  Follow-up Appointments:  Follow-up Information     [facility] Follow up on 06/12/2022.   Specialty: Cardiology Why: Follow up in the Advanced Heart Failure Clinic at [facility] 06/12/22 at 0930 am  Please bring all medications with you Entrance C, free valet Contact information: [address] [phone] mc Jeffery Benson [address] [phone]        Jeffery Abbot, MD Follow up.   Specialty: Internal Medicine Why: The clinic will call you to schedule a follow-up appointment. Contact information: [address] [place] [address] [phone]                 HOSPITAL COURSE:  Patient Summary: Acute CVA of left MCA Patient presented after acute onset of difficulty with speech, R arm numbness, and difficulty with his balance. CT head was negative for acute intracranial  pathology. MRI brain showed small acute infarcts of the L posterior insular cortex, white matter, and posterior frontal lobe white matter, as well as the L parietal lobe cortex and subcortical white matter. CTA head/neck showed no intracranial large vessel occlusion or significant stenosis, and no hemodynamically significant stenosis in the neck. He was hemodynamically stable. On admission he was allowed permissive hypertension.  Lipid profile was checked recently with LDL above goal at 104. HbA1c also checked recently and was 6.5%. TTE and TEE did not show source of cardioembolic stroke. At time of discharge, patient had no residual deficits.  HFrEF, NYHA II Troponinemia HFrEF with LF EV 20-25% diagnosed in 04/2022; R/LHC with no CAD at that time. He is on GDMT however this was held initially on admission to allow for permissive hypertension. Troponins initially elevated with peak of 34, thought to be troponin leak as there were no concerning EKG findings. TTE did not show source of cardioembolic stroke. TEE showed LV EF 20-25% with dilation, global hypokinesis, and no clot; LV with mild hypokinesis; moderately dilated LA with no thrombus; trivial mitral and tricuspid regurgitation; no PFO or ASD. Cardiac MRI did not show evidence of sarcoidosis. At time of discharge he has resumed GDMT.   DISCHARGE INSTRUCTIONS:   Discharge Instructions     (HEART FAILURE PATIENTS) Call MD:  Anytime you have any of the following symptoms: 1) 3 pound weight gain in 24 hours or 5 pounds in 1 week 2) shortness of breath, with or without a dry hacking cough 3) swelling in the hands, feet or stomach 4) if you have to sleep on extra pillows at night in order to breathe.   Complete by: As directed    (HEART FAILURE PATIENTS) Call MD:  Anytime you have any of the following symptoms: 1) 3 pound weight gain in 24 hours or 5 pounds in 1 week 2) shortness of breath, with or without a dry hacking cough 3) swelling in the hands, feet or stomach 4) if you have to sleep on extra pillows at night in order to breathe.   Complete by: As directed    Diet - low sodium heart healthy   Complete by: As directed    Discharge instructions   Complete by: As directed    Jeffery Benson,  It was a pleasure to care for you during your stay at [facility].  When you go home please note the following medications that you should take daily: 1. START  taking:  a. Atorvastatin 40 mg daily  b. Eliquis 5 mg BID  c. Aspirin 81 mg daily  d. Plavix 75 mg daily for 3 weeks 2. CONTINUE taking:  a. Carvedilol 3.125 mg BID  b. Lasix 40 mg daily  c. Entresto 24-26 mg BID  d. Spironolactone 25 mg daily  e. Jardiance 10 mg daily  f. Metformin 500 mg daily  g. Potassium 20 mEq daily  Our clinic will contact you to schedule a follow-up. You have a follow-up schedule with cardiology on February 1 at 9:30 am.  My best, Dr. August Saucer   Discharge instructions   Complete by: As directed    Mr. Peddie,   It was a pleasure to care for you during your stay at [facility].   When you go home please note the following medications that you should take daily:  1. START taking:  a. Atorvastatin 40 mg daily  b. Eliquis 5 mg BID   2. CONTINUE taking:  a. Carvedilol 3.125 mg  BID  b. Lasix 40 mg daily  c. Entresto 24-26 mg BID  d. Spironolactone 25 mg daily  e. Jardiance 10 mg daily  f. Metformin 500 mg daily  g. Potassium 20 mEq daily   Our clinic will contact you to schedule a follow-up. You have a follow-up schedule with cardiology on February 1 at 9:30 am.   My best,  Dr. Marlou Sa   Increase activity slowly   Complete by: As directed        SUBJECTIVE:  Patient evaluated at bedside on day of discharge. He is feeling well, has not had recurrence of neurological symptoms. He is eager to go home.  Discharge Vitals:   BP 122/78 (BP Location: Right Arm)   Pulse 67   Temp 98 F (36.7 C) (Oral)   Resp 16   Ht 6\' 4"  (1.93 m)   Wt (!) 187 kg   SpO2 98%   BMI 50.18 kg/m   OBJECTIVE:  Physical Exam Constitutional:      General: He is not in acute distress.    Appearance: He is obese.  Cardiovascular:     Rate and Rhythm: Normal rate and regular rhythm.  Pulmonary:     Effort: Pulmonary effort is normal.     Breath sounds: Normal breath sounds.  Musculoskeletal:     Right lower leg: No edema.     Left lower leg: No edema.  Skin:     General: Skin is warm and dry.  Neurological:     General: No focal deficit present.     Mental Status: He is alert and oriented to person, place, and time.     Pertinent Labs, Studies, and Procedures:     Latest Ref Rng & Units 05/31/2022    4:32 AM 05/30/2022    5:19 AM 05/29/2022    4:22 PM  CBC  WBC 4.0 - 10.5 K/uL 4.8  4.2  3.8   Hemoglobin 13.0 - 17.0 g/dL 16.3  15.9  15.8   Hematocrit 39.0 - 52.0 % 47.5  45.7  47.0   Platelets 150 - 400 K/uL 152  141  159        Latest Ref Rng & Units 05/31/2022    4:32 AM 05/30/2022    5:19 AM 05/29/2022    4:22 PM  CMP  Glucose 70 - 99 mg/dL 92  94  97   BUN 6 - 20 mg/dL 10  14  15    Creatinine 0.61 - 1.24 mg/dL 0.90  1.01  1.13   Sodium 135 - 145 mmol/L 136  136  133   Potassium 3.5 - 5.1 mmol/L 3.7  3.7  3.9   Chloride 98 - 111 mmol/L 103  103  102   CO2 22 - 32 mmol/L 24  23  22    Calcium 8.9 - 10.3 mg/dL 9.6  9.5  9.4     ECHOCARDIOGRAM LIMITED BUBBLE STUDY  Result Date: 05/28/2022    ECHOCARDIOGRAM LIMITED REPORT   Patient Name:   RIEN MARLAND Date of Exam: 05/28/2022 Medical Rec #:  [id number]      Height:       76.0 in Accession #:    [id number]     Weight:       412.3 lb Date of Birth:  1975-08-30       BSA:          3.013 m Patient Age:    55 years       BP:  132/64 mmHg Patient Gender: M              HR:           81 bpm. Exam Location:  Inpatient Procedure: Limited Echo and Saline Contrast Bubble Study Indications:    Stroke i63.9  History:        Patient has prior history of Echocardiogram examinations, most                 recent 05/10/2022. Risk Factors:Hypertension and Sleep Apnea.  Sonographer:    Irving Burton Senior RDCS Referring Phys: Erick Blinks  Sonographer Comments: Poor visualization in apical widow, bubble performed from parasternals. IMPRESSIONS  1. Limited study for microcavitation; poor apical images; injection performed and images recorded from parasternal views with no obvious shunt noted but technically  difficult. FINDINGS  IAS/Shunts: Agitated saline contrast was given intravenously to evaluate for intracardiac shunting. Additional Comments: Limited study for microcavitation; poor apical images; injection performed and images recorded from parasternal views with no obvious shunt noted but technically difficult.  Olga Millers MD Electronically signed by Olga Millers MD Signature Date/Time: 05/28/2022/3:01:28 PM    Final    CT ANGIO HEAD NECK W WO CM  Result Date: 05/28/2022 CLINICAL DATA:  Dizziness, inability to speak for several minutes; acute infarcts on 05/27/2022 MRI EXAM: CT ANGIOGRAPHY HEAD AND NECK TECHNIQUE: Multidetector CT imaging of the head and neck was performed using the standard protocol during bolus administration of intravenous contrast. Multiplanar CT image reconstructions and MIPs were obtained to evaluate the vascular anatomy. Carotid stenosis measurements (when applicable) are obtained utilizing NASCET criteria, using the distal internal carotid diameter as the denominator. RADIATION DOSE REDUCTION: This exam was performed according to the departmental dose-optimization program which includes automated exposure control, adjustment of the mA and/or kV according to patient size and/or use of iterative reconstruction technique. CONTRAST:  19mL OMNIPAQUE IOHEXOL 350 MG/ML SOLN COMPARISON:  CT head 05/27/2022, no prior CTA FINDINGS: CT HEAD FINDINGS For noncontrast findings, please see 05/27/2022 CT head. CTA NECK FINDINGS Aortic arch: Two-vessel arch with a common origin of the brachiocephalic and left common carotid arteries. Imaged portion shows no evidence of aneurysm or dissection. No significant stenosis of the major arch vessel origins. Right carotid system: No evidence of stenosis, dissection, or occlusion. Left carotid system: No evidence of stenosis, dissection, or occlusion. Vertebral arteries: No evidence of stenosis, dissection, or occlusion. Skeleton: No acute osseous  abnormality. Other neck: Negative. Upper chest: Negative. Review of the MIP images confirms the above findings CTA HEAD FINDINGS Anterior circulation: Both internal carotid arteries are patent to the termini, without significant stenosis. A1 segments patent. Normal anterior communicating artery. Anterior cerebral arteries are patent to their distal aspects. No M1 stenosis or occlusion. MCA branches perfused and symmetric. Posterior circulation: Vertebral arteries patent to the vertebrobasilar junction without stenosis. Posterior inferior cerebellar arteries patent proximally. Basilar patent to its distal aspect. Superior cerebellar arteries patent proximally. Patent P1 segments. PCAs perfused to their distal aspects without stenosis. The bilateral posterior communicating arteries are not visualized. Venous sinuses: As permitted by contrast timing, patent. Anatomic variants: None significant. Review of the MIP images confirms the above findings IMPRESSION: 1. No intracranial large vessel occlusion or significant stenosis. 2. No hemodynamically significant stenosis in the neck. Electronically Signed   By: Wiliam Ke M.D.   On: 05/28/2022 03:11   MR BRAIN WO CONTRAST  Result Date: 05/28/2022 CLINICAL DATA:  Stroke-like symptoms, dizziness, blurry vision, word-finding difficulty EXAM: MRI HEAD  WITHOUT CONTRAST TECHNIQUE: Multiplanar, multiecho pulse sequences of the brain and surrounding structures were obtained without intravenous contrast. COMPARISON:  No prior MRI available, 05/27/2022 CT head FINDINGS: Brain: Small areas of restricted diffusion with ADC correlate in the left posterior insular cortex, subcortical white matter, and posterior frontal lobe white matter (series 2, images 29-33), and left parietal lobe cortex and subcortical white matter (series 2, images 30-37). No acute hemorrhage, mass, mass effect, or midline shift. No hydrocephalus or extra-axial collection. No hemosiderin deposition to  suggest remote hemorrhage. Vascular: Normal arterial flow voids. Skull and upper cervical spine: Normal marrow signal. Sinuses/Orbits: Clear paranasal sinuses. No acute finding in the orbits. Other: The mastoids are well aerated. IMPRESSION: Small acute infarcts in the left posterior insular cortex, subcortical white matter, and posterior frontal lobe white matter, as well as the left parietal lobe cortex and subcortical white matter. These results were called by telephone at the time of interpretation on 05/28/2022 at 12:28 am to provider [facility], who verbally acknowledged these results. Electronically Signed   By: Merilyn Baba M.D.   On: 05/28/2022 00:28   CT HEAD WO CONTRAST  Result Date: 05/27/2022 CLINICAL DATA:  Neuro deficit, acute stroke suspected. Dizziness at work. Could not speak about 3-4 months. EXAM: CT HEAD WITHOUT CONTRAST TECHNIQUE: Contiguous axial images were obtained from the base of the skull through the vertex without intravenous contrast. RADIATION DOSE REDUCTION: This exam was performed according to the departmental dose-optimization program which includes automated exposure control, adjustment of the mA and/or kV according to patient size and/or use of iterative reconstruction technique. COMPARISON:  None Available. FINDINGS: Brain: No evidence of acute infarction, hemorrhage, hydrocephalus, extra-axial collection or mass lesion/mass effect. Vascular: No hyperdense vessel or unexpected calcification. Skull: Normal. Negative for fracture or focal lesion. Sinuses/Orbits: No acute finding. Other: None. IMPRESSION: No acute intracranial pathology. Electronically Signed   By: Keane Police D.O.   On: 05/27/2022 16:48     Signed: Farrel Gordon, D.O.  Internal Medicine Resident, PGY-2 [facility]  Pager: [phone]

## 2022-05-31 NOTE — Progress Notes (Signed)
Pt AVS reviewed and pt verbalized understanding of all DC teaching and instructions. Pt going home with wife as transportation. IV removed without complications and pt satisfied with personal belongings  being in his possession.

## 2022-06-01 ENCOUNTER — Encounter (HOSPITAL_COMMUNITY): Payer: Self-pay | Admitting: Internal Medicine

## 2022-06-05 ENCOUNTER — Other Ambulatory Visit (HOSPITAL_COMMUNITY): Payer: Self-pay

## 2022-06-11 NOTE — Progress Notes (Signed)
PCP: Primary Cardiologist:  HPI:  Jeffery Benson is a 47 y.o. male with systolic heart failure (diagnosed 12/23), HTN, HLD, and obesity.    He was recently admitted 12/29-1/2 with acute systolic heart failure.  He could not walk 5 feet without getting short of breath at the time.  Echo EF 20-25%. Diuresed well with IV lasix. Discharged on GDMT and scheduled for HF TOC appt.    Admitted 05/27/22 with stroke. MRI brain d patchy left MCA territory infarcts.  Neurology consulted. Suspected cardioembolic. TEE EF 25% ,, no clot.   Sleep study in the hospital + for OSA.  Sarted back on GDMT.    Today he returns for post hospital follow up. Overall feeling fine. Denies SOB/PND/Orthopnea. Appetite ok. No fever or chills. Weight at home  pounds. Taking all medications    Cardiac studies reviewed 05/30/2022 TEE- No clot EF 25%  05/28/22: bubble study showed no shunt 12/23: EF 20-25%, LV with global hypokinesis, GIIDD, RV function normal, LA severly dilated, mild MR, no TR [facility] 05/13/22: no CAD, normal coronary anatomy, Mildly elevated LV filling pressures. PCWP 26/24 with mean 22 mm Hg. LVEDP 32 mm Hg, Mild pulmonary HTN PAP 46/12 with mean 27 mm Hg, Reduced Cardiac output 5.56 liters/min with index 1.82     ROS: All systems negative except as listed in HPI, PMH and Problem List.  SH:  Social History   Socioeconomic History   Marital status: Married    Spouse name: [name]   Number of children: 2   Years of education: Not on file   Highest education level: Master's degree (e.g., MA, MS, MEng, MEd, MSW, MBA)  Occupational History   Occupation: Development worker, community  Tobacco Use   Smoking status: Never   Smokeless tobacco: Never  Vaping Use   Vaping Use: Never used  Substance and Sexual Activity   Alcohol use: Not on file    Comment: rare   Drug use: No   Sexual activity: Yes  Other Topics Concern   Not on file  Social History Narrative   Not on file   Social Determinants of Health   Financial  Resource Strain: Low Risk  (05/13/2022)   Overall Financial Resource Strain (CARDIA)    Difficulty of Paying Living Expenses: Not very hard  Food Insecurity: No Food Insecurity (05/23/2022)   Hunger Vital Sign    Worried About Running Out of Food in the Last Year: Never true    Ran Out of Food in the Last Year: Never true  Transportation Needs: No Transportation Needs (05/23/2022)   PRAPARE - Hydrologist (Medical): No    Lack of Transportation (Non-Medical): No  Physical Activity: Not on file  Stress: Not on file  Social Connections: Unknown (05/23/2022)   Social Connection and Isolation Panel [NHANES]    Frequency of Communication with Friends and Family: More than three times a week    Frequency of Social Gatherings with Friends and Family: More than three times a week    Attends Religious Services: Not on Diplomatic Services operational officer of Clubs or Organizations: No    Attends Archivist Meetings: Never    Marital Status: Married  Human resources officer Violence: Not At Risk (05/23/2022)   Humiliation, Afraid, Rape, and Kick questionnaire    Fear of Current or Ex-Partner: No    Emotionally Abused: No    Physically Abused: No    Sexually Abused: No    FH:  Family History  Problem Relation Age of Onset   Cancer Mother        bladder & ovarian   Diabetes Mother    Cancer Father        colon   Depression Maternal Aunt    Depression Maternal Grandmother    Alcohol abuse Paternal Grandfather     Past Medical History:  Diagnosis Date   History of chickenpox    Hypertension    Myocardial injury 05/09/2022   Numbness and tingling of foot 10/31/2011   Rupture of right quadriceps tendon 08/14/2016    Current Outpatient Medications  Medication Sig Dispense Refill   apixaban (ELIQUIS) 5 MG TABS tablet Take 1 tablet (5 mg total) by mouth 2 (two) times daily. 60 tablet 0   atorvastatin (LIPITOR) 40 MG tablet Take 1 tablet (40 mg total) by mouth daily. 30  tablet 0   carvedilol (COREG) 3.125 MG tablet Take 1 tablet (3.125 mg total) by mouth 2 (two) times daily with a meal. 60 tablet 3   empagliflozin (JARDIANCE) 10 MG TABS tablet Take 1 tablet (10 mg total) by mouth daily. 30 tablet 3   furosemide (LASIX) 40 MG tablet Take 1 tablet (40 mg total) by mouth daily. 30 tablet 3   metFORMIN (GLUCOPHAGE-XR) 500 MG 24 hr tablet Take 500 mg by mouth daily with breakfast.     potassium chloride SA (KLOR-CON M) 20 MEQ tablet Take 1 tablet (20 mEq total) by mouth daily. 30 tablet 3   sacubitril-valsartan (ENTRESTO) 24-26 MG Take 1 tablet by mouth 2 (two) times daily. 60 tablet 3   spironolactone (ALDACTONE) 25 MG tablet Take 1/2 tablet (12.5 mg total) by mouth daily. 30 tablet 3   No current facility-administered medications for this visit.    There were no vitals filed for this visit. Wt Readings from Last 3 Encounters:  05/27/22 (!) 187 kg (412 lb 4.2 oz)  05/27/22 (!) 187.8 kg (414 lb)  05/23/22 (!) 189.1 kg (416 lb 14.4 oz)    PHYSICAL EXAM:  General:  Well appearing. No resp difficulty HEENT: normal Neck: supple. JVP flat. Carotids 2+ bilaterally; no bruits. No lymphadenopathy or thryomegaly appreciated. Cor: PMI normal. Regular rate & rhythm. No rubs, gallops or murmurs. Lungs: clear Abdomen: soft, nontender, nondistended. No hepatosplenomegaly. No bruits or masses. Good bowel sounds. Extremities: no cyanosis, clubbing, rash, edema Neuro: alert & orientedx3, cranial nerves grossly intact. Moves all 4 extremities w/o difficulty. Affect pleasant.   ECG:   ASSESSMENT & PLAN: Left MCA Stroke - MRI demonstrated patchy left MCA territory infarcts. - CVA almost certainly cardio-embolic in setting of severe LV dysfunction and possible silent AF (high risk with size. LV dysfunction and OSA).  - Symptoms much improved, nearly resolved - No antithrombotic prior to admission, now on ASA 81 mg daily and clopidogrel 75 mg daily.x 3 weeks and then  aspirin alone, per neurology recs - TTE w/ bubble, no shunt seen - TEE scheduled for today.  - Plan to start Eliquis today - Plan for loop recorder today - sleep study completed. +OSA. Tried CPAP last night, needs some mask adjustments - permissive hypertension per neurology   Chronic combined systolic and diastolic heart failure - Echo 12/23: EF 20-25%, LV with global hypokinesis, GIIDD, RV function normal, LA severly dilated, mild MR, no TR - L/RHC 05/13/22: no CAD, PCWP 22, LVEDP 32, Mild pulmonary HTN PAP 46/12 with mean 27 mm Hg, CO 5.56, CI 1.82 - Bubble TTE showed no significant shunt.  -  suspected CM d/t uncontrolled HTN vs PVCs - NYHA  Volume status   home lasix 40 mg daily - Continue Jardiance 10 mg daily - Entresto and coreg on hold with permissive hypertension per neurology - Continue spironolactone 12.5 mg daily   HTN - Has been stable - GDMT on hold as above   Elevated troponin - HsTrop 32>> 34>> 31>> 27 - Denies CP, EKG with no acute changes. Suspect demand ischemia   HLP - LDL 104 - Continue statin and diet changes   Obesity - Body mass index is 50.18 kg/m.  -   Obstructive sleep apnea - sleep study during admission + OSA    Shaana Acocella  NP-C 6:14 PM

## 2022-06-12 ENCOUNTER — Other Ambulatory Visit: Payer: Self-pay

## 2022-06-12 ENCOUNTER — Ambulatory Visit (INDEPENDENT_AMBULATORY_CARE_PROVIDER_SITE_OTHER): Payer: 59 | Admitting: Internal Medicine

## 2022-06-12 ENCOUNTER — Encounter: Payer: Self-pay | Admitting: Internal Medicine

## 2022-06-12 ENCOUNTER — Ambulatory Visit (HOSPITAL_COMMUNITY)
Admit: 2022-06-12 | Discharge: 2022-06-12 | Disposition: A | Discharge status: Home / Self Care | Payer: 59 | Attending: Adult Health | Admitting: Adult Health

## 2022-06-12 VITALS — BP 111/58 | HR 66 | Temp 97.9°F | Ht 76.0 in | Wt >= 6400 oz

## 2022-06-12 VITALS — BP 130/80 | HR 70 | Wt >= 6400 oz

## 2022-06-12 DIAGNOSIS — Z7984 Long term (current) use of oral hypoglycemic drugs: Secondary | ICD-10-CM

## 2022-06-12 DIAGNOSIS — I639 Cerebral infarction, unspecified: Secondary | ICD-10-CM

## 2022-06-12 DIAGNOSIS — Z6841 Body Mass Index (BMI) 40.0 and over, adult: Secondary | ICD-10-CM

## 2022-06-12 DIAGNOSIS — Z7901 Long term (current) use of anticoagulants: Secondary | ICD-10-CM | POA: Diagnosis not present

## 2022-06-12 DIAGNOSIS — G4733 Obstructive sleep apnea (adult) (pediatric): Secondary | ICD-10-CM

## 2022-06-12 DIAGNOSIS — E119 Type 2 diabetes mellitus without complications: Secondary | ICD-10-CM | POA: Insufficient documentation

## 2022-06-12 DIAGNOSIS — I5020 Unspecified systolic (congestive) heart failure: Secondary | ICD-10-CM

## 2022-06-12 DIAGNOSIS — Z79899 Other long term (current) drug therapy: Secondary | ICD-10-CM | POA: Insufficient documentation

## 2022-06-12 DIAGNOSIS — I5042 Chronic combined systolic (congestive) and diastolic (congestive) heart failure: Secondary | ICD-10-CM | POA: Diagnosis not present

## 2022-06-12 DIAGNOSIS — I110 Hypertensive heart disease with heart failure: Secondary | ICD-10-CM | POA: Insufficient documentation

## 2022-06-12 DIAGNOSIS — Z8673 Personal history of transient ischemic attack (TIA), and cerebral infarction without residual deficits: Secondary | ICD-10-CM | POA: Diagnosis not present

## 2022-06-12 DIAGNOSIS — E785 Hyperlipidemia, unspecified: Secondary | ICD-10-CM

## 2022-06-12 DIAGNOSIS — I4729 Other ventricular tachycardia: Secondary | ICD-10-CM

## 2022-06-12 DIAGNOSIS — I5022 Chronic systolic (congestive) heart failure: Secondary | ICD-10-CM | POA: Diagnosis not present

## 2022-06-12 DIAGNOSIS — E669 Obesity, unspecified: Secondary | ICD-10-CM | POA: Insufficient documentation

## 2022-06-12 DIAGNOSIS — I272 Pulmonary hypertension, unspecified: Secondary | ICD-10-CM | POA: Insufficient documentation

## 2022-06-12 LAB — CBC
HCT: 46.9 % (ref 39.0–52.0)
Hemoglobin: 15.8 g/dL (ref 13.0–17.0)
MCH: 28.2 pg (ref 26.0–34.0)
MCHC: 33.7 g/dL (ref 30.0–36.0)
MCV: 83.8 fL (ref 80.0–100.0)
Platelets: 163 10*3/uL (ref 150–400)
RBC: 5.6 MIL/uL (ref 4.22–5.81)
RDW: 13.2 % (ref 11.5–15.5)
WBC: 5.3 10*3/uL (ref 4.0–10.5)
nRBC: 0 % (ref 0.0–0.2)

## 2022-06-12 LAB — BASIC METABOLIC PANEL
Anion gap: 9 (ref 5–15)
BUN: 13 mg/dL (ref 6–20)
CO2: 25 mmol/L (ref 22–32)
Calcium: 9.7 mg/dL (ref 8.9–10.3)
Chloride: 102 mmol/L (ref 98–111)
Creatinine, Ser: 1.06 mg/dL (ref 0.61–1.24)
GFR, Estimated: >60 mL/min (ref >60)
Glucose, Bld: 95 mg/dL (ref 70–99)
Potassium: 4.5 mmol/L (ref 3.5–5.1)
Sodium: 136 mmol/L (ref 135–145)

## 2022-06-12 MED ORDER — METFORMIN HCL ER 500 MG PO TB24: 500.0000 mg | ORAL_TABLET | Freq: Every day | ORAL | 2 refills | Status: DC

## 2022-06-12 MED ORDER — ATORVASTATIN CALCIUM 40 MG PO TABS: 40.0000 mg | ORAL_TABLET | Freq: Every day | ORAL | 0 refills | Status: DC

## 2022-06-12 MED ORDER — CARVEDILOL 6.25 MG PO TABS: 3.1250 mg | ORAL_TABLET | Freq: Two times a day (BID) | ORAL | 3 refills | Status: DC

## 2022-06-12 MED ORDER — APIXABAN 5 MG PO TABS: 5.0000 mg | ORAL_TABLET | Freq: Two times a day (BID) | ORAL | 0 refills | Status: DC

## 2022-06-12 NOTE — Progress Notes (Signed)
   CC: cva f/u  HPI:[name] is a 47 y.o. male who presents for evaluation of cva f/u. Please see individual problem based A/P for details.   Depression, PHQ-9: Based on the patients  [place] Visit from 06/12/2022 in [place]  PHQ-9 Total Score 0      score we have .  Past Medical History:  Diagnosis Date   History of chickenpox    Hypertension    Myocardial injury 05/09/2022   Numbness and tingling of foot 10/31/2011   Rupture of right quadriceps tendon 08/14/2016   Review of Systems:   See hpi  Physical Exam: Vitals:   06/12/22 1113  BP: (!) 111/58  Pulse: 66  Temp: 97.9 F (36.6 C)  TempSrc: Oral  SpO2: 95%  Weight: (!) 407 lb 4.8 oz (184.8 kg)  Height: 6\' 4"  (1.93 m)     General: nad, morbidly obese HEENT: Conjunctiva nl , antiicteric sclerae, moist mucous membranes, no exudate or erythema Cardiovascular: Normal rate, regular rhythm.  No murmurs, rubs, or gallops Pulmonary : Equal breath sounds, No wheezes, rales, or rhonchi Abdominal: soft, nontender,  bowel sounds present Ext: No edema in lower extremities, no tenderness to palpation of lower extremities.   Assessment & Plan:   See Encounters Tab for problem based charting.  Patient discussed with Dr.  Cain Sieve

## 2022-06-12 NOTE — Patient Instructions (Addendum)
Dear Mr. Brandis,  Thank you for trusting [place] with your care. We discussed your recent stroke and heart.  For the stroke, please continue taking the Eliquis. STOP TAKING ASPIRIN AND PLAVIX. Please follow up with the neurologist on 2/22.   For the heart failure, please continue taking your medications as you have been. Please follow up with the heart doctor in 3 weeks.  We will see you back in 3 months.

## 2022-06-12 NOTE — Patient Instructions (Signed)
Increase Coreg to 6.25 mg twice daily. New Rx sent. Labs today - will call you if abnormal. Return to [facility] in Heart failure clinic in 3 weeks.

## 2022-06-13 ENCOUNTER — Other Ambulatory Visit (HOSPITAL_COMMUNITY): Payer: Self-pay

## 2022-06-14 ENCOUNTER — Encounter: Payer: Self-pay | Admitting: Internal Medicine

## 2022-06-14 NOTE — Assessment & Plan Note (Addendum)
Patient following up from recent hospitalization for CVA. All of his symptoms have resolved. Per neurology discharge instructions, he was to stop taking aspirin and plavix and just take eliquis. This was agreed upon with cardiology. There was some confusion about which medicines to take, and he had accidentally been taking aspirin, plavix, and eliqus. Fortunately, he has not had any bleeding or bruising.  Discussed this with him and informed him to stop the asa and plavix. He has follow up appointment with [facility] on 2/22 as stroke follow with with sleep study component.   Neuro exam is normal. No gaze deviation or facial droop, no change in sensation to face. Speech fluent. Strength and sensation are intact and symmetric in bilateral upper and lower extremities.   Given his low ejection fraction, etiology of stroke possibly cardioembolic in origin, so he will benefit the most from [facility] as opposed to antiplatelet therapy. Continue eliquis without aspirin & plavix. He will follow up with [facility] in 3 weeks.

## 2022-06-14 NOTE — Assessment & Plan Note (Signed)
Patient follows with cardiology and saw them today. His coreg was increased to 6.25 at that visit. They plan to have  him follow up with heart failure clinic in 3 weeks. He reports that his breathing is doing better than it had in a long time. No orthopnea, no LEE.   Heart is RRR without MRG. Lungs CTAB and breathing comfortably on RA. No LEE.   Heart failure appears to be well managed currently. He has close follow up with heart failure clinic. Will continue current regimen.

## 2022-06-16 ENCOUNTER — Other Ambulatory Visit (HOSPITAL_COMMUNITY): Payer: Self-pay

## 2022-06-16 ENCOUNTER — Other Ambulatory Visit: Payer: Self-pay

## 2022-06-16 NOTE — Progress Notes (Signed)
Internal Medicine Clinic Attending  Case discussed with Dr. Elliot Gurney  At the time of the visit.  We reviewed the resident's history and exam and pertinent patient test results.  I agree with the assessment, diagnosis, and plan of care documented in the resident's note.     We suspect that patient's CVA was cardioembolic given his low EF. TTE/TEE did not show an embolus. He will continue on Eliquis (without aspirin or plavix) for at least 3 months. I think we should repeat TTE around 08/10/2012, and if EF has recovered, he could potentially switch from Eliquis to anti-platelet monotherapy.

## 2022-06-29 ENCOUNTER — Other Ambulatory Visit: Payer: Self-pay | Admitting: Internal Medicine

## 2022-07-03 ENCOUNTER — Encounter (HOSPITAL_COMMUNITY): Payer: Self-pay

## 2022-07-03 ENCOUNTER — Ambulatory Visit (HOSPITAL_COMMUNITY)
Admission: RE | Admit: 2022-07-03 | Discharge: 2022-07-03 | Disposition: A | Discharge status: Home / Self Care | Payer: 59 | Source: Ambulatory Visit | Attending: Cardiology | Admitting: Cardiology

## 2022-07-03 ENCOUNTER — Encounter: Payer: Self-pay | Admitting: Neurology

## 2022-07-03 ENCOUNTER — Ambulatory Visit (INDEPENDENT_AMBULATORY_CARE_PROVIDER_SITE_OTHER): Payer: 59 | Admitting: Neurology

## 2022-07-03 VITALS — BP 144/88 | HR 63 | Ht 76.0 in | Wt 394.0 lb

## 2022-07-03 VITALS — BP 123/73 | HR 68 | Ht 76.0 in | Wt 395.0 lb

## 2022-07-03 DIAGNOSIS — I272 Pulmonary hypertension, unspecified: Secondary | ICD-10-CM | POA: Insufficient documentation

## 2022-07-03 DIAGNOSIS — G4733 Obstructive sleep apnea (adult) (pediatric): Secondary | ICD-10-CM | POA: Insufficient documentation

## 2022-07-03 DIAGNOSIS — I110 Hypertensive heart disease with heart failure: Secondary | ICD-10-CM | POA: Diagnosis present

## 2022-07-03 DIAGNOSIS — E66813 Obesity, class 3: Secondary | ICD-10-CM

## 2022-07-03 DIAGNOSIS — E669 Obesity, unspecified: Secondary | ICD-10-CM | POA: Diagnosis not present

## 2022-07-03 DIAGNOSIS — I5021 Acute systolic (congestive) heart failure: Secondary | ICD-10-CM | POA: Diagnosis not present

## 2022-07-03 DIAGNOSIS — I63412 Cerebral infarction due to embolism of left middle cerebral artery: Secondary | ICD-10-CM | POA: Diagnosis not present

## 2022-07-03 DIAGNOSIS — E119 Type 2 diabetes mellitus without complications: Secondary | ICD-10-CM | POA: Insufficient documentation

## 2022-07-03 DIAGNOSIS — Z6841 Body Mass Index (BMI) 40.0 and over, adult: Secondary | ICD-10-CM | POA: Insufficient documentation

## 2022-07-03 DIAGNOSIS — I4729 Other ventricular tachycardia: Secondary | ICD-10-CM

## 2022-07-03 DIAGNOSIS — Z7984 Long term (current) use of oral hypoglycemic drugs: Secondary | ICD-10-CM | POA: Diagnosis not present

## 2022-07-03 DIAGNOSIS — Z9189 Other specified personal risk factors, not elsewhere classified: Secondary | ICD-10-CM | POA: Diagnosis not present

## 2022-07-03 DIAGNOSIS — Z7901 Long term (current) use of anticoagulants: Secondary | ICD-10-CM | POA: Insufficient documentation

## 2022-07-03 DIAGNOSIS — Z79899 Other long term (current) drug therapy: Secondary | ICD-10-CM | POA: Insufficient documentation

## 2022-07-03 DIAGNOSIS — I5042 Chronic combined systolic (congestive) and diastolic (congestive) heart failure: Secondary | ICD-10-CM | POA: Diagnosis not present

## 2022-07-03 DIAGNOSIS — Z8673 Personal history of transient ischemic attack (TIA), and cerebral infarction without residual deficits: Secondary | ICD-10-CM | POA: Diagnosis not present

## 2022-07-03 DIAGNOSIS — E785 Hyperlipidemia, unspecified: Secondary | ICD-10-CM | POA: Insufficient documentation

## 2022-07-03 DIAGNOSIS — I5020 Unspecified systolic (congestive) heart failure: Secondary | ICD-10-CM

## 2022-07-03 HISTORY — DX: Cerebral infarction due to embolism of left middle cerebral artery: I63.412

## 2022-07-03 HISTORY — DX: Acute systolic (congestive) heart failure: I50.21

## 2022-07-03 LAB — BASIC METABOLIC PANEL
Anion gap: 12 (ref 5–15)
BUN: 12 mg/dL (ref 6–20)
CO2: 23 mmol/L (ref 22–32)
Calcium: 9.9 mg/dL (ref 8.9–10.3)
Chloride: 102 mmol/L (ref 98–111)
Creatinine, Ser: 1.04 mg/dL (ref 0.61–1.24)
GFR, Estimated: >60 mL/min (ref >60)
Glucose, Bld: 92 mg/dL (ref 70–99)
Potassium: 4.5 mmol/L (ref 3.5–5.1)
Sodium: 137 mmol/L (ref 135–145)

## 2022-07-03 LAB — BRAIN NATRIURETIC PEPTIDE: B Natriuretic Peptide: 78.1 pg/mL (ref 0.0–100.0)

## 2022-07-03 MED ORDER — ENTRESTO 97-103 MG PO TABS: 1.0000 | ORAL_TABLET | Freq: Two times a day (BID) | ORAL | 3 refills | Status: DC

## 2022-07-03 NOTE — Progress Notes (Signed)
Advanced Heart Failure Clinic Progress Note   PCP: Dr Humphrey Rolls  Primary Cardiologist: Dr Haroldine Laws  Neurology:  Farrel Conners   Reason for visit: F/u for chronic systolic heart failure and GDMT titration   HPI: Jeffery Benson is a 47 y.o. male with systolic heart failure (diagnosed 04/2022), HTN, HLD, CVA, OSA, and obesity.    Admitted 12/29-05/13/2022 with acute systolic heart failure.  Echo EF 20-25%. Diuresed well with IV lasix. Discharged on GDMT.     He was seen in HF [facility] clinic 05/27/22 and sent to the ED with stroke symptoms. Admitted 05/27/22 with stroke. MRI brain patchy left MCA territory infarcts.  Neurology consulted. Suspected cardioembolic. TEE EF 20-25%, no clot.   Sleep study in the hospital + for OSA. He did not qualify for the sleep smart study and was randomized to medical treatment arm. Referred for outpatient Neurology follow up. Started back on GDMT and eliquis.  Discharged 05/31/22.   Seen for post hospital f/u earlier this month. Was doing well. GDMT titrated.   He returns back today for further med titration. Continues to do well. Fully recovered from CVA. No deficits. Denies resting dyspnea. NYHA Class I. No orthopnea/PND or LEE. Wt stable and down since last visit. Denies CP. Reports full med compliance. Tolerating well w/o side effects. No orthostatic symptoms. BP 144/88. Pulse rate 63 bpm.   Scheduled for sleep clinic appt today to set up CPAP.     Cardiac studies reviewed 05/30/2022 TEE- No clot EF 25%  05/28/22: bubble study showed no shunt 12/23: EF 20-25%, LV with global hypokinesis, GIIDD, RV function normal, LA severly dilated, mild MR, no TR  LHC/RHC 05/13/22: no CAD, normal coronary anatomy, Mildly elevated LV filling pressures. PCWP 26/24 with mean 22 mm Hg. LVEDP 32 mm Hg, Mild pulmonary HTN PAP 46/12 with mean 27 mm Hg, Reduced Cardiac output 5.56 liters/min with index 1.82   ROS: All systems negative except as listed in HPI, PMH and Problem List.  SH:  Social History    Socioeconomic History   Marital status: Married    Spouse name: Jeffery Benson   Number of children: 2   Years of education: Not on file   Highest education level: Master's degree (e.g., MA, MS, MEng, MEd, MSW, MBA)  Occupational History   Occupation: Development worker, community  Tobacco Use   Smoking status: Never   Smokeless tobacco: Never  Vaping Use   Vaping Use: Never used  Substance and Sexual Activity   Alcohol use: Not on file    Comment: rare   Drug use: No   Sexual activity: Yes  Other Topics Concern   Not on file  Social History Narrative   Not on file   Social Determinants of Health   Financial Resource Strain: Low Risk  (05/13/2022)   Overall Financial Resource Strain (CARDIA)    Difficulty of Paying Living Expenses: Not very hard  Food Insecurity: No Food Insecurity (05/23/2022)   Hunger Vital Sign    Worried About Running Out of Food in the Last Year: Never true    Ran Out of Food in the Last Year: Never true  Transportation Needs: No Transportation Needs (05/23/2022)   PRAPARE - Hydrologist (Medical): No    Lack of Transportation (Non-Medical): No  Physical Activity: Not on file  Stress: Not on file  Social Connections: Unknown (05/23/2022)   Social Connection and Isolation Panel [NHANES]    Frequency of Communication with Friends and Family: More than three  times a week    Frequency of Social Gatherings with Friends and Family: More than three times a week    Attends Religious Services: Not on file    Active Member of Clubs or Organizations: No    Attends Archivist Meetings: Never    Marital Status: Married  Human resources officer Violence: Not At Risk (05/23/2022)   Humiliation, Afraid, Rape, and Kick questionnaire    Fear of Current or Ex-Partner: No    Emotionally Abused: No    Physically Abused: No    Sexually Abused: No    FH:  Family History  Problem Relation Age of Onset   Cancer Mother        bladder & ovarian   Diabetes Mother     Cancer Father        colon   Depression Maternal Aunt    Depression Maternal Grandmother    Alcohol abuse Paternal Grandfather     Past Medical History:  Diagnosis Date   History of chickenpox    Hypertension    Myocardial injury 05/09/2022   Numbness and tingling of foot 10/31/2011   Rupture of right quadriceps tendon 08/14/2016    Current Outpatient Medications  Medication Sig Dispense Refill   atorvastatin (LIPITOR) 40 MG tablet Take 1 tablet (40 mg total) by mouth daily. 30 tablet 0   carvedilol (COREG) 3.125 MG tablet Take 1 tablet (3.125 mg total) by mouth 2 (two) times daily with a meal. 60 tablet 3   ELIQUIS 5 MG TABS tablet TAKE 1 TABLET BY MOUTH TWICE A DAY 60 tablet 0   empagliflozin (JARDIANCE) 10 MG TABS tablet Take 1 tablet (10 mg total) by mouth daily. 30 tablet 3   furosemide (LASIX) 40 MG tablet Take 1 tablet (40 mg total) by mouth daily. 30 tablet 3   metFORMIN (GLUCOPHAGE-XR) 500 MG 24 hr tablet Take 1 tablet (500 mg total) by mouth daily with breakfast. 30 tablet 2   potassium chloride SA (KLOR-CON M) 20 MEQ tablet Take 1 tablet (20 mEq total) by mouth daily. 30 tablet 3   sacubitril-valsartan (ENTRESTO) 24-26 MG Take 1 tablet by mouth 2 (two) times daily. 60 tablet 3   spironolactone (ALDACTONE) 25 MG tablet Take 1/2 tablet (12.5 mg total) by mouth daily. 30 tablet 3   No current facility-administered medications for this encounter.    Vitals:   07/03/22 1116  BP: (!) 144/88  Pulse: 63  SpO2: 93%  Weight: (!) 178.7 kg (394 lb)  Height: 6' 4"$  (1.93 m)    Wt Readings from Last 3 Encounters:  07/03/22 (!) 178.7 kg (394 lb)  06/12/22 (!) 183.7 kg (405 lb)  06/12/22 (!) 184.8 kg (407 lb 4.8 oz)    PHYSICAL EXAM: General:  Well appearing, obese. No respiratory difficulty HEENT: normal Neck: supple. no JVD. Carotids 2+ bilat; no bruits. No lymphadenopathy or thyromegaly appreciated. Cor: PMI nondisplaced. Regular rate & rhythm. No rubs, gallops or  murmurs. Lungs: clear Abdomen: soft, nontender, nondistended. No hepatosplenomegaly. No bruits or masses. Good bowel sounds. Extremities: no cyanosis, clubbing, rash, edema Neuro: alert & oriented x 3, cranial nerves grossly intact. moves all 4 extremities w/o difficulty. Affect pleasant.   ECG: Not performed    ASSESSMENT & PLAN: 1. Left MCA Stroke - MRI demonstrated patchy left MCA territory infarcts. - CVA almost certainly cardio-embolic in setting of severe LV dysfunction and possible silent AF (high risk with size. LV dysfunction and OSA).  - TTE w/ bubble, no  shunt seen - Continue Eliquis 5 mg bid. Denies bleeding issues  - no residual deficits    Chronic combined systolic and diastolic heart failure - Echo 12/23: EF 20-25%, LV with global hypokinesis, GIIDD, RV function normal, LA severly dilated, mild MR, no TR - L/RHC 05/13/22: no CAD, PCWP 22, LVEDP 32, Mild pulmonary HTN PAP 46/12 with mean 27 mm Hg, CO 5.56, CI 1.82 - Bubble TTE showed no significant shunt.  - suspected CM d/t uncontrolled HTN vs PVCs - NYHA II. Volume status stable, wt continues to trend down  - Continue lasix 40 mg daily - Continue Coreg 6.25 mg bid. - Continue Jardiance 10 mg daily - Increase Entresto to 97-103 mg twice a day  - Continue spironolactone 12.5 mg daily. Plan titration next visit  - Check BMP/BNP today  - Plan to repeat ECHO in 3 months after HF meds optimized.     HTN - Stable  - GDMT per above  - Sleep Study + for OSA     4. HLD - LDL 104 on recent LP. Goal < 70  - Continue statin and diet changes - Needs f/u LP in 6 wks   5 Obesity - Body mass index is 47.96 kg/m. - Discussed portion control and activity.  - consider GLP1   6. Obstructive sleep apnea - sleep study + OSA - Has Neurology follow up to discuss CPAP    7. DMII  -Hgb A1C6.5 % -On metformin and jardiance.   F/u in 2-3 more wks w/ PharmD for further med titration. Plan repeat echo and MD visit in 2 months      Willma Obando  PA-C  11:21 AM

## 2022-07-03 NOTE — Progress Notes (Signed)
SLEEP MEDICINE CLINIC    Provider:  Larey Seat, MD  Primary Care Physician:  Idamae Schuller, MD [address] [place] [address]     Referring Provider: Carola Frost  Stroke research patient .        Chief Complaint according to patient   Patient presents with:     New Patient (Initial Visit)           HISTORY OF PRESENT ILLNESS:  Jeffery Benson is a 47 y.o. male patient who is seen upon referral on 07/03/2022 from Dr Leonie Man for a Sleep consultation.   Chief concern according to patient :  "I had in hospital sleep study and did not get into the branch with the free CPAP,  I am here to get a CPAP" .     I have the pleasure of seeing Jeffery Benson 07/03/22 a right-handed AA male who had recently suffered a stroke.  He has a high STOP Bang score and  had suffered a stroke on 05-28-2022.  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 The stroke affected the left middle cerebral artery territory and appeared patchy on initial MRI it was considered of cardioembolic etiology.  A CT of the head and neck with CTA did not show any vascular occlusion.  The MRI was later repeated and showed small acute infarcts in the left posterior insula, subcortical white matter posterior frontal lobe as well as the left parietal lobe.  The echocardiogram was repeated and showed severe dilation a dilated cardiomyopathy with an EF of 20 to 25%.  Hemoglobin A1c was 6.5, LDL was 104.  Prior to admission the patient was not on any anticoagulation but he started on aspirin and Plavix generic clopidogrel daily for 3 weeks and is then current was not advised to take aspirin alone so today he is on aspirin alone.  He is corrected and stated he is no longer on aspirin or Plavix  he is on Eliquis.  He is on Glucophage, potassium, Entresto, Aldactone, Jardiance, Lasix, Coreg.  He was started on a statin at that this time of discharge.  Dr. Leonie Man saw the patient on 30 May 2022. He remains under Dr Bensimon's care.    I am not privy to data gathered in a research study guided by Dr Leonie Man and have not seen the patient or his chart before today. Marrion Coy Geophysical data processor, office job. Non smoker, seldom drinker.  no shift work history.  He lives with his family, wife and 2 daughters, no pets.    Sleep habits are as follows: The patient's dinner time is between variable  PM. The patient goes to bed at 10-12 PM and continues to sleep for 4-5.5 hours, rare bathroom breaks.   The preferred sleep position is flat, laterally or supine , with the support of 1 pillow. Dreams are reportedly / frequent/.   The patient wakes up spontaneously, 4  AM is the usual rise time. He reports not feeling refreshed or restored in AM, with symptoms such as dry mouth, morning headaches, and residual fatigue. Naps are taken infrequently.    Review of Systems: Out of a complete 14 system review, the patient complains of only the following symptoms, and all other reviewed  systems are negative.:  Fatigue, snoring, fragmented sleep   How likely are you to doze in the following situations: 0 = not likely, 1 = slight chance, 2 = moderate chance, 3 = high chance   Sitting and Reading? Watching Television? Sitting inactive in a public place (theater or meeting)? As a passenger in a car for an hour without a break? Lying down in the afternoon when circumstances permit? Sitting and talking to someone? Sitting quietly after lunch without alcohol? In a car, while stopped for a few minutes in traffic?   Total = 13 / 24 points   FSS endorsed at 20/ 63 points.   Social History   Socioeconomic History   Marital status: Married    Spouse name: [name]   Number of children: 2   Years of education:  Not on file   Highest education level: Master's degree (e.g., MA, MS, MEng, MEd, MSW, MBA)  Occupational History   Occupation: Development worker, community  Tobacco Use   Smoking status: Never   Smokeless tobacco: Never  Vaping Use   Vaping Use: Never used  Substance and Sexual Activity   Alcohol use: Not on file    Comment: rare   Drug use: No   Sexual activity: Yes  Other Topics Concern   Not on file  Social History Narrative   Not on file   Social Determinants of Health   Financial Resource Strain: Low Risk  (05/13/2022)   Overall Financial Resource Strain (CARDIA)    Difficulty of Paying Living Expenses: Not very hard  Food Insecurity: No Food Insecurity (05/23/2022)   Hunger Vital Sign    Worried About Running Out of Food in the Last Year: Never true    Ran Out of Food in the Last Year: Never true  Transportation Needs: No Transportation Needs (05/23/2022)   PRAPARE - Hydrologist (Medical): No    Lack of Transportation (Non-Medical): No  Physical Activity: Not on file  Stress: Not on file  Social Connections: Unknown (05/23/2022)   Social Connection and Isolation Panel [NHANES]    Frequency of Communication with Friends and Family: More than three times a week    Frequency of Social Gatherings with Friends and Family: More than three times a week    Attends Religious Services: Not on file    Active Member of Clubs or Organizations: No    Attends Archivist Meetings: Never    Marital Status: Married    Family History  Problem Relation Age of Onset   Cancer Mother        bladder & ovarian   Diabetes Mother    Cancer Father        colon   Depression Maternal Aunt    Depression Maternal Grandmother    Alcohol abuse Paternal Grandfather     Past Medical History:  Diagnosis Date   History of chickenpox    Hypertension    Myocardial injury 05/09/2022   Numbness and tingling of foot 10/31/2011   Rupture of right quadriceps tendon 08/14/2016     Past Surgical History:  Procedure Laterality Date   ACHILLES TENDON REPAIR  2011   BUBBLE STUDY  05/30/2022   Procedure: BUBBLE STUDY;  Surgeon: Jolaine Artist, MD;  Location: [facility];  Service: Cardiovascular;;   QUADRICEPS TENDON REPAIR Right 08/14/2016   Procedure: REPAIR QUADRICEP TENDON;  Surgeon: Rod Can, MD;  Location: [facility];  Service: Orthopedics;  Laterality: Right;   RIGHT/LEFT HEART CATH  AND CORONARY ANGIOGRAPHY N/A 05/13/2022   Procedure: RIGHT/LEFT HEART CATH AND CORONARY ANGIOGRAPHY;  Surgeon: Martinique, Peter M, MD;  Location: [facility];  Service: Cardiovascular;  Laterality: N/A;   SHOULDER HEMI-ARTHROPLASTY Right 11/09/2012   Procedure: RIGHT SHOULDER HEMI-ARTHROPLASTY;  Surgeon: Nita Sells, MD;  Location: [facility];  Service: Orthopedics;  Laterality: Right;   TEE WITHOUT CARDIOVERSION N/A 05/30/2022   Procedure: TRANSESOPHAGEAL ECHOCARDIOGRAM (TEE);  Surgeon: Jolaine Artist, MD;  Location: [facility];  Service: Cardiovascular;  Laterality: N/A;     Current Outpatient Medications on File Prior to Visit  Medication Sig Dispense Refill   atorvastatin (LIPITOR) 40 MG tablet Take 1 tablet (40 mg total) by mouth daily. 30 tablet 0   carvedilol (COREG) 3.125 MG tablet Take 1 tablet (3.125 mg total) by mouth 2 (two) times daily with a meal. 60 tablet 3   ELIQUIS 5 MG TABS tablet TAKE 1 TABLET BY MOUTH TWICE A DAY 60 tablet 0   empagliflozin (JARDIANCE) 10 MG TABS tablet Take 1 tablet (10 mg total) by mouth daily. 30 tablet 3   furosemide (LASIX) 40 MG tablet Take 1 tablet (40 mg total) by mouth daily. 30 tablet 3   metFORMIN (GLUCOPHAGE-XR) 500 MG 24 hr tablet Take 1 tablet (500 mg total) by mouth daily with breakfast. 30 tablet 2   potassium chloride SA (KLOR-CON M) 20 MEQ tablet Take 1 tablet (20 mEq total) by mouth daily. 30 tablet 3   spironolactone (ALDACTONE) 25 MG tablet Take 1/2 tablet (12.5 mg total) by mouth daily. 30 tablet 3   No  current facility-administered medications on file prior to visit.    Allergies  Allergen Reactions   Beef Fat (Tallow) [Tallow]     Does not eat ground beef.   Pork-Derived Products     Patient preference.     DIAGNOSTIC DATA (LABS, IMAGING, TESTING) - I reviewed patient records, labs, notes, testing and imaging myself where available.  Lab Results  Component Value Date   WBC 5.3 06/12/2022   HGB 15.8 06/12/2022   HCT 46.9 06/12/2022   MCV 83.8 06/12/2022   PLT 163 06/12/2022      Component Value Date/Time   NA 136 06/12/2022 1023   NA 136 05/23/2022 1202   K 4.5 06/12/2022 1023   CL 102 06/12/2022 1023   CO2 25 06/12/2022 1023   GLUCOSE 95 06/12/2022 1023   BUN 13 06/12/2022 1023   BUN 14 05/23/2022 1202   CREATININE 1.06 06/12/2022 1023   CALCIUM 9.7 06/12/2022 1023   PROT 7.2 05/27/2022 1545   ALBUMIN 4.2 05/27/2022 1545   AST 32 05/27/2022 1545   ALT 37 05/27/2022 1545   ALKPHOS 52 05/27/2022 1545   BILITOT 0.8 05/27/2022 1545   GFRNONAA >60 06/12/2022 1023   GFRAA >60 08/14/2016 2153   Lab Results  Component Value Date   CHOL 204 (H) 05/23/2022   HDL 83 05/23/2022   LDLCALC 104 (H) 05/23/2022   TRIG 95 05/23/2022   CHOLHDL 2.5 05/23/2022   Lab Results  Component Value Date   HGBA1C 6.5 (H) 05/23/2022   No results found for: "VITAMINB12" Lab Results  Component Value Date   TSH 2.328 05/13/2022    PHYSICAL EXAM:  Today's Vitals   07/03/22 1245  BP: 123/73  Pulse: 68  Weight: (!) 395 lb (179.2 kg)  Height: 6' 4"$  (1.93 m)   Body mass index is 48.08 kg/m.   Wt Readings from Last 3 Encounters:  07/03/22 (!) 395 lb (179.2 kg)  07/03/22 (!) 394 lb (178.7 kg)  06/12/22 (!) 405 lb (183.7 kg)     Ht Readings from Last 3 Encounters:  07/03/22 6' 4"$  (1.93 m)  07/03/22 6' 4"$  (1.93 m)  06/12/22 6' 4"$  (1.93 m)      General: The patient is awake, alert and appears not in acute distress. The patient is well groomed. Head: Normocephalic,  atraumatic. Neck is supple.  Mallampati 3,  neck circumference:20 inches . Nasal airflow patent.  Retrognathia is not seen.  Dental status: biological Cardiovascular:  Regular rate and cardiac rhythm by pulse,  without distended neck veins. Respiratory: Lungs are clear to auscultation.  He is not SOB now.  Skin:  With evidence of mild ankle edema,  Trunk: obese, 48   NEUROLOGIC EXAM: The patient is awake and alert, oriented to place and time.   Memory subjective described as intact.  Attention span & concentration ability appears normal.  Speech is fluent,  without dysarthria, dysphonia or aphasia. HIS MAIN STROKE SYMPTOM WAS APHASIA,   Mood and affect are appropriate.   Cranial nerves: no loss of smell or taste reported  Pupils are equal and briskly reactive to light. Funduscopic exam deferred.  Extraocular movements in vertical and horizontal planes were intact No Diplopia. Visual fields by finger perimetry are intact. Hearing was intact to soft voice and finger rubbing.    Facial sensation intact to fine touch.  Facial motor strength is symmetric / tongue and uvula move midline.  Neck ROM : rotation, tilt and flexion extension were normal for age and shoulder shrug was symmetrical.    Motor exam:  Symmetric bulk, tone and ROM.   Normal tone without cog wheeling, symmetric grip strength .   Sensory:  Fine touch, pinprick and vibration were tested  and  normal.  Proprioception tested in the upper extremities was normal.   Coordination: Rapid alternating movements in the fingers/hands were of normal speed.  The Finger-to-nose maneuver was intact without evidence of ataxia, dysmetria or tremor.   Gait and station: Patient could rise unassisted from a seated position, walked without assistive device.  Stance is of wide base .  Toe and heel walk were deferred.  Deep tendon reflexes: in the  upper and lower extremities are symmetric and intact.  Babinski response was deferred.     ASSESSMENT AND PLAN 47 y.o. year old male  here with: left brain embolic stroke , thought to be cardio-embolic, CHF and EF of 123XX123 %.     1) Mr. Spittle only learned in early January of this year that he had an ejection fraction that was significantly reduced, and he received a diagnosis of heart failure and cardiomyopathy.  With an weeks he suffered a stroke and thank goodness this happened during the day so he was alert to the symptoms and they were witnessed by others at his workplace.  The first symptom was aphasia he could no longer form words or speak.  This lasted maybe 5 to 6 minutes and resolved and within an hour he developed right-sided weakness, clumsiness, and sensory loss. 2) the patient has indeed very high risk factors for the presence of obstructive sleep apnea but he also has risk factors for the presence of central sleep apnea.  Obstructive sleep apnea is usually considered and I barrier to breathing either by a large tongue small airway larger neck or by an abdominal girth that does not allow the diaphragm to pump effectively.  In contrast to the obstructive type of sleep apnea is a central sleep apnea when your brain is not pacing given express.  This can be a result of a stroke and can be a result of pH changes or blunted chemo receptor responses.   My goal for this patient is to undergo an in lab split-night study with an AHI of 10 and if needed emergency split at an earlier point.  The goal is to titrate the patient to CPAP or BiPAP what ever is needed to overcome the sleep apnea.  2) Long-term goal for this patient is to not suffer additional strokes.  Ongoing cardiac arrhythmia, central or obstructive apneas do pose an additional stroke risk for the future.  3)  I will order a HST so I do have a baseline _  and order today an autotitration CPAP device for the immediate  treatment of suspected apnea.    Our in-lab studies are too far booked to allow time sensitive  intervention. I would order a sleep study if central apnea arises or the AHI stays too high.   RV with Dr Leonie Man or Frann Rider in 2-4 months after PA therapy started.  I was told that this patient will not need follow up only a prescription for PAP therapy ?      CC: I will share my notes with Dr Leonie Man, MD.  After spending a total time of  40  minutes face to face and additional time for physical and neurologic examination, review of laboratory studies,  personal review of imaging studies, reports and results of other testing and review of referral information / records as far as provided in visit,   Electronically signed by: Larey Seat, MD 07/03/2022 12:58 PM  [facility] Board certified by The [employer] of Sleep Medicine and Diplomate of the [employer] of Sleep Medicine. Board certified In Neurology through the [place], Fellow of the [employer] of Neurology. Medical Director of [employer].

## 2022-07-03 NOTE — Patient Instructions (Addendum)
Increase Entresto to 97/103 mg twice daily. New Rx sent to local pharmacy. Labs today - will call you if abnormal. Return to [facility] in 3 weeks. Return to see Dr. Haroldine Laws with repeat echo in 2 months.  Please call us at [phone] if any questions or concerns prior to your next visit.

## 2022-07-07 ENCOUNTER — Telehealth: Payer: Self-pay | Admitting: Neurology

## 2022-07-07 NOTE — Telephone Encounter (Signed)
07/03/22 [employer] no auth req EE 2/26:lvm -[employer]

## 2022-07-09 NOTE — Progress Notes (Signed)
Advanced Heart Failure Clinic Note   PCP: Dr Humphrey Rolls  Primary Cardiologist: Dr Haroldine Laws  Neurology:  Farrel Conners   HPI:  Jeffery Benson is a 47 y.o. male with systolic heart failure (diagnosed 04/2022), HTN, HLD, CVA, OSA, and obesity.    Admitted 12/29-05/13/2022 with acute systolic heart failure.  Echo EF 20-25%. Diuresed well with IV Lasix. Discharged on GDMT.     He was seen in HF [facility] clinic 05/27/22 and sent to the ED with stroke symptoms. Admitted 05/27/22 with stroke. MRI brain patchy left MCA territory infarcts.  Neurology consulted. Suspected cardioembolic. TEE EF 20-25%, no clot.   Sleep study in the hospital + for OSA. He did not qualify for the sleep smart study and was randomized to medical treatment arm. Referred for outpatient Neurology follow up. Started back on GDMT and eliquis.  Discharged 05/31/22.    Seen for post hospital f/u earlier this month. Was doing well. GDMT titrated.    Presented to AHF Clinic 07/03/22 for follow up. Continued to do well. Fully recovered from CVA. No deficits. Denied resting dyspnea. NYHA Class I. No orthopnea/PND or LEE. Weight was stable and down since last visit. Denied CP. Reported full med compliance. Reported tolerating medications well w/o side effects. No orthostatic symptoms. BP 144/88. Pulse rate 63 bpm. Was scheduled for sleep clinic appt to set up CPAP.    Today he returns to HF clinic for pharmacist medication titration. At last visit with APP, Entresto was increased to 97/103 mg BID. Overall he is feeling well today. Says he has been working out regularly, going to the gym 3-4 times per week,  playing tennis 2 days per week and walking 3.5 miles on the weekends. No SOB/DOE. Has also improved his eating habits. He follows a low salt diet, avoids processed meats, limits dairy and has increased his intake of fruits and vegetables. Has been eating lean meats. No dizziness or lightheadedness. No CP or palpitations. Takes Lasix 40 mg daily and has not needed  any extra. No LEE, PND or orthopnea. Taking all medications as prescribed and tolerating all medications.    HF Medications: Carvedilol 3.125 mg BID Entresto 97/103 mg BID Spironolactone 12.5 mg daily Jardiance 10 mg daily Lasix 40 mg daily KCL 20 mEq daily  Has the patient been experiencing any side effects to the medications prescribed?  no  Does the patient have any problems obtaining medications due to transportation or finances?   No; [employer].  Understanding of regimen: excellent Understanding of indications: excellent Potential of compliance: excellent Patient understands to avoid NSAIDs. Patient understands to avoid decongestants.    Pertinent Lab Values: 07/03/22: Serum creatinine 1.04, BUN 12, Potassium 4.5, Sodium 137 BMET today pending  Vital Signs: Weight: 386.2 lbs (last clinic weight: 395 lbs) Blood pressure: 118/78  Heart rate: 77   Assessment/Plan: 1. Left MCA Stroke - MRI demonstrated patchy left MCA territory infarcts. - CVA almost certainly cardio-embolic in setting of severe LV dysfunction and possible silent AF (high risk with size. LV dysfunction and OSA).  - TTE w/ bubble, no shunt seen - Continue Eliquis 5 mg BID. Denies bleeding issues  - no residual deficits      Chronic combined systolic and diastolic heart failure - Echo 04/2022: EF 20-25%, LV with global hypokinesis, GIIDD, RV function normal, LA severly dilated, mild MR, no TR - L/RHC 05/13/22: no CAD, PCWP 22, LVEDP 32, Mild pulmonary HTN PAP 46/12 with mean 27 mmHg, CO 5.56, CI 1.82 -  Bubble TTE showed no significant shunt.  - suspected CM d/t uncontrolled HTN vs PVCs - NYHA II. Volume status stable. - BMET today pending. - Continue Lasix 40 mg daily - Increase carvedilol to 6.25 mg BID. - Continue Entresto 97-103 mg BID - Increase spironolactone to 25 mg daily. Discontinue potassium supplements. Repeat BMET in 2 weeks.  - Continue Jardiance 10 mg daily - Plan to repeat  ECHO 09/2022   HTN - Stable  - GDMT per above  - Sleep Study + for OSA     4. HLD - LDL 104 on recent LP. Goal < 70  - Continue statin and diet changes - Needs f/u LP in 6 wks   5 Obesity - Body mass index is 47.96 kg/m. - Has made significant lifestyle changes, improving diet and consistently exercising. Congratulated on his efforts.  - consider GLP1 in future   6. Obstructive sleep apnea - sleep study + OSA - Has Neurology follow up to discuss CPAP     7. DMII  -Hgb A1C 6.5 % -On metformin and jardiance.      Follow up 2 months with Dr. Haroldine Laws and echo.    Audry Riles, PharmD, BCPS, BCCP, CPP Heart Failure Clinic Pharmacist [phone]

## 2022-07-15 ENCOUNTER — Ambulatory Visit: Payer: 59 | Admitting: Cardiology

## 2022-07-16 ENCOUNTER — Other Ambulatory Visit (HOSPITAL_COMMUNITY): Payer: Self-pay

## 2022-07-16 ENCOUNTER — Other Ambulatory Visit: Payer: Self-pay

## 2022-07-23 ENCOUNTER — Ambulatory Visit (HOSPITAL_COMMUNITY)
Admission: RE | Admit: 2022-07-23 | Discharge: 2022-07-23 | Disposition: A | Discharge status: Home / Self Care | Payer: 59 | Source: Ambulatory Visit | Attending: Cardiology | Admitting: Cardiology

## 2022-07-23 VITALS — BP 118/78 | HR 77 | Wt 386.2 lb

## 2022-07-23 DIAGNOSIS — I5022 Chronic systolic (congestive) heart failure: Secondary | ICD-10-CM | POA: Diagnosis present

## 2022-07-23 LAB — BASIC METABOLIC PANEL
Anion gap: 11 (ref 5–15)
BUN: 15 mg/dL (ref 6–20)
CO2: 25 mmol/L (ref 22–32)
Calcium: 9.8 mg/dL (ref 8.9–10.3)
Chloride: 102 mmol/L (ref 98–111)
Creatinine, Ser: 1.06 mg/dL (ref 0.61–1.24)
GFR, Estimated: >60 mL/min (ref >60)
Glucose, Bld: 104 mg/dL — ABNORMAL HIGH (ref 70–99)
Potassium: 4.5 mmol/L (ref 3.5–5.1)
Sodium: 138 mmol/L (ref 135–145)

## 2022-07-23 MED ORDER — CARVEDILOL 6.25 MG PO TABS: 6.2500 mg | ORAL_TABLET | Freq: Two times a day (BID) | ORAL | 11 refills | Status: DC

## 2022-07-23 MED ORDER — SPIRONOLACTONE 25 MG PO TABS: 25.0000 mg | ORAL_TABLET | Freq: Every day | ORAL | 11 refills | Status: DC

## 2022-07-23 NOTE — Patient Instructions (Signed)
It was a pleasure seeing you today!  MEDICATIONS: -We are changing your medications today -Increase carvedilol to 6.25 mg (1 tablet) twice daily. -Increase spironolactone to 25 mg (1 tablet) daily. -Stop potassium tablets. -Call if you have questions about your medications.  LABS: -We will call you if your labs need attention.  NEXT APPOINTMENT: Return to clinic in 2 months with Dr. Haroldine Laws.  In general, to take care of your heart failure: -Limit your fluid intake to 2 Liters (half-gallon) per day.   -Limit your salt intake to ideally 2-3 grams (2000-3000 mg) per day. -Weigh yourself daily and record, and bring that "weight diary" to your next appointment.  (Weight gain of 2-3 pounds in 1 day typically means fluid weight.) -The medications for your heart are to help your heart and help you live longer.   -Please contact us before stopping any of your heart medications.  Call the clinic at [phone] with questions or to reschedule future appointments.

## 2022-07-30 ENCOUNTER — Other Ambulatory Visit (HOSPITAL_COMMUNITY): Payer: Self-pay | Admitting: Pharmacist

## 2022-07-30 MED ORDER — APIXABAN 5 MG PO TABS: 5.0000 mg | ORAL_TABLET | Freq: Two times a day (BID) | ORAL | 3 refills | Status: DC

## 2022-07-30 MED ORDER — ATORVASTATIN CALCIUM 40 MG PO TABS: 40.0000 mg | ORAL_TABLET | Freq: Every day | ORAL | 3 refills | Status: DC

## 2022-07-30 NOTE — Telephone Encounter (Signed)
Sent mychart message

## 2022-08-08 ENCOUNTER — Ambulatory Visit (HOSPITAL_COMMUNITY)
Admission: RE | Admit: 2022-08-08 | Discharge: 2022-08-08 | Disposition: A | Discharge status: Home / Self Care | Payer: 59 | Source: Ambulatory Visit | Attending: Internal Medicine | Admitting: Internal Medicine

## 2022-08-08 DIAGNOSIS — I5022 Chronic systolic (congestive) heart failure: Secondary | ICD-10-CM | POA: Insufficient documentation

## 2022-08-08 LAB — BASIC METABOLIC PANEL
Anion gap: 10 (ref 5–15)
BUN: 17 mg/dL (ref 6–20)
CO2: 27 mmol/L (ref 22–32)
Calcium: 9.9 mg/dL (ref 8.9–10.3)
Chloride: 101 mmol/L (ref 98–111)
Creatinine, Ser: 1.12 mg/dL (ref 0.61–1.24)
GFR, Estimated: >60 mL/min (ref >60)
Glucose, Bld: 105 mg/dL — ABNORMAL HIGH (ref 70–99)
Potassium: 4.5 mmol/L (ref 3.5–5.1)
Sodium: 138 mmol/L (ref 135–145)

## 2022-08-12 ENCOUNTER — Ambulatory Visit: Payer: 59 | Admitting: Neurology

## 2022-08-12 DIAGNOSIS — G4730 Sleep apnea, unspecified: Secondary | ICD-10-CM

## 2022-08-12 DIAGNOSIS — I639 Cerebral infarction, unspecified: Secondary | ICD-10-CM

## 2022-08-12 DIAGNOSIS — I63412 Cerebral infarction due to embolism of left middle cerebral artery: Secondary | ICD-10-CM

## 2022-08-12 DIAGNOSIS — R0902 Hypoxemia: Secondary | ICD-10-CM

## 2022-08-12 DIAGNOSIS — I5020 Unspecified systolic (congestive) heart failure: Secondary | ICD-10-CM

## 2022-08-12 DIAGNOSIS — I5021 Acute systolic (congestive) heart failure: Secondary | ICD-10-CM

## 2022-08-12 DIAGNOSIS — G4733 Obstructive sleep apnea (adult) (pediatric): Secondary | ICD-10-CM

## 2022-08-12 DIAGNOSIS — E66813 Obesity, class 3: Secondary | ICD-10-CM

## 2022-08-12 DIAGNOSIS — Z9189 Other specified personal risk factors, not elsewhere classified: Secondary | ICD-10-CM

## 2022-08-12 NOTE — Progress Notes (Deleted)
  Electrophysiology Office Follow up Visit Note:    Date:  08/12/2022   ID:  Jeffery Benson, DOB 09-26-1975, MRN [id number]  PCP:  Idamae Schuller, MD  [facility] Cardiologist:  None  [facility] Electrophysiologist:  None    Interval History:    Jeffery Benson is a 47 y.o. male who presents for a follow up visit.   I met the patient when he was hospitalized on May 30, 2022 for cryptogenic stroke.  At that time he was diagnosed with new onset systolic heart failure with an ejection fraction of 20%.  He was started on anticoagulation given the likely cardioembolic source with a reduced ejection fraction.  Given his reduced ejection fraction, we planned to touch base today to reassess his ejection fraction and to discuss possible ICD versus loop recorder.  He has an echo scheduled for Sep 17, 2022.     Past medical, surgical, social and family history were reviewed.  ROS:   Please see the history of present illness.    All other systems reviewed and are negative.  EKGs/Labs/Other Studies Reviewed:    The following studies were reviewed today:  ***  EKG:  The ekg ordered today demonstrates ***   Physical Exam:    VS:  There were no vitals taken for this visit.    Wt Readings from Last 3 Encounters:  07/23/22 (!) 386 lb 3.2 oz (175.2 kg)  07/03/22 (!) 394 lb (178.7 kg)  07/03/22 (!) 395 lb (179.2 kg)     GEN: *** Well nourished, well developed in no acute distress CARDIAC: ***RRR, no murmurs, rubs, gallops RESPIRATORY:  Clear to auscultation without rales, wheezing or rhonchi       ASSESSMENT:    No diagnosis found. PLAN:    In order of problems listed above:  ***Follow-up after echo in May.         Total time spent with patient today *** minutes. This includes reviewing records, evaluating the patient and coordinating care.    Signed, Lars Mage, MD, [facility], [facility] 08/12/2022 9:44 PM    Electrophysiology [facility]

## 2022-08-13 ENCOUNTER — Ambulatory Visit: Payer: 59 | Admitting: Cardiology

## 2022-08-14 NOTE — Progress Notes (Signed)
[facility] SLEEP TEST REPORT ( by Watch PAT)   STUDY DATA: 08-13-2022  DOB:  Oct 01, 1975 MRN: [id number]    ORDERING CLINICIAN: Melvyn Novas, MD  REFERRING CLINICIAN: Dr. Pearlean Brownie, Janalyn Shy- STROKE research   CLINICAL INFORMATION/HISTORY:07-03-2022 Sleep Consultation based on Dr. Marlis Edelson research study without any access to the data obtained in clinical research.  Dr. Pearlean Brownie saw the patient on 30 May 2022. He remains under Dr Bensimon's care.    06-12-2022, CVA and heart failure ,hospitalized at [facility]. "Suspected that CVA was cardioembolic given his low EF. TTE/TEE did not show an embolus. He will continue on Eliquis (without aspirin or plavix) for at least 3 months. I think we should repeat TTE around 08/10/2012, and if EF has recovered, he could potentially switch from Eliquis to anti-platelet monotherapy.He has follow up appointment with Dr Pearlean Brownie , MD at [facility] on 2/19 as stroke follow-up and  on 2/22 for sleep study preparation . "      Assessment & Plan Note by Adron Bene, MD at 06/14/2022 6:38 PM  05-27-2022 ED and hospital admission for CVA.followed by stroke team and internal medicine, cardiology for CHF.  History of unstable angina, SOB, and near syncope 05-08-2022.  Dr. Pearlean Brownie saw the patient on 30 May 2022. He remains under Dr Bensimon's care.    Epworth sleepiness score: 13 /24.  FSS at 20/63 points    BMI: 48 kg/m   Neck Circumference: 20"   FINDINGS:   Sleep Summary:   Total Recording Time (hours, min):    6 hours 1 minute   Total Sleep Time (hours, min):    4 hours 41 minutes             Percent REM (%): Inconclusive                                      Respiratory Indices (AASM) :   Calculated pAHI (per hour):    41.5/h,                          REM pAHI:    Inconclusive                                             NREM pAHI:   Inconclusive                           Positional AHI: The vast majority of the recording was in  supine sleep position with an AHI of 47.5/h, followed by right lateral sleep with an AHI of only 8.5/h.     Snoring reached a mean volume of 43 dB and was present for over half of the recorded time.                                               Oxygen Saturation Statistics:         O2 Saturation Range (%):      Between a nadir at 74% and maximum saturation of 98% and the mean saturation at  90%                                 O2 Saturation (minutes) <89%:   37 minutes or 13.2% of sleep time. O2 saturation (minutes) <90%:    55 minutes or 20% of total sleep time.  Pulse Rate Statistics:   Pulse Mean (bpm):   67 bpm              Pulse Range: Between 49 and 103 bpm.               IMPRESSION:  This HST confirms the presence of severe sleep apnea which could not be differentiated between obstructive and central nor between REM and non-REM sleep stages. This is associated with severe oxygen desaturation at a nadir of 74% with a significant proportion of sleep spent in hypoxia.   RECOMMENDATION:   Plan A: This severe degree of apnea and hypoxia would require either an urgent in-lab titration to CPAP, BiPAP or even ASV.  An in-lab titration study also has the benefit of offering a oxygen supplementation which may be needed in this case , as well as fitting a mask and trying it out in a supervised sleep environment .   Plan B:  If we are not able to provide a timely in lab positive airway pressure therapy, I will instead ask the patient to start with an auto-titration ResMed CPAP device and heated humidification, set between 5 and 20 cm water 3 cm expiratory pressure relief, and he is to be fitted with an interface that would allow him not to sleep supine. An ONO on auto CPAP should follow after 30 days of therapy. RV after 60 days of therapy with Stroke NP or me.      INTERPRETING PHYSICIAN:   Melvyn Novas, MD   Board certified in Sleep Medicine and Neurology, ABSM and ABPN  Medical  Director of [facility] at [facility].

## 2022-08-15 NOTE — Addendum Note (Signed)
Addended by: Melvyn Novas on: 08/15/2022 05:40 PM   Modules accepted: Orders

## 2022-08-15 NOTE — Procedures (Signed)
       [facility]   HOME SLEEP TEST REPORT ( by Watch PAT)   STUDY DATA: 08-13-2022  DOB:  07/19/1975 MRN: [id number]    ORDERING CLINICIAN: Kimesha Claxton, MD  REFERRING CLINICIAN: Dr. Sethi, Pramod- STROKE research   CLINICAL INFORMATION/HISTORY:07-03-2022 Sleep Consultation based on Dr. Sethi's research study without any access to the data obtained in clinical research.  Dr. Sethi saw the patient on 30 May 2022. He remains under Dr Bensimon's care.    06-12-2022, CVA and heart failure ,hospitalized at [facility]. "Suspected that CVA was cardioembolic given his low EF. TTE/TEE did not show an embolus. He will continue on Eliquis (without aspirin or plavix) for at least 3 months. I think we should repeat TTE around 08/10/2012, and if EF has recovered, he could potentially switch from Eliquis to anti-platelet monotherapy.He has follow up appointment with Dr Sethi , MD at [facility] on 2/19 as stroke follow-up and  on 2/22 for sleep study preparation . "      Assessment & Plan Note by Gawaluck, Greylon, MD at 06/14/2022 6:38 PM  05-27-2022 ED and hospital admission for CVA.followed by stroke team and internal medicine, cardiology for CHF.  History of unstable angina, SOB, and near syncope 05-08-2022.  Dr. Sethi saw the patient on 30 May 2022. He remains under Dr Bensimon's care.    Epworth sleepiness score: 13 /24.  FSS at 20/63 points    BMI: 48 kg/m   Neck Circumference: 20"   FINDINGS:   Sleep Summary:   Total Recording Time (hours, min):    6 hours 1 minute   Total Sleep Time (hours, min):    4 hours 41 minutes             Percent REM (%): Inconclusive                                      Respiratory Indices (AASM) :   Calculated pAHI (per hour):    41.5/h,                          REM pAHI:    Inconclusive                                             NREM pAHI:   Inconclusive                           Positional AHI: The vast majority of the recording was in  supine sleep position with an AHI of 47.5/h, followed by right lateral sleep with an AHI of only 8.5/h.     Snoring reached a mean volume of 43 dB and was present for over half of the recorded time.                                               Oxygen Saturation Statistics:         O2 Saturation Range (%):      Between a nadir at 74% and maximum saturation of 98% and the mean saturation at   O2 Saturation (minutes) <89%:   37 minutes or 13.2% of sleep time. O2 saturation (minutes) <90%:    55 minutes or 20% of total sleep time.  Pulse Rate Statistics:   Pulse Mean (bpm):   67 bpm              Pulse Range: Between 49 and 103 bpm.               IMPRESSION:  This HST confirms the presence of severe sleep apnea which could not be differentiated between obstructive and central nor between REM and non-REM sleep stages. This is associated with severe oxygen desaturation at a nadir of 74% with a significant proportion of sleep spent in hypoxia.   RECOMMENDATION:   Plan A: This severe degree of apnea and hypoxia would require either an urgent in-lab titration to CPAP, BiPAP or even ASV.  An in-lab titration study also has the benefit of offering a oxygen supplementation which may be needed in this case , as well as fitting a mask and trying it out in a supervised sleep environment .   Plan B:  If we are not able to provide a timely in lab positive airway pressure therapy, I will instead ask the patient to start with an auto-titration ResMed CPAP device and heated humidification, set between 5 and 20 cm water 3 cm expiratory pressure relief, and he is to be fitted with an interface that would allow him not to sleep supine. An ONO on auto CPAP should follow after 30 days of therapy. RV after 60 days of therapy with Stroke NP or me.      INTERPRETING PHYSICIAN:   Melvyn Novas, MD   Board certified in Sleep Medicine and Neurology, ABSM and ABPN  Medical Director  of [facility] at [facility].

## 2022-08-19 ENCOUNTER — Telehealth: Payer: Self-pay

## 2022-08-19 NOTE — Telephone Encounter (Signed)
Contacted pt, LVM rq call back  

## 2022-08-19 NOTE — Telephone Encounter (Addendum)
Pt returned call, I called pt. I advised pt that Dr. Vickey Huger reviewed their sleep study results and found that pt has sever OSA. Dr. Vickey Huger recommends that pt starts autoPAP. I reviewed PAP compliance expectations with the pt. Pt is agreeable to starting a CPAP. I advised pt that an order will be sent to a DME, [facility], and [facility] will call the pt within about one week after they file with the pt's insurance. [facility] will show the pt how to use the machine, fit for masks, and troubleshoot the CPAP if needed. A follow up appt was made for insurance purposes with Amy L on 7/8 330. Pt is doing well from a CVA standpoint, no new concerns. Appts will be combined. Pt verbalized understanding to arrive 15 minutes early and bring their CPAP. Pt verbalized understanding of results. Pt had no questions at this time but was encouraged to call back if questions arise. I have sent the order to [facility] and have received confirmation that they have received the order.

## 2022-08-19 NOTE — Telephone Encounter (Signed)
-----   Message from Melvyn Novas, MD sent at 08/15/2022  5:40 PM EDT ----- Please note order for urgent in lab titration if in any way possible. There are imperative factors here, severity of apnea, hypoxemia and acute CVA with newly diagnosed CHF.   If that cannot be done, please forward Plan B: order for auto PAP.

## 2022-09-08 ENCOUNTER — Other Ambulatory Visit: Payer: Self-pay

## 2022-09-16 NOTE — Progress Notes (Signed)
Advanced Heart Failure Clinic Progress Note   PCP: Dr Welton Flakes  Primary Cardiologist: Dr Gala Romney  Neurology:  Levin Erp   Reason for visit: F/u for chronic systolic heart failure and GDMT titration   HPI: Jeffery Benson is a 47 y.o. male with systolic heart failure (diagnosed 04/2022), HTN, HLD, CVA, OSA, and obesity.    Admitted 12/23 with acute systolic heart failure.  Echo EF 20-25%. Cath no CAD.  cMRI 1/24 LVEF 24% RVEF 38% Basal septal midwall LGE, which a scar pattern seen in nonischemic cardiomyopathies and associated with worse prognosis   He was seen in HF TOC clinic 05/27/22 and sent to the ED with stroke symptoms. MRI brain patchy left MCA territory infarcts.  Neurology consulted. Suspected cardioembolic. TEE EF 20-25%, no clot.     Found to have severe OSA. Following with Neuro to get CPAP/   He returns back today for further med titration. Continues to do well. Fully recovered from CVA.      Cardiac studies reviewed 05/30/2022 TEE- No clot EF 25%  05/28/22: bubble study showed no shunt 12/23: EF 20-25%, LV with global hypokinesis, GIIDD, RV function normal, LA severly dilated, mild MR, no TR  LHC/RHC 05/13/22: no CAD, normal coronary anatomy, Mildly elevated LV filling pressures. PCWP 26/24 with mean 22 mm Hg. LVEDP 32 mm Hg, Mild pulmonary HTN PAP 46/12 with mean 27 mm Hg, Reduced Cardiac output 5.56 liters/min with index 1.82   ROS: All systems negative except as listed in HPI, PMH and Problem List.  SH:  Social History   Socioeconomic History   Marital status: Married    Spouse name: [name]   Number of children: 2   Years of education: Not on file   Highest education level: Master's degree (e.g., MA, MS, MEng, MEd, MSW, MBA)  Occupational History   Occupation: Scientist, research (life sciences)  Tobacco Use   Smoking status: Never   Smokeless tobacco: Never  Vaping Use   Vaping Use: Never used  Substance and Sexual Activity   Alcohol use: Not on file    Comment: rare   Drug use: No    Sexual activity: Yes  Other Topics Concern   Not on file  Social History Narrative   Not on file   Social Determinants of Health   Financial Resource Strain: Low Risk  (05/13/2022)   Overall Financial Resource Strain (CARDIA)    Difficulty of Paying Living Expenses: Not very hard  Food Insecurity: No Food Insecurity (05/23/2022)   Hunger Vital Sign    Worried About Running Out of Food in the Last Year: Never true    Ran Out of Food in the Last Year: Never true  Transportation Needs: No Transportation Needs (05/23/2022)   PRAPARE - Administrator, Civil Service (Medical): No    Lack of Transportation (Non-Medical): No  Physical Activity: Not on file  Stress: Not on file  Social Connections: Unknown (05/23/2022)   Social Connection and Isolation Panel [NHANES]    Frequency of Communication with Friends and Family: More than three times a week    Frequency of Social Gatherings with Friends and Family: More than three times a week    Attends Religious Services: Not on file    Active Member of Clubs or Organizations: No    Attends Banker Meetings: Never    Marital Status: Married  Catering manager Violence: Not At Risk (05/23/2022)   Humiliation, Afraid, Rape, and Kick questionnaire    Fear of Current or Ex-Partner:  No    Emotionally Abused: No    Physically Abused: No    Sexually Abused: No    FH:  Family History  Problem Relation Age of Onset   Cancer Mother        bladder & ovarian   Diabetes Mother    Cancer Father        colon   Depression Maternal Aunt    Depression Maternal Grandmother    Alcohol abuse Paternal Grandfather     Past Medical History:  Diagnosis Date   History of chickenpox    Hypertension    Myocardial injury 05/09/2022   Numbness and tingling of foot 10/31/2011   Rupture of right quadriceps tendon 08/14/2016    Current Outpatient Medications  Medication Sig Dispense Refill   apixaban (ELIQUIS) 5 MG TABS tablet Take 1  tablet (5 mg total) by mouth 2 (two) times daily. 60 tablet 3   atorvastatin (LIPITOR) 40 MG tablet Take 1 tablet (40 mg total) by mouth daily. 90 tablet 3   carvedilol (COREG) 6.25 MG tablet Take 1 tablet (6.25 mg total) by mouth 2 (two) times daily with a meal. 60 tablet 11   empagliflozin (JARDIANCE) 10 MG TABS tablet Take 1 tablet (10 mg total) by mouth daily. 30 tablet 3   furosemide (LASIX) 40 MG tablet Take 1 tablet (40 mg total) by mouth daily. 30 tablet 3   metFORMIN (GLUCOPHAGE-XR) 500 MG 24 hr tablet Take 1 tablet (500 mg total) by mouth daily with breakfast. 30 tablet 2   sacubitril-valsartan (ENTRESTO) 97-103 MG Take 1 tablet by mouth 2 (two) times daily. 180 tablet 3   spironolactone (ALDACTONE) 25 MG tablet Take 1 tablet (25 mg total) by mouth daily. 30 tablet 11   No current facility-administered medications for this encounter.    There were no vitals filed for this visit.   Wt Readings from Last 3 Encounters:  07/23/22 (!) 175.2 kg (386 lb 3.2 oz)  07/03/22 (!) 178.7 kg (394 lb)  07/03/22 (!) 179.2 kg (395 lb)    PHYSICAL EXAM: General:  Well appearing, obese. No respiratory difficulty HEENT: normal Neck: supple. no JVD. Carotids 2+ bilat; no bruits. No lymphadenopathy or thyromegaly appreciated. Cor: PMI nondisplaced. Regular rate & rhythm. No rubs, gallops or murmurs. Lungs: clear Abdomen: soft, nontender, nondistended. No hepatosplenomegaly. No bruits or masses. Good bowel sounds. Extremities: no cyanosis, clubbing, rash, edema Neuro: alert & oriented x 3, cranial nerves grossly intact. moves all 4 extremities w/o difficulty. Affect pleasant.   ECG: Not performed    ASSESSMENT & PLAN: 1. Left MCA Stroke - MRI demonstrated patchy left MCA territory infarcts. - CVA almost certainly cardio-embolic in setting of severe LV dysfunction and possible silent AF (high risk with size. LV dysfunction and OSA).  - TTE w/ bubble, no shunt seen - Continue Eliquis 5 mg  bid. Denies bleeding issues  - no residual deficits    Chronic combined systolic and diastolic heart failure - Echo 16/10: EF 20-25%, LV with global hypokinesis, GIIDD, RV function normal, LA severly dilated, mild MR, no TR - L/RHC 05/13/22: no CAD, PCWP 22, LVEDP 32, Mild pulmonary HTN PAP 46/12 with mean 27 mm Hg, CO 5.56, CI 1.82 - cMRI 1/24 LVEF 24% RVEF 38% Basal septal midwall LGE, which a scar pattern seen in nonischemic cardiomyopathies and associated with worse prognosis - Bubble TTE showed no significant shunt.  - suspected CM d/t uncontrolled HTN vs PVCs - NYHA II. Volume status stable, wt  continues to trend down  - Continue lasix 40 mg daily - Continue Coreg 6.25 mg bid. - Continue Jardiance 10 mg daily - Continue Entresto to 97-103 mg bid - Continue spironolactone 12.5 mg daily. Plan titration next visit  - Check BMP/BNP today  - Plan to repeat ECHO in 3 months after HF meds optimized.    HTN - Stable  - GDMT per above  -    4. HLD - LDL 104 on recent LP. Goal < 70  - Continue statin and diet changes   5 Obesity - There is no height or weight on file to calculate BMI. - Discussed portion control and activity.  - consider GLP1   6. Obstructive sleep apnea, severe - following with Neurology for [facility] (Dr. Vickey Huger)  7. DMII  -Hgb A1C6.5 % -On metformin and jardiance.   F/u in 2-3 more wks w/ PharmD for further med titration. Plan repeat echo and MD visit in 2 months     Arvilla Meres  PA-C  11:35 PM

## 2022-09-17 ENCOUNTER — Encounter (HOSPITAL_COMMUNITY): Payer: Self-pay | Admitting: Internal Medicine

## 2022-09-17 ENCOUNTER — Ambulatory Visit (HOSPITAL_COMMUNITY)
Admission: RE | Admit: 2022-09-17 | Discharge: 2022-09-17 | Disposition: A | Discharge status: Home / Self Care | Payer: 59 | Source: Ambulatory Visit | Attending: Internal Medicine | Admitting: Internal Medicine

## 2022-09-17 ENCOUNTER — Ambulatory Visit (HOSPITAL_BASED_OUTPATIENT_CLINIC_OR_DEPARTMENT_OTHER)
Admission: RE | Admit: 2022-09-17 | Discharge: 2022-09-17 | Disposition: A | Discharge status: Home / Self Care | Payer: 59 | Source: Ambulatory Visit | Attending: Internal Medicine | Admitting: Internal Medicine

## 2022-09-17 VITALS — BP 138/80 | HR 68 | Wt 365.2 lb

## 2022-09-17 DIAGNOSIS — I5042 Chronic combined systolic (congestive) and diastolic (congestive) heart failure: Secondary | ICD-10-CM | POA: Diagnosis not present

## 2022-09-17 DIAGNOSIS — Z833 Family history of diabetes mellitus: Secondary | ICD-10-CM | POA: Insufficient documentation

## 2022-09-17 DIAGNOSIS — G4733 Obstructive sleep apnea (adult) (pediatric): Secondary | ICD-10-CM | POA: Insufficient documentation

## 2022-09-17 DIAGNOSIS — I272 Pulmonary hypertension, unspecified: Secondary | ICD-10-CM | POA: Insufficient documentation

## 2022-09-17 DIAGNOSIS — I10 Essential (primary) hypertension: Secondary | ICD-10-CM | POA: Diagnosis not present

## 2022-09-17 DIAGNOSIS — Z8673 Personal history of transient ischemic attack (TIA), and cerebral infarction without residual deficits: Secondary | ICD-10-CM | POA: Insufficient documentation

## 2022-09-17 DIAGNOSIS — I5022 Chronic systolic (congestive) heart failure: Secondary | ICD-10-CM | POA: Diagnosis present

## 2022-09-17 DIAGNOSIS — I110 Hypertensive heart disease with heart failure: Secondary | ICD-10-CM | POA: Diagnosis not present

## 2022-09-17 DIAGNOSIS — Z6841 Body Mass Index (BMI) 40.0 and over, adult: Secondary | ICD-10-CM | POA: Insufficient documentation

## 2022-09-17 DIAGNOSIS — Z7901 Long term (current) use of anticoagulants: Secondary | ICD-10-CM | POA: Diagnosis not present

## 2022-09-17 DIAGNOSIS — E785 Hyperlipidemia, unspecified: Secondary | ICD-10-CM | POA: Diagnosis not present

## 2022-09-17 DIAGNOSIS — I5020 Unspecified systolic (congestive) heart failure: Secondary | ICD-10-CM

## 2022-09-17 DIAGNOSIS — I5021 Acute systolic (congestive) heart failure: Secondary | ICD-10-CM | POA: Diagnosis not present

## 2022-09-17 DIAGNOSIS — Z7984 Long term (current) use of oral hypoglycemic drugs: Secondary | ICD-10-CM | POA: Insufficient documentation

## 2022-09-17 DIAGNOSIS — E119 Type 2 diabetes mellitus without complications: Secondary | ICD-10-CM | POA: Insufficient documentation

## 2022-09-17 DIAGNOSIS — Z79899 Other long term (current) drug therapy: Secondary | ICD-10-CM | POA: Diagnosis not present

## 2022-09-17 DIAGNOSIS — E66812 Obesity, class 2: Secondary | ICD-10-CM

## 2022-09-17 LAB — ECHOCARDIOGRAM COMPLETE
AR max vel: 2.56 cm2
AV Area VTI: 2.61 cm2
AV Area mean vel: 2.9 cm2
AV Mean grad: 4.5 mmHg
AV Peak grad: 8.1 mmHg
Ao pk vel: 1.42 m/s
Area-P 1/2: 3.99 cm2
Calc EF: 45.3 %
S' Lateral: 5.9 cm
Single Plane A2C EF: 39.5 %
Single Plane A4C EF: 48.8 %

## 2022-09-17 LAB — BASIC METABOLIC PANEL
Anion gap: 8 (ref 5–15)
BUN: 14 mg/dL (ref 6–20)
CO2: 26 mmol/L (ref 22–32)
Calcium: 10 mg/dL (ref 8.9–10.3)
Chloride: 104 mmol/L (ref 98–111)
Creatinine, Ser: 0.9 mg/dL (ref 0.61–1.24)
GFR, Estimated: >60 mL/min (ref >60)
Glucose, Bld: 109 mg/dL — ABNORMAL HIGH (ref 70–99)
Potassium: 4.7 mmol/L (ref 3.5–5.1)
Sodium: 138 mmol/L (ref 135–145)

## 2022-09-17 LAB — BRAIN NATRIURETIC PEPTIDE: B Natriuretic Peptide: 53.8 pg/mL (ref 0.0–100.0)

## 2022-09-17 MED ORDER — PERFLUTREN LIPID MICROSPHERE
1.0000 mL | INTRAVENOUS | Status: DC | PRN
  Administered 2022-09-17: 5 mL via INTRAVENOUS
  Filled 2022-09-17: qty 10

## 2022-09-17 MED ORDER — FUROSEMIDE 40 MG PO TABS: 40.0000 mg | ORAL_TABLET | Freq: Every day | ORAL | 3 refills | Status: DC

## 2022-09-17 MED ORDER — EMPAGLIFLOZIN 10 MG PO TABS: 10.0000 mg | ORAL_TABLET | Freq: Every day | ORAL | 3 refills | Status: DC

## 2022-09-17 MED ORDER — EMPAGLIFLOZIN 10 MG PO TABS: 10.0000 mg | ORAL_TABLET | Freq: Every day | ORAL | 11 refills | Status: DC

## 2022-09-17 MED ORDER — CARVEDILOL 6.25 MG PO TABS: 9.3750 mg | ORAL_TABLET | Freq: Two times a day (BID) | ORAL | 11 refills | Status: DC

## 2022-09-17 NOTE — Patient Instructions (Signed)
INCREASE Coreg to 9.375 Twice daily   Labs done today, your results will be available in MyChart, we will contact you for abnormal readings.  We have given you a script for a blood pressure cuff   Your physician recommends that you schedule a follow-up appointment in: 1 month  If you have any questions or concerns before your next appointment please send [place] a message through [place] or call our office at [phone].    TO LEAVE A MESSAGE FOR THE NURSE SELECT OPTION 2, PLEASE LEAVE A MESSAGE INCLUDING: YOUR NAME DATE OF BIRTH CALL BACK NUMBER REASON FOR CALL**this is important as we prioritize the call backs  YOU WILL RECEIVE A CALL BACK THE SAME DAY AS LONG AS YOU CALL BEFORE 4:00 PM  .hf

## 2022-09-23 ENCOUNTER — Ambulatory Visit: Payer: 59 | Attending: Cardiology | Admitting: Cardiology

## 2022-09-23 ENCOUNTER — Encounter: Payer: Self-pay | Admitting: Cardiology

## 2022-09-23 VITALS — BP 122/72 | HR 63 | Ht 76.0 in | Wt 360.0 lb

## 2022-09-23 DIAGNOSIS — G4733 Obstructive sleep apnea (adult) (pediatric): Secondary | ICD-10-CM

## 2022-09-23 DIAGNOSIS — I5022 Chronic systolic (congestive) heart failure: Secondary | ICD-10-CM | POA: Diagnosis not present

## 2022-09-23 NOTE — Patient Instructions (Signed)
Medication Instructions:  Your physician recommends that you continue on your current medications as directed. Please refer to the Current Medication list given to you today.  *If you need a refill on your cardiac medications before your next appointment, please call your pharmacy*  Follow-Up: At [facility], you and your health needs are our priority.  As part of our continuing mission to provide you with exceptional heart care, we have created designated Provider Care Teams.  These Care Teams include your primary Cardiologist (physician) and Advanced Practice Providers (APPs -  Physician Assistants and Nurse Practitioners) who all work together to provide you with the care you need, when you need it.  Your next appointment:   9 month(s)  Provider:   You will see one of the following Advanced Practice Providers on your designated Care Team:   Francis Dowse, [place]" [place], [place] Sherie Don, NP

## 2022-09-23 NOTE — Progress Notes (Signed)
Electrophysiology Office Follow up Visit Note:    Date:  09/23/2022   ID:  Jeffery Benson, DOB 02-21-1976, MRN [id number]  PCP:  [name], MD  [facility] Cardiologist:  None  [facility] Electrophysiologist:  Lanier Prude, MD    Interval History:    Jeffery Benson is a 47 y.o. male who presents for a follow up visit.   Last seen May 30, 2022 when he was hospitalized for cryptogenic stroke.  Anticoagulation was started by the heart failure team and we planned outpatient follow-up to monitor his ejection fraction on guideline directed medical therapy.  If his EF did not improve in the outpatient setting, we plan for a dual-chamber ICD. He saw Dr. Gala Romney on Sep 17, 2022. His ejection fraction continues to improve on guideline directed medical therapy.  At the time of diagnosis his EF was 20 to 25% and now has improved to 30 to 35%.  He has NYHA class I symptoms.  After discussion with Dr. Gala Romney, the patient decided to wait an additional 3 to 6 months before reconsidering ICD implant .  Today, he reports that he is feeling good. He denies any palpitations or shortness of breath at this time.  He has done well with changing his lifestyle. He is exercising routinely at the gym 3-4 times a week, with both cardio and high rep weight lifting. Three times a week he will play tennis. On Saturday and Sunday mornings he completes a 3 mile walk. His breathing is great and he endorses good energy levels. No issues with LE edema.  He is tolerating his medications well.  He denies any chest pain, lightheadedness, headaches, syncope, orthopnea, or PND.     Past medical, surgical, social and family history were reviewed.  ROS:   Please see the history of present illness.    All other systems reviewed and are negative.  EKGs/Labs/Other Studies Reviewed:    The following studies were reviewed today:  Sep 17, 2022 echo EF 30 to 35%   Physical Exam:    VS:  BP 122/72    Pulse 63   Ht 6\' 4"  (1.93 m)   Wt (!) 360 lb (163.3 kg)   SpO2 95%   BMI 43.82 kg/m     Wt Readings from Last 3 Encounters:  09/23/22 (!) 360 lb (163.3 kg)  09/17/22 (!) 365 lb 3.2 oz (165.7 kg)  07/23/22 (!) 386 lb 3.2 oz (175.2 kg)     GEN:  Well nourished, well developed in no acute distress CARDIAC: RRR, no murmurs, rubs, gallops RESPIRATORY:  Clear to auscultation without rales, wheezing or rhonchi       ASSESSMENT:    1. Chronic systolic heart failure (HCC)   2. OSA (obstructive sleep apnea)    PLAN:    In order of problems listed above:  #Chronic systolic heart failure #Nonischemic cardiomyopathy EF 30 to 35%.  NYHA class I.  Follows with Dr. Gala Romney in the heart failure clinic. On good medical therapy with spironolactone, Entresto, Lasix, Jardiance, Coreg.  His ejection fraction continues to improve on, directed medical therapy.  We discussed his trajectory in detail during today's clinic appointment and the role that a defibrillator complaint patients with systolic heart failure.  After discussion and reviewing the discussions with Dr. Gala Romney, the patient decided to not proceed with ICD implant at this time.  We will plan to revisit his ejection fraction in 3 to 6 months.  If there is a decline in  his ejection fraction, would plan for ICD implant but if he continues to improve would recommend avoiding ICD given his young age and continuing with guideline directed medical therapy.  #History of stroke On Eliquis given suspected cardioembolic source  #Obesity Encouraged continued weight loss efforts.   Follow-up in 9 months with APP.  I,Mathew Stumpf,acting as a Neurosurgeon for Lanier Prude, MD.,have documented all relevant documentation on the behalf of Lanier Prude, MD,as directed by  Lanier Prude, MD while in the presence of Lanier Prude, MD.  I, Lanier Prude, MD, have reviewed all documentation for this visit. The documentation on  09/23/22 for the exam, diagnosis, procedures, and orders are all accurate and complete.   Signed, Steffanie Dunn, MD, [facility], [facility] 09/23/2022 9:45 PM    Electrophysiology [facility]

## 2022-09-23 NOTE — Progress Notes (Deleted)
  Electrophysiology Office Follow up Visit Note:    Date:  09/23/2022   ID:  Jeffery Benson, DOB April 21, 1976, MRN [id number]  PCP:  [name], MD  [facility] Cardiologist:  None  [facility] Electrophysiologist:  Lanier Prude, MD    Interval History:    Jeffery Benson is a 47 y.o. male who presents for a follow up visit.   Last seen May 30, 2022 when he was hospitalized for cryptogenic stroke.  Anticoagulation was started by the heart failure team and we planned outpatient follow-up to monitor his ejection fraction on guideline directed medical therapy.  If his EF did not improve in the outpatient setting, we plan for a dual-chamber ICD. He saw Dr. Gala Romney on Sep 17, 2022. His ejection fraction continues to improve on guideline directed medical therapy.  At the time of diagnosis his EF was 20 to 25% and now has improved to 30 to 35%.  He has NYHA class I symptoms.  After discussion with Dr. Gala Romney, the patient decided to wait an additional 3 to 6 months before reconsidering ICD implant .     Past medical, surgical, social and family history were reviewed.  ROS:   Please see the history of present illness.    All other systems reviewed and are negative.  EKGs/Labs/Other Studies Reviewed:    The following studies were reviewed today:  Sep 17, 2022 echo EF 30 to 35%    Physical Exam:    VS:  There were no vitals taken for this visit.    Wt Readings from Last 3 Encounters:  09/17/22 (!) 365 lb 3.2 oz (165.7 kg)  07/23/22 (!) 386 lb 3.2 oz (175.2 kg)  07/03/22 (!) 394 lb (178.7 kg)     GEN: *** Well nourished, well developed in no acute distress CARDIAC: ***RRR, no murmurs, rubs, gallops RESPIRATORY:  Clear to auscultation without rales, wheezing or rhonchi       ASSESSMENT:    No diagnosis found. PLAN:    In order of problems listed above:  #Chronic systolic heart failure #Nonischemic cardiomyopathy EF 30 to 35%.  NYHA class I.  Follows with  Dr. Gala Romney in the heart failure clinic. On good medical therapy with spironolactone, Entresto, Lasix, Jardiance, Coreg.  His ejection fraction continues to improve on, directed medical therapy.  We discussed his trajectory in detail during today's clinic appointment and the role that a defibrillator complaint patients with systolic heart failure.  After discussion and reviewing the discussions with Dr. Gala Romney, the patient decided to not proceed with ICD implant at this time.  We will plan to revisit his ejection fraction in 3 to 6 months.  If there is a decline in his ejection fraction, would plan for ICD implant but if he continues to improve would recommend avoiding ICD given his young age and continuing with guideline directed medical therapy.  #History of stroke On Eliquis given suspected cardioembolic source  #Obesity Encouraged continued weight loss efforts.        Signed, Steffanie Dunn, MD, [facility], [facility] 09/23/2022 5:26 AM    Electrophysiology [facility]

## 2022-09-25 ENCOUNTER — Other Ambulatory Visit: Payer: Self-pay | Admitting: Internal Medicine

## 2022-10-09 ENCOUNTER — Ambulatory Visit: Payer: 59 | Admitting: Family Medicine

## 2022-10-15 NOTE — Progress Notes (Signed)
Advanced Heart Failure Clinic Note   PCP: [name], MD Neurology:  [facility]  HF Cardiologist: Dr Gala Romney   Reason for visit: F/u for chronic systolic heart failure and GDMT titration   HPI: Jeffery Benson is a 47 y.o.. male with systolic heart failure (diagnosed 04/2022), HTN, HLD, CVA, OSA, and obesity.    Admitted 12/23 with acute systolic heart failure.  Echo EF 20-25%. Cath no CAD.  cMRI 1/24 LVEF 24% RVEF 38% Basal septal midwall LGE, which a scar pattern seen in nonischemic cardiomyopathies and associated with worse prognosis   He was seen in HF TOC clinic 05/27/22 and sent to the ED with stroke symptoms. MRI brain patchy left MCA territory infarcts.  Neurology consulted. Suspected cardioembolic. TEE showed EF 20-25%, no clot.     Found to have severe OSA. Following with Neuro to get CPAP  Echo 09/17/22 EF 30-35%.EF improved, plan to hold off on ICD and repeat echo in 3 months.  Today he returns for HF follow up. Overall feeling fine. Works out 4 days/week at [employer], just started swimming. Plays tennis 3x/week, walks 3 miles daily on the weekends. He has occasional sensations of being off balance he attributes to his CVA, no falls. Denies palpitations, CP, dizziness, edema, or PND/Orthopnea. Appetite ok. No fever or chills. Weight at home 342 pounds. Taking all medications. Now using CPAP.  Has lost > 100 lbs. Rare ETOH, no tobacco/no drugs. Home BP 110-115/60-70s.    Cardiac studies reviewed - Echo (5/24): EF 30-35%  - TEE (05/30/2022): No clot EF 25%   - 05/28/22: bubble study showed no shunt  - Echo (12/23): EF 20-25%, LV with global hypokinesis, GIIDD, RV function normal, LA severly dilated, mild MR, no TR  - L/RHC (05/13/22): no CAD, normal coronary anatomy, Mildly elevated LV filling pressures. PCWP 26/24 with mean 22 mm Hg. LVEDP 32 mm Hg, Mild pulmonary HTN PAP 46/12 with mean 27 mm Hg, Reduced Cardiac output 5.56 liters/min with index 1.82  ROS: All systems negative  except as listed in HPI, PMH and Problem List.  SH:  Social History   Socioeconomic History   Marital status: Married    Spouse name: [name]   Number of children: 2   Years of education: Not on file   Highest education level: Master's degree (e.g., MA, MS, MEng, MEd, MSW, MBA)  Occupational History   Occupation: Scientist, research (life sciences)  Tobacco Use   Smoking status: Never   Smokeless tobacco: Never  Vaping Use   Vaping Use: Never used  Substance and Sexual Activity   Alcohol use: Not on file    Comment: rare   Drug use: No   Sexual activity: Yes  Other Topics Concern   Not on file  Social History Narrative   Not on file   Social Determinants of Health   Financial Resource Strain: Low Risk  (05/13/2022)   Overall Financial Resource Strain (CARDIA)    Difficulty of Paying Living Expenses: Not very hard  Food Insecurity: No Food Insecurity (05/23/2022)   Hunger Vital Sign    Worried About Running Out of Food in the Last Year: Never true    Ran Out of Food in the Last Year: Never true  Transportation Needs: No Transportation Needs (05/23/2022)   PRAPARE - Administrator, Civil Service (Medical): No    Lack of Transportation (Non-Medical): No  Physical Activity: Not on file  Stress: Not on file  Social Connections: Unknown (05/23/2022)   Social Connection  and Isolation Panel [NHANES]    Frequency of Communication with Friends and Family: More than three times a week    Frequency of Social Gatherings with Friends and Family: More than three times a week    Attends Religious Services: Not on file    Active Member of Clubs or Organizations: No    Attends Banker Meetings: Never    Marital Status: Married  Catering manager Violence: Not At Risk (05/23/2022)   Humiliation, Afraid, Rape, and Kick questionnaire    Fear of Current or Ex-Partner: No    Emotionally Abused: No    Physically Abused: No    Sexually Abused: No   FH:  Family History  Problem Relation Age of  Onset   Cancer Mother        bladder & ovarian   Diabetes Mother    Cancer Father        colon   Depression Maternal Aunt    Depression Maternal Grandmother    Alcohol abuse Paternal Grandfather    Past Medical History:  Diagnosis Date   History of chickenpox    Hypertension    Myocardial injury 05/09/2022   Numbness and tingling of foot 10/31/2011   Rupture of right quadriceps tendon 08/14/2016   Current Outpatient Medications  Medication Sig Dispense Refill   apixaban (ELIQUIS) 5 MG TABS tablet Take 1 tablet (5 mg total) by mouth 2 (two) times daily. 60 tablet 3   atorvastatin (LIPITOR) 40 MG tablet Take 1 tablet (40 mg total) by mouth daily. 90 tablet 3   carvedilol (COREG) 6.25 MG tablet Take 1.5 tablets (9.375 mg total) by mouth 2 (two) times daily with a meal. 200 tablet 11   empagliflozin (JARDIANCE) 10 MG TABS tablet Take 1 tablet (10 mg total) by mouth daily. 30 tablet 11   furosemide (LASIX) 40 MG tablet Take 1 tablet (40 mg total) by mouth daily. 90 tablet 3   metFORMIN (GLUCOPHAGE-XR) 500 MG 24 hr tablet TAKE 1 TABLET BY MOUTH EVERY DAY WITH BREAKFAST 30 tablet 11   sacubitril-valsartan (ENTRESTO) 97-103 MG Take 1 tablet by mouth 2 (two) times daily. 180 tablet 3   spironolactone (ALDACTONE) 25 MG tablet Take 1 tablet (25 mg total) by mouth daily. 30 tablet 11   No current facility-administered medications for this encounter.   BP 107/70   Pulse 72   Wt (!) 156.9 kg (346 lb)   SpO2 95%   BMI 42.12 kg/m   Wt Readings from Last 3 Encounters:  10/17/22 (!) 156.9 kg (346 lb)  09/23/22 (!) 163.3 kg (360 lb)  09/17/22 (!) 165.7 kg (365 lb 3.2 oz)   PHYSICAL EXAM: General:  NAD. No resp difficulty, walked into clinic HEENT: Normal Neck: Supple. No JVD, thick neck. Carotids 2+ bilat; no bruits. No lymphadenopathy or thryomegaly appreciated. Cor: PMI nondisplaced. Regular rate & rhythm. No rubs, gallops or murmurs. Lungs: Clear Abdomen: Soft, nontender,  nondistended. No hepatosplenomegaly. No bruits or masses. Good bowel sounds. Extremities: No cyanosis, clubbing, rash, edema Neuro: Alert & oriented x 3, cranial nerves grossly intact. Moves all 4 extremities w/o difficulty. Affect pleasant.  ASSESSMENT & PLAN: 1. Chronic combined systolic and diastolic heart failure - Echo (12/23): EF 20-25%, LV with global hypokinesis, GIIDD, RV function normal, LA severly dilated, mild MR, no TR - L/RHC (05/13/22): no CAD, PCWP 22, LVEDP 32, Mild pulmonary HTN PAP 46/12 with mean 27 mm Hg, CO 5.56, CI 1.82 - cMRI (1/24): LVEF 24% RVEF 38%  Basal septal midwall LGE, which a scar pattern seen in nonischemic cardiomyopathies and associated with worse prognosis - Bubble TTE showed no significant shunt.  - Suspected CM d/t uncontrolled HTN vs PVCs - Echo (5/24): EF 30-35% Personally reviewed - NYHA I. Volume status looks good - Continue Lasix 40 mg daily. - Continue Coreg 9.75 bid. - Continue Jardiance 10 mg daily. - Continue Entresto 97-103 mg bid. - Continue spironolactone 25 mg daily.  - EF improving. Dr. Gala Romney has discussed ICD. Planning to hold off 3 months then repeat echo. - recent labs (09/17/22) reviewed and are stable, K 4.7, SCr 0.90  2. HTN - BP much improved. - GDMT as above - If BP creeps up > 130 systolic, favor adding BiDil  3. Left MCA Stroke - MRI demonstrated patchy left MCA territory infarcts. - CVA almost certainly cardio-embolic in setting of severe LV dysfunction and possible silent AF (high risk with size. LV dysfunction and OSA).  - TTE w/ bubble, no shunt seen - Continue Eliquis 5 mg bid. Denies bleeding issues  - No residual deficits    4. Obesity - Body mass index is 42.12 kg/m. - Hs lost > 100 lbs. Congratulated - Not interested in GLP1RA at this time  6. OSA, severe - following with Neurology for [facility] (Dr. Vickey Huger) - Continue CPAP  7. DM II  - Hgb A1C 6.5 % - On metformin and Jardiance.   Follow up in 3 months  with Dr. Gala Romney + echo.  Anderson Malta Prentiss Hammett  FNP-BC 9:16 AM

## 2022-10-17 ENCOUNTER — Encounter (HOSPITAL_COMMUNITY): Payer: Self-pay

## 2022-10-17 ENCOUNTER — Ambulatory Visit (HOSPITAL_COMMUNITY)
Admission: RE | Admit: 2022-10-17 | Discharge: 2022-10-17 | Disposition: A | Discharge status: Home / Self Care | Payer: 59 | Source: Ambulatory Visit | Attending: Family Medicine | Admitting: Family Medicine

## 2022-10-17 VITALS — BP 107/70 | HR 72 | Wt 346.0 lb

## 2022-10-17 DIAGNOSIS — I5022 Chronic systolic (congestive) heart failure: Secondary | ICD-10-CM | POA: Diagnosis not present

## 2022-10-17 DIAGNOSIS — Z6841 Body Mass Index (BMI) 40.0 and over, adult: Secondary | ICD-10-CM | POA: Diagnosis not present

## 2022-10-17 DIAGNOSIS — E785 Hyperlipidemia, unspecified: Secondary | ICD-10-CM | POA: Diagnosis not present

## 2022-10-17 DIAGNOSIS — G4733 Obstructive sleep apnea (adult) (pediatric): Secondary | ICD-10-CM | POA: Insufficient documentation

## 2022-10-17 DIAGNOSIS — I110 Hypertensive heart disease with heart failure: Secondary | ICD-10-CM | POA: Insufficient documentation

## 2022-10-17 DIAGNOSIS — Z7984 Long term (current) use of oral hypoglycemic drugs: Secondary | ICD-10-CM | POA: Diagnosis not present

## 2022-10-17 DIAGNOSIS — E119 Type 2 diabetes mellitus without complications: Secondary | ICD-10-CM

## 2022-10-17 DIAGNOSIS — I639 Cerebral infarction, unspecified: Secondary | ICD-10-CM | POA: Diagnosis not present

## 2022-10-17 DIAGNOSIS — I5042 Chronic combined systolic (congestive) and diastolic (congestive) heart failure: Secondary | ICD-10-CM | POA: Diagnosis present

## 2022-10-17 DIAGNOSIS — E669 Obesity, unspecified: Secondary | ICD-10-CM | POA: Insufficient documentation

## 2022-10-17 DIAGNOSIS — E118 Type 2 diabetes mellitus with unspecified complications: Secondary | ICD-10-CM | POA: Insufficient documentation

## 2022-10-17 DIAGNOSIS — Z79899 Other long term (current) drug therapy: Secondary | ICD-10-CM | POA: Diagnosis not present

## 2022-10-17 DIAGNOSIS — Z8673 Personal history of transient ischemic attack (TIA), and cerebral infarction without residual deficits: Secondary | ICD-10-CM | POA: Diagnosis not present

## 2022-10-17 DIAGNOSIS — I10 Essential (primary) hypertension: Secondary | ICD-10-CM | POA: Diagnosis not present

## 2022-10-17 NOTE — Patient Instructions (Signed)
Thank you for coming in today    Medications: No changes   Follow up appointments:  Your physician recommends that you schedule a follow-up appointment in:  2-3 months With Dr. Gala Romney echocardiogram  You will receive a reminder letter in the mail a few months in advance. If you don't receive a letter, please call our office to schedule the follow-up appointment.   Your physician has requested that you have an echocardiogram. Echocardiography is a painless test that uses sound waves to create images of your heart. It provides your doctor with information about the size and shape of your heart and how well your heart's chambers and valves are working. This procedure takes approximately one hour. There are no restrictions for this procedure.       Do the following things EVERYDAY: Weigh yourself in the morning before breakfast. Write it down and keep it in a log. Take your medicines as prescribed Eat low salt foods--Limit salt (sodium) to 2000 mg per day.  Stay as active as you can everyday Limit all fluids for the day to less than 2 liters   At the Advanced Heart Failure Clinic, you and your health needs are our priority. As part of our continuing mission to provide you with exceptional heart care, we have created designated Provider Care Teams. These Care Teams include your primary Cardiologist (physician) and Advanced Practice Providers (APPs- Physician Assistants and Nurse Practitioners) who all work together to provide you with the care you need, when you need it.   You may see any of the following providers on your designated Care Team at your next follow up: Dr Arvilla Meres Dr Marca Ancona Dr. Marcos Eke, NP Robbie Lis, [place] [facility] [place], [place] Brynda Peon, NP Karle Plumber, PharmD   Please be sure to bring in all your medications bottles to every appointment.    Thank you for choosing [facility]-Advanced [facility]  Failure [facility]  If you have any questions or concerns before your next appointment please send [place] a message through [place] or call our office at [phone].    TO LEAVE A MESSAGE FOR THE NURSE SELECT OPTION 2, PLEASE LEAVE A MESSAGE INCLUDING: YOUR NAME DATE OF BIRTH CALL BACK NUMBER REASON FOR CALL**this is important as we prioritize the call backs  YOU WILL RECEIVE A CALL BACK THE SAME DAY AS LONG AS YOU CALL BEFORE 4:00 PM

## 2022-11-12 ENCOUNTER — Telehealth: Payer: Self-pay

## 2022-11-17 ENCOUNTER — Other Ambulatory Visit: Payer: Self-pay

## 2022-11-17 ENCOUNTER — Encounter: Payer: 59 | Admitting: Family Medicine

## 2022-11-17 NOTE — Progress Notes (Deleted)
PATIENT: Jeffery Benson DOB: 11-Oct-1975  REASON FOR VISIT: follow up HISTORY FROM: patient  No chief complaint on file.   Sethi: CVA Dohmeier: sleep   HISTORY OF PRESENT ILLNESS:  11/17/22 ALL:  Jeffery Benson is a 47 y.o. male here today for follow up for OSA on CPAP.  He was seen in consult with Dr Vickey Huger 06/2022 for concerns of OSA following CVA 05/2022. He was seen last by Dr Pearlean Brownie 05/30/2022 in the hospital. 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 He was not a candidate for Sleep Smart trail and was referred to Dr Golden Hurter. HST showed severe OSA with total AHI 41.5/hr. Differentiation could not be made between obstructive or cental sleep apnea. O2 nadir 74%. In lab study not approved and he was started on autoPAP. Since,     HISTORY: (copied from Dr Dohmeier's previous note)  Jeffery Benson is a 47 y.o. male patient who is seen upon referral on 07/03/2022 from Dr Pearlean Brownie for a Sleep consultation.    Chief concern according to patient :  "I had in hospital sleep study and did not get into the branch with the free CPAP,  I am here to get a CPAP" .   I have the pleasure of seeing Jeffery Benson 07/03/22 a right-handed AA male who had recently suffered a stroke.  He has a high STOP Bang score and  had suffered a stroke on 05-28-2022.  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 The stroke affected the left middle cerebral artery territory and appeared patchy on initial MRI it was considered of cardioembolic etiology.  A CT of the head and neck with CTA did not show any vascular occlusion.  The MRI was later repeated and showed small acute infarcts in the left posterior insula, subcortical white matter posterior frontal lobe as well  as the left parietal lobe.  The echocardiogram was repeated and showed severe dilation a dilated cardiomyopathy with an EF of 20 to 25%.  Hemoglobin A1c was 6.5, LDL was 104.  Prior to admission the patient was not on any anticoagulation but he started on aspirin and Plavix generic clopidogrel daily for 3 weeks and is then current was not advised to take aspirin alone so today he is on aspirin alone.  He is corrected and stated he is no longer on aspirin or Plavix he is on Eliquis.  He is on Glucophage, potassium, Entresto, Aldactone, Jardiance, Lasix, Coreg.  He was started on a statin at that this time of discharge.  Dr. Pearlean Brownie saw the patient on 30 May 2022. He remains under Dr Bensimon's care.    I am not privy to data gathered in a research study guided by Dr Pearlean Brownie and have not seen the patient or his chart before today. [employer] Dietitian, office job. Non smoker, seldom drinker.  no shift work history.  He lives with his family, wife and 2 daughters, no pets.    Sleep habits are as follows: The patient's dinner time is between variable  PM. The patient goes to bed at 10-12 PM and continues to sleep for 4-5.5 hours, rare bathroom breaks.   The preferred sleep position is flat, laterally or supine , with the  support of 1 pillow. Dreams are reportedly / frequent/.   The patient wakes up spontaneously, 4  AM is the usual rise time. He reports not feeling refreshed or restored in AM, with symptoms such as dry mouth, morning headaches, and residual fatigue. Naps are taken infrequently.   REVIEW OF SYSTEMS: Out of a complete 14 system review of symptoms, the patient complains only of the following symptoms, and all other reviewed systems are negative.  ESS:  ALLERGIES: No Active Allergies  HOME MEDICATIONS: Outpatient Medications Prior to Visit  Medication Sig Dispense Refill   apixaban (ELIQUIS) 5 MG TABS tablet Take 1 tablet (5 mg total) by mouth 2 (two) times daily. 60 tablet 3    atorvastatin (LIPITOR) 40 MG tablet Take 1 tablet (40 mg total) by mouth daily. 90 tablet 3   carvedilol (COREG) 6.25 MG tablet Take 1.5 tablets (9.375 mg total) by mouth 2 (two) times daily with a meal. 200 tablet 11   empagliflozin (JARDIANCE) 10 MG TABS tablet Take 1 tablet (10 mg total) by mouth daily. 30 tablet 11   furosemide (LASIX) 40 MG tablet Take 1 tablet (40 mg total) by mouth daily. 90 tablet 3   metFORMIN (GLUCOPHAGE-XR) 500 MG 24 hr tablet TAKE 1 TABLET BY MOUTH EVERY DAY WITH BREAKFAST 30 tablet 11   sacubitril-valsartan (ENTRESTO) 97-103 MG Take 1 tablet by mouth 2 (two) times daily. 180 tablet 3   spironolactone (ALDACTONE) 25 MG tablet Take 1 tablet (25 mg total) by mouth daily. 30 tablet 11   No facility-administered medications prior to visit.    PAST MEDICAL HISTORY: Past Medical History:  Diagnosis Date   History of chickenpox    Hypertension    Myocardial injury 05/09/2022   Numbness and tingling of foot 10/31/2011   Rupture of right quadriceps tendon 08/14/2016    PAST SURGICAL HISTORY: Past Surgical History:  Procedure Laterality Date   ACHILLES TENDON REPAIR  2011   BUBBLE STUDY  05/30/2022   Procedure: BUBBLE STUDY;  Surgeon: Dolores Patty, MD;  Location[facility] ENDOSCOPY;  Service: Cardiovascular;;   QUADRICEPS TENDON REPAIR Right 08/14/2016   Procedure: REPAIR QUADRICEP TENDON;  Surgeon: Samson Frederic, MD;  Location: [facility] ORS;  Service: Orthopedics;  Laterality: Right;   RIGHT/LEFT HEART CATH AND CORONARY ANGIOGRAPHY N/A 05/13/2022   Procedure: RIGHT/LEFT HEART CATH AND CORONARY ANGIOGRAPHY;  Surgeon: Swaziland, Peter M, MD;  Location: [facility] INVASIVE CV LAB;  Service: Cardiovascular;  Laterality: N/A;   SHOULDER HEMI-ARTHROPLASTY Right 11/09/2012   Procedure: RIGHT SHOULDER HEMI-ARTHROPLASTY;  Surgeon: Mable Paris, MD;  Location: [facility] OR;  Service: Orthopedics;  Laterality: Right;   TEE WITHOUT CARDIOVERSION N/A 05/30/2022   Procedure: TRANSESOPHAGEAL  ECHOCARDIOGRAM (TEE);  Surgeon: Dolores Patty, MD;  Location: [facility] ENDOSCOPY;  Service: Cardiovascular;  Laterality: N/A;    FAMILY HISTORY: Family History  Problem Relation Age of Onset   Cancer Mother        bladder & ovarian   Diabetes Mother    Cancer Father        colon   Depression Maternal Aunt    Depression Maternal Grandmother    Alcohol abuse Paternal Grandfather     SOCIAL HISTORY: Social History   Socioeconomic History   Marital status: Married    Spouse name: [name]   Number of children: 2   Years of education: Not on file   Highest education level: Master's degree (e.g., MA, MS, MEng, MEd, MSW, MBA)  Occupational History   Occupation: Scientist, research (life sciences)  Tobacco Use   Smoking status: Never   Smokeless tobacco: Never  Vaping Use   Vaping Use: Never used  Substance and Sexual Activity   Alcohol use: Not on file    Comment: rare   Drug use: No   Sexual activity: Yes  Other Topics Concern   Not on file  Social History Narrative   Not on file   Social Determinants of Health   Financial Resource Strain: Low Risk  (05/13/2022)   Overall Financial Resource Strain (CARDIA)    Difficulty of Paying Living Expenses: Not very hard  Food Insecurity: No Food Insecurity (05/23/2022)   Hunger Vital Sign    Worried About Running Out of Food in the Last Year: Never true    Ran Out of Food in the Last Year: Never true  Transportation Needs: No Transportation Needs (05/23/2022)   PRAPARE - Administrator, Civil Service (Medical): No    Lack of Transportation (Non-Medical): No  Physical Activity: Not on file  Stress: Not on file  Social Connections: Unknown (05/23/2022)   Social Connection and Isolation Panel [NHANES]    Frequency of Communication with Friends and Family: More than three times a week    Frequency of Social Gatherings with Friends and Family: More than three times a week    Attends Religious Services: Not on Insurance claims handler of Clubs or  Organizations: No    Attends Banker Meetings: Never    Marital Status: Married  Catering manager Violence: Not At Risk (05/23/2022)   Humiliation, Afraid, Rape, and Kick questionnaire    Fear of Current or Ex-Partner: No    Emotionally Abused: No    Physically Abused: No    Sexually Abused: No     PHYSICAL EXAM  There were no vitals filed for this visit. There is no height or weight on file to calculate BMI.  Generalized: Well developed, in no acute distress  Cardiology: normal rate and rhythm, no murmur noted Respiratory: clear to auscultation bilaterally  Neurological examination  Mentation: Alert oriented to time, place, history taking. Follows all commands speech and language fluent Cranial nerve II-XII: Pupils were equal round reactive to light. Extraocular movements were full, visual field were full  Motor: The motor testing reveals 5 over 5 strength of all 4 extremities. Good symmetric motor tone is noted throughout.  Gait and station: Gait is normal.    DIAGNOSTIC DATA (LABS, IMAGING, TESTING) - I reviewed patient records, labs, notes, testing and imaging myself where available.      No data to display           Lab Results  Component Value Date   WBC 5.3 06/12/2022   HGB 15.8 06/12/2022   HCT 46.9 06/12/2022   MCV 83.8 06/12/2022   PLT 163 06/12/2022      Component Value Date/Time   NA 138 09/17/2022 0945   NA 136 05/23/2022 1202   K 4.7 09/17/2022 0945   CL 104 09/17/2022 0945   CO2 26 09/17/2022 0945   GLUCOSE 109 (H) 09/17/2022 0945   BUN 14 09/17/2022 0945   BUN 14 05/23/2022 1202   CREATININE 0.90 09/17/2022 0945   CALCIUM 10.0 09/17/2022 0945   PROT 7.2 05/27/2022 1545   ALBUMIN 4.2 05/27/2022 1545   AST 32 05/27/2022 1545   ALT 37 05/27/2022 1545   ALKPHOS 52 05/27/2022 1545   BILITOT 0.8 05/27/2022 1545   GFRNONAA >60 09/17/2022 0945   GFRAA >  60 08/14/2016 2153   Lab Results  Component Value Date   CHOL 204 (H)  05/23/2022   HDL 83 05/23/2022   LDLCALC 104 (H) 05/23/2022   TRIG 95 05/23/2022   CHOLHDL 2.5 05/23/2022   Lab Results  Component Value Date   HGBA1C 6.5 (H) 05/23/2022   No results found for: "VITAMINB12" Lab Results  Component Value Date   TSH 2.328 05/13/2022     ASSESSMENT AND PLAN 47 y.o. year old male  has a past medical history of History of chickenpox, Hypertension, Myocardial injury (05/09/2022), Numbness and tingling of foot (10/31/2011), and Rupture of right quadriceps tendon (08/14/2016). here with   No diagnosis found.    [name] is doing well on CPAP therapy. Compliance report reveals ***. *** was encouraged to continue using CPAP nightly and for greater than 4 hours each night. We will update supply orders as indicated. Risks of untreated sleep apnea review and education materials provided. Healthy lifestyle habits encouraged. *** will follow up in ***, sooner if needed. *** verbalizes understanding and agreement with this plan.    No orders of the defined types were placed in this encounter.    No orders of the defined types were placed in this encounter.     Shawnie Dapper, FNP-C 11/17/2022, 1:24 PM [facility] [address], [address] [place], [place] [address] [phone]

## 2022-11-19 NOTE — Progress Notes (Unsigned)
PATIENT: Jeffery Benson DOB: 1975/11/15  REASON FOR VISIT: follow up HISTORY FROM: patient  No chief complaint on file.   Sethi: CVA Dohmeier: sleep   HISTORY OF PRESENT ILLNESS:  11/19/22 ALL:  Jeffery Benson is a 47 y.o. male here today for follow up for OSA on CPAP.  He was seen in consult with Dr Vickey Huger 06/2022 for concerns of OSA following CVA 05/2022. He was seen last by Dr Pearlean Brownie 05/30/2022 in the hospital. 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 He was not a candidate for Sleep Smart trail and was referred to Dr Golden Hurter. HST showed Benson OSA with total AHI 41.5/hr. Differentiation could not be made between obstructive or cental sleep apnea. O2 nadir 74%. In lab study not approved and he was started on autoPAP. Since,     HISTORY: (copied from Dr Dohmeier's previous note)  CARLOSDANIEL Benson is a 47 y.o. male patient who is seen upon referral on 07/03/2022 from Dr Pearlean Brownie for a Sleep consultation.    Chief concern according to patient :  "I had in hospital sleep study and did not get into the branch with the free CPAP,  I am here to get a CPAP" .   I have the pleasure of seeing Jeffery Benson 07/03/22 a right-handed AA male who had recently suffered a stroke.  He has a high STOP Bang score and  had suffered a stroke on 05-28-2022.  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 The stroke affected the left middle cerebral artery territory and appeared patchy on initial MRI it was considered of cardioembolic etiology.  A CT of the head and neck with CTA did not show any vascular occlusion.  The MRI was later repeated and showed small acute infarcts in the left posterior insula, subcortical white matter posterior frontal lobe as well  as the left parietal lobe.  The echocardiogram was repeated and showed Benson dilation a dilated cardiomyopathy with an EF of 20 to 25%.  Hemoglobin A1c was 6.5, LDL was 104.  Prior to admission the patient was not on any anticoagulation but he started on aspirin and Plavix generic clopidogrel daily for 3 weeks and is then current was not advised to take aspirin alone so today he is on aspirin alone.  He is corrected and stated he is no longer on aspirin or Plavix he is on Eliquis.  He is on Glucophage, potassium, Entresto, Aldactone, Jardiance, Lasix, Coreg.  He was started on a statin at that this time of discharge.  Dr. Pearlean Brownie saw the patient on 30 May 2022. He remains under Dr Bensimon's care.    I am not privy to data gathered in a research study guided by Dr Pearlean Brownie and have not seen the patient or his chart before today. [employer] Dietitian, office job. Non smoker, seldom drinker.  no shift work history.  He lives with his family, wife and 2 daughters, no pets.    Sleep habits are as follows: The patient's dinner time is between variable  PM. The patient goes to bed at 10-12 PM and continues to sleep for 4-5.5 hours, rare bathroom breaks.   The preferred sleep position is flat, laterally or supine , with the  support of 1 pillow. Dreams are reportedly / frequent/.   The patient wakes up spontaneously, 4  AM is the usual rise time. He reports not feeling refreshed or restored in AM, with symptoms such as dry mouth, morning headaches, and residual fatigue. Naps are taken infrequently.   REVIEW OF SYSTEMS: Out of a complete 14 system review of symptoms, the patient complains only of the following symptoms, and all other reviewed systems are negative.  ESS:  ALLERGIES: No Active Allergies  HOME MEDICATIONS: Outpatient Medications Prior to Visit  Medication Sig Dispense Refill   apixaban (ELIQUIS) 5 MG TABS tablet Take 1 tablet (5 mg total) by mouth 2 (two) times daily. 60 tablet 3    atorvastatin (LIPITOR) 40 MG tablet Take 1 tablet (40 mg total) by mouth daily. 90 tablet 3   carvedilol (COREG) 6.25 MG tablet Take 1.5 tablets (9.375 mg total) by mouth 2 (two) times daily with a meal. 200 tablet 11   empagliflozin (JARDIANCE) 10 MG TABS tablet Take 1 tablet (10 mg total) by mouth daily. 30 tablet 11   furosemide (LASIX) 40 MG tablet Take 1 tablet (40 mg total) by mouth daily. 90 tablet 3   metFORMIN (GLUCOPHAGE-XR) 500 MG 24 hr tablet TAKE 1 TABLET BY MOUTH EVERY DAY WITH BREAKFAST 30 tablet 11   sacubitril-valsartan (ENTRESTO) 97-103 MG Take 1 tablet by mouth 2 (two) times daily. 180 tablet 3   spironolactone (ALDACTONE) 25 MG tablet Take 1 tablet (25 mg total) by mouth daily. 30 tablet 11   No facility-administered medications prior to visit.    PAST MEDICAL HISTORY: Past Medical History:  Diagnosis Date   History of chickenpox    Hypertension    Myocardial injury 05/09/2022   Numbness and tingling of foot 10/31/2011   Rupture of right quadriceps tendon 08/14/2016    PAST SURGICAL HISTORY: Past Surgical History:  Procedure Laterality Date   ACHILLES TENDON REPAIR  2011   BUBBLE STUDY  05/30/2022   Procedure: BUBBLE STUDY;  Surgeon: Dolores Patty, MD;  Location: [facility] ENDOSCOPY;  Service: Cardiovascular;;   QUADRICEPS TENDON REPAIR Right 08/14/2016   Procedure: REPAIR QUADRICEP TENDON;  Surgeon: Samson Frederic, MD;  Location: [facility] ORS;  Service: Orthopedics;  Laterality: Right;   RIGHT/LEFT HEART CATH AND CORONARY ANGIOGRAPHY N/A 05/13/2022   Procedure: RIGHT/LEFT HEART CATH AND CORONARY ANGIOGRAPHY;  Surgeon: Swaziland, Peter M, MD;  Location: [facility] INVASIVE CV LAB;  Service: Cardiovascular;  Laterality: N/A;   SHOULDER HEMI-ARTHROPLASTY Right 11/09/2012   Procedure: RIGHT SHOULDER HEMI-ARTHROPLASTY;  Surgeon: Mable Paris, MD;  Location: [facility] OR;  Service: Orthopedics;  Laterality: Right;   TEE WITHOUT CARDIOVERSION N/A 05/30/2022   Procedure: TRANSESOPHAGEAL  ECHOCARDIOGRAM (TEE);  Surgeon: Dolores Patty, MD;  Location: [facility] ENDOSCOPY;  Service: Cardiovascular;  Laterality: N/A;    FAMILY HISTORY: Family History  Problem Relation Age of Onset   Cancer Mother        bladder & ovarian   Diabetes Mother    Cancer Father        colon   Depression Maternal Aunt    Depression Maternal Grandmother    Alcohol abuse Paternal Grandfather     SOCIAL HISTORY: Social History   Socioeconomic History   Marital status: Married    Spouse name: [name]   Number of children: 2   Years of education: Not on file   Highest education level: Master's degree (e.g., MA, MS, MEng, MEd, MSW, MBA)  Occupational History   Occupation: Scientist, research (life sciences)  Tobacco Use   Smoking status: Never   Smokeless tobacco: Never  Vaping Use   Vaping Use: Never used  Substance and Sexual Activity   Alcohol use: Not on file    Comment: rare   Drug use: No   Sexual activity: Yes  Other Topics Concern   Not on file  Social History Narrative   Not on file   Social Determinants of Health   Financial Resource Strain: Low Risk  (05/13/2022)   Overall Financial Resource Strain (CARDIA)    Difficulty of Paying Living Expenses: Not very hard  Food Insecurity: No Food Insecurity (05/23/2022)   Hunger Vital Sign    Worried About Running Out of Food in the Last Year: Never true    Ran Out of Food in the Last Year: Never true  Transportation Needs: No Transportation Needs (05/23/2022)   PRAPARE - Administrator, Civil Service (Medical): No    Lack of Transportation (Non-Medical): No  Physical Activity: Not on file  Stress: Not on file  Social Connections: Unknown (05/23/2022)   Social Connection and Isolation Panel [NHANES]    Frequency of Communication with Friends and Family: More than three times a week    Frequency of Social Gatherings with Friends and Family: More than three times a week    Attends Religious Services: Not on Insurance claims handler of Clubs or  Organizations: No    Attends Banker Meetings: Never    Marital Status: Married  Catering manager Violence: Not At Risk (05/23/2022)   Humiliation, Afraid, Rape, and Kick questionnaire    Fear of Current or Ex-Partner: No    Emotionally Abused: No    Physically Abused: No    Sexually Abused: No     PHYSICAL EXAM  There were no vitals filed for this visit. There is no height or weight on file to calculate BMI.  Generalized: Well developed, in no acute distress  Cardiology: normal rate and rhythm, no murmur noted Respiratory: clear to auscultation bilaterally  Neurological examination  Mentation: Alert oriented to time, place, history taking. Follows all commands speech and language fluent Cranial nerve II-XII: Pupils were equal round reactive to light. Extraocular movements were full, visual field were full  Motor: The motor testing reveals 5 over 5 strength of all 4 extremities. Good symmetric motor tone is noted throughout.  Gait and station: Gait is normal.    DIAGNOSTIC DATA (LABS, IMAGING, TESTING) - I reviewed patient records, labs, notes, testing and imaging myself where available.      No data to display           Lab Results  Component Value Date   WBC 5.3 06/12/2022   HGB 15.8 06/12/2022   HCT 46.9 06/12/2022   MCV 83.8 06/12/2022   PLT 163 06/12/2022      Component Value Date/Time   NA 138 09/17/2022 0945   NA 136 05/23/2022 1202   K 4.7 09/17/2022 0945   CL 104 09/17/2022 0945   CO2 26 09/17/2022 0945   GLUCOSE 109 (H) 09/17/2022 0945   BUN 14 09/17/2022 0945   BUN 14 05/23/2022 1202   CREATININE 0.90 09/17/2022 0945   CALCIUM 10.0 09/17/2022 0945   PROT 7.2 05/27/2022 1545   ALBUMIN 4.2 05/27/2022 1545   AST 32 05/27/2022 1545   ALT 37 05/27/2022 1545   ALKPHOS 52 05/27/2022 1545   BILITOT 0.8 05/27/2022 1545   GFRNONAA >60 09/17/2022 0945   GFRAA >  60 08/14/2016 2153   Lab Results  Component Value Date   CHOL 204 (H)  05/23/2022   HDL 83 05/23/2022   LDLCALC 104 (H) 05/23/2022   TRIG 95 05/23/2022   CHOLHDL 2.5 05/23/2022   Lab Results  Component Value Date   HGBA1C 6.5 (H) 05/23/2022   No results found for: "VITAMINB12" Lab Results  Component Value Date   TSH 2.328 05/13/2022     ASSESSMENT AND PLAN 47 y.o. year old male  has a past medical history of History of chickenpox, Hypertension, Myocardial injury (05/09/2022), Numbness and tingling of foot (10/31/2011), and Rupture of right quadriceps tendon (08/14/2016). here with   No diagnosis found.    [name] is doing well on CPAP therapy. Compliance report reveals ***. *** was encouraged to continue using CPAP nightly and for greater than 4 hours each night. We will update supply orders as indicated. Risks of untreated sleep apnea review and education materials provided. Healthy lifestyle habits encouraged. *** will follow up in ***, sooner if needed. *** verbalizes understanding and agreement with this plan.    No orders of the defined types were placed in this encounter.    No orders of the defined types were placed in this encounter.     Shawnie Dapper, FNP-C 11/19/2022, 1:31 PM [facility] [address], [address] [place], [place] [address] [phone]

## 2022-11-19 NOTE — Patient Instructions (Incomplete)

## 2022-11-20 ENCOUNTER — Ambulatory Visit (INDEPENDENT_AMBULATORY_CARE_PROVIDER_SITE_OTHER): Payer: 59 | Admitting: Family Medicine

## 2022-11-20 ENCOUNTER — Encounter: Payer: Self-pay | Admitting: Family Medicine

## 2022-11-20 VITALS — BP 145/85 | HR 70 | Ht 76.0 in | Wt 343.0 lb

## 2022-11-20 DIAGNOSIS — G4733 Obstructive sleep apnea (adult) (pediatric): Secondary | ICD-10-CM

## 2022-11-20 DIAGNOSIS — I63412 Cerebral infarction due to embolism of left middle cerebral artery: Secondary | ICD-10-CM | POA: Diagnosis not present

## 2023-01-11 ENCOUNTER — Other Ambulatory Visit (HOSPITAL_COMMUNITY): Payer: Self-pay | Admitting: Internal Medicine

## 2023-01-13 ENCOUNTER — Other Ambulatory Visit (HOSPITAL_COMMUNITY): Payer: Self-pay | Admitting: Cardiology

## 2023-01-13 DIAGNOSIS — I5022 Chronic systolic (congestive) heart failure: Secondary | ICD-10-CM

## 2023-03-17 ENCOUNTER — Encounter (HOSPITAL_COMMUNITY): Payer: 59 | Admitting: Internal Medicine

## 2023-03-17 ENCOUNTER — Other Ambulatory Visit (HOSPITAL_COMMUNITY): Payer: 59

## 2023-03-18 ENCOUNTER — Encounter (HOSPITAL_COMMUNITY): Payer: Self-pay | Admitting: Internal Medicine

## 2023-03-18 ENCOUNTER — Ambulatory Visit (HOSPITAL_COMMUNITY)
Admission: RE | Admit: 2023-03-18 | Discharge: 2023-03-18 | Disposition: A | Discharge status: Home / Self Care | Payer: 59 | Source: Ambulatory Visit | Attending: Internal Medicine | Admitting: Internal Medicine

## 2023-03-18 ENCOUNTER — Ambulatory Visit (HOSPITAL_BASED_OUTPATIENT_CLINIC_OR_DEPARTMENT_OTHER)
Admission: RE | Admit: 2023-03-18 | Discharge: 2023-03-18 | Disposition: A | Discharge status: Home / Self Care | Payer: 59 | Source: Ambulatory Visit | Attending: Internal Medicine | Admitting: Internal Medicine

## 2023-03-18 VITALS — BP 126/84 | HR 53 | Wt 337.2 lb

## 2023-03-18 DIAGNOSIS — I5042 Chronic combined systolic (congestive) and diastolic (congestive) heart failure: Secondary | ICD-10-CM | POA: Insufficient documentation

## 2023-03-18 DIAGNOSIS — I110 Hypertensive heart disease with heart failure: Secondary | ICD-10-CM | POA: Diagnosis not present

## 2023-03-18 DIAGNOSIS — Z79899 Other long term (current) drug therapy: Secondary | ICD-10-CM | POA: Diagnosis not present

## 2023-03-18 DIAGNOSIS — Z7984 Long term (current) use of oral hypoglycemic drugs: Secondary | ICD-10-CM | POA: Insufficient documentation

## 2023-03-18 DIAGNOSIS — E785 Hyperlipidemia, unspecified: Secondary | ICD-10-CM | POA: Insufficient documentation

## 2023-03-18 DIAGNOSIS — Z8673 Personal history of transient ischemic attack (TIA), and cerebral infarction without residual deficits: Secondary | ICD-10-CM | POA: Diagnosis not present

## 2023-03-18 DIAGNOSIS — I5022 Chronic systolic (congestive) heart failure: Secondary | ICD-10-CM | POA: Diagnosis present

## 2023-03-18 DIAGNOSIS — I10 Essential (primary) hypertension: Secondary | ICD-10-CM

## 2023-03-18 DIAGNOSIS — G4733 Obstructive sleep apnea (adult) (pediatric): Secondary | ICD-10-CM | POA: Diagnosis not present

## 2023-03-18 DIAGNOSIS — Z7901 Long term (current) use of anticoagulants: Secondary | ICD-10-CM | POA: Diagnosis not present

## 2023-03-18 DIAGNOSIS — I480 Paroxysmal atrial fibrillation: Secondary | ICD-10-CM | POA: Diagnosis not present

## 2023-03-18 DIAGNOSIS — E669 Obesity, unspecified: Secondary | ICD-10-CM | POA: Insufficient documentation

## 2023-03-18 DIAGNOSIS — Z6841 Body Mass Index (BMI) 40.0 and over, adult: Secondary | ICD-10-CM | POA: Diagnosis not present

## 2023-03-18 DIAGNOSIS — E119 Type 2 diabetes mellitus without complications: Secondary | ICD-10-CM | POA: Diagnosis not present

## 2023-03-18 LAB — ECHOCARDIOGRAM COMPLETE
Area-P 1/2: 3.31 cm2
Calc EF: 46.2 %
S' Lateral: 4.7 cm
Single Plane A2C EF: 47.6 %
Single Plane A4C EF: 46 %

## 2023-03-18 NOTE — Patient Instructions (Signed)
No Labs done today.   No medication changes were made. Please continue all current medications as prescribed.  Your physician recommends that you schedule a follow-up appointment in: 6 months. Please contact our office in March 2025 to schedule a May 2025 appointment.   If you have any questions or concerns before your next appointment please send [place] a message through [place] or call our office at [phone].    TO LEAVE A MESSAGE FOR THE NURSE SELECT OPTION 2, PLEASE LEAVE A MESSAGE INCLUDING: YOUR NAME DATE OF BIRTH CALL BACK NUMBER REASON FOR CALL**this is important as we prioritize the call backs  YOU WILL RECEIVE A CALL BACK THE SAME DAY AS LONG AS YOU CALL BEFORE 4:00 PM   Do the following things EVERYDAY: Weigh yourself in the morning before breakfast. Write it down and keep it in a log. Take your medicines as prescribed Eat low salt foods--Limit salt (sodium) to 2000 mg per day.  Stay as active as you can everyday Limit all fluids for the day to less than 2 liters   At the [facility], you and your health needs are our priority. As part of our continuing mission to provide you with exceptional heart care, we have created designated Provider Care Teams. These Care Teams include your primary Cardiologist (physician) and Advanced Practice Providers (APPs- Physician Assistants and Nurse Practitioners) who all work together to provide you with the care you need, when you need it.   You may see any of the following providers on your designated Care Team at your next follow up: Dr Arvilla Meres Dr Marca Ancona Dr. Marcos Eke, NP Robbie Lis, [place] [facility] [place], [place] Brynda Peon, NP Karle Plumber, PharmD   Please be sure to bring in all your medications bottles to every appointment.    Thank you for choosing [facility]

## 2023-03-18 NOTE — Progress Notes (Signed)
Advanced Heart Failure Clinic Note   PCP: [name], MD Neurology:  [facility]  HF Cardiologist: Dr Gala Romney   Reason for visit: F/u for chronic systolic heart failure and GDMT titration   HPI: Jeffery Benson is a 47 y.o.. male with systolic heart failure (diagnosed 04/2022), HTN, HLD, CVA, OSA, and obesity.    Admitted 12/23 with acute systolic heart failure.  Echo EF 20-25%. Cath no CAD.  cMRI 1/24 LVEF 24% RVEF 38% Basal septal midwall LGE, which a scar pattern seen in nonischemic cardiomyopathies and associated with worse prognosis   He was seen in HF TOC clinic 05/27/22 and sent to the ED with stroke symptoms. MRI brain patchy left MCA territory infarcts.  Neurology consulted. Suspected cardioembolic. TEE showed EF 20-25%, no clot.     Found to have severe OSA. Following with Neuro to get CPAP  Echo 09/17/22 EF 30-35%.EF improved, plan to hold off on ICD and repeat echo in 3 months.  Today he returns for HF follow up. Feels good. Works out 4 days/week at [employer] with cardio and weights. Plays tennis 3x/week, walks 3-3.5 miles daily on the weekends. Breathing fine. No CP, edema, orthopnea or PND.     Cardiac studies reviewed - Echo (5/24): EF 30-35%  - TEE (05/30/2022): No clot EF 25%   - 05/28/22: bubble study showed no shunt  - Echo (12/23): EF 20-25%, LV with global hypokinesis, GIIDD, RV function normal, LA severly dilated, mild MR, no TR  - L/RHC (05/13/22): no CAD, normal coronary anatomy, Mildly elevated LV filling pressures. PCWP 26/24 with mean 22 mm Hg. LVEDP 32 mm Hg, Mild pulmonary HTN PAP 46/12 with mean 27 mm Hg, Reduced Cardiac output 5.56 liters/min with index 1.82  ROS: All systems negative except as listed in HPI, PMH and Problem List.  SH:  Social History   Socioeconomic History   Marital status: Married    Spouse name: Jeffery Benson   Number of children: 2   Years of education: Not on file   Highest education level: Master's degree (e.g., MA, MS, MEng, MEd, MSW,  MBA)  Occupational History   Occupation: Scientist, research (life sciences)  Tobacco Use   Smoking status: Never   Smokeless tobacco: Never  Vaping Use   Vaping status: Never Used  Substance and Sexual Activity   Alcohol use: Not on file    Comment: rare   Drug use: No   Sexual activity: Yes  Other Topics Concern   Not on file  Social History Narrative   Not on file   Social Determinants of Health   Financial Resource Strain: Low Risk  (05/13/2022)   Overall Financial Resource Strain (CARDIA)    Difficulty of Paying Living Expenses: Not very hard  Food Insecurity: No Food Insecurity (05/23/2022)   Hunger Vital Sign    Worried About Running Out of Food in the Last Year: Never true    Ran Out of Food in the Last Year: Never true  Transportation Needs: No Transportation Needs (05/23/2022)   PRAPARE - Administrator, Civil Service (Medical): No    Lack of Transportation (Non-Medical): No  Physical Activity: Not on file  Stress: Not on file  Social Connections: Unknown (05/23/2022)   Social Connection and Isolation Panel [NHANES]    Frequency of Communication with Friends and Family: More than three times a week    Frequency of Social Gatherings with Friends and Family: More than three times a week    Attends Religious Services: Not  on file    Active Member of Clubs or Organizations: No    Attends Banker Meetings: Never    Marital Status: Married  Catering manager Violence: Not At Risk (05/23/2022)   Humiliation, Afraid, Rape, and Kick questionnaire    Fear of Current or Ex-Partner: No    Emotionally Abused: No    Physically Abused: No    Sexually Abused: No   FH:  Family History  Problem Relation Age of Onset   Cancer Mother        bladder & ovarian   Diabetes Mother    Cancer Father        colon   Depression Maternal Aunt    Depression Maternal Grandmother    Alcohol abuse Paternal Grandfather    Past Medical History:  Diagnosis Date   History of chickenpox     Hypertension    Myocardial injury 05/09/2022   Numbness and tingling of foot 10/31/2011   Rupture of right quadriceps tendon 08/14/2016   Current Outpatient Medications  Medication Sig Dispense Refill   atorvastatin (LIPITOR) 40 MG tablet Take 1 tablet (40 mg total) by mouth daily. 90 tablet 3   carvedilol (COREG) 6.25 MG tablet Take 1.5 tablets (9.375 mg total) by mouth 2 (two) times daily with a meal. 200 tablet 11   ELIQUIS 5 MG TABS tablet TAKE 1 TABLET BY MOUTH TWICE A DAY 60 tablet 3   empagliflozin (JARDIANCE) 10 MG TABS tablet Take 1 tablet (10 mg total) by mouth daily. 30 tablet 11   furosemide (LASIX) 40 MG tablet Take 1 tablet (40 mg total) by mouth daily. 90 tablet 3   metFORMIN (GLUCOPHAGE-XR) 500 MG 24 hr tablet TAKE 1 TABLET BY MOUTH EVERY DAY WITH BREAKFAST 30 tablet 11   sacubitril-valsartan (ENTRESTO) 97-103 MG Take 1 tablet by mouth 2 (two) times daily. 180 tablet 3   spironolactone (ALDACTONE) 25 MG tablet Take 1 tablet (25 mg total) by mouth daily. 30 tablet 11   No current facility-administered medications for this encounter.   BP 126/84   Pulse (!) 53   Wt (!) 153 kg (337 lb 3.2 oz)   SpO2 96%   BMI 41.05 kg/m   Wt Readings from Last 3 Encounters:  03/18/23 (!) 153 kg (337 lb 3.2 oz)  11/20/22 (!) 155.6 kg (343 lb)  10/17/22 (!) 156.9 kg (346 lb)   PHYSICAL EXAM: General:  Well appearing. No resp difficulty HEENT: normal Neck: supple. no JVD. Carotids 2+ bilat; no bruits. No lymphadenopathy or thryomegaly appreciated. Cor: PMI nondisplaced. Regular rate & rhythm. No rubs, gallops or murmurs. Lungs: clear Abdomen: obese soft, nontender, nondistended. No hepatosplenomegaly. No bruits or masses. Good bowel sounds. Extremities: no cyanosis, clubbing, rash, edema Neuro: alert & orientedx3, cranial nerves grossly intact. moves all 4 extremities w/o difficulty. Affect pleasant  ECG: NSR 63 LAFB No ST-T wave abnormalities. >DBPR  ASSESSMENT & PLAN: 1. Chronic  combined systolic and diastolic heart failure - Echo (12/23): EF 20-25%, LV with global hypokinesis, GIIDD, RV function normal, LA severly dilated, mild MR, no TR - L/RHC (05/13/22): no CAD, PCWP 22, LVEDP 32, Mild pulmonary HTN PAP 46/12 with mean 27 mm Hg, CO 5.56, CI 1.82 - cMRI (1/24): LVEF 24% RVEF 38% Basal septal midwall LGE, which a scar pattern seen in nonischemic cardiomyopathies and associated with worse prognosis - Bubble TTE showed no significant shunt.  - Suspected CM d/t uncontrolled HTN vs PVCs - Echo (5/24): EF 30-35%  - Echo  today 03/18/23 EF 45-50% (Read as 40-45%) - Continue Lasix 40 mg daily can change PRN - Continue Coreg 9.75 bid. - Continue Jardiance 10 mg daily. - Continue Entresto 97-103 mg bid. - Continue spironolactone 25 mg daily.  - Doing very well NYHA I volume looks good. Ef recovering. Continue current regimen.   2. HTN - Blood pressure well controlled. Continue current regimen.  3. Left MCA Stroke - MRI demonstrated patchy left MCA territory infarcts. - CVA almost certainly cardio-embolic in setting of severe LV dysfunction and possible silent AF (high risk with size. LV dysfunction and OSA).  - TTE w/ bubble, no shunt seen - Continue Eliquis 5 mg bid. Denies bleeding issues  - No residual effects   4. Obesity - Body mass index is 41.05 kg/m. - Hs lost > 100+ lbs. Congratulated - Not interested in GLP1RA at this time  6. OSA, severe - following with Neurology for Jeffery Benson (Jeffery Benson) - Inconsistent with CPAP. Asked him to be more compliant   7. DM II  - Hgb A1C 6.5 % - On metformin and Jardiance.    Arvilla Meres MD 3:03 PM

## 2023-05-25 ENCOUNTER — Other Ambulatory Visit (HOSPITAL_COMMUNITY): Payer: Self-pay | Admitting: Internal Medicine

## 2023-05-25 ENCOUNTER — Ambulatory Visit: Payer: 59 | Admitting: Family Medicine

## 2023-05-25 ENCOUNTER — Telehealth: Payer: Self-pay | Admitting: Family Medicine

## 2023-05-25 DIAGNOSIS — Z0289 Encounter for other administrative examinations: Secondary | ICD-10-CM

## 2023-05-25 NOTE — Progress Notes (Deleted)
 PATIENT: Jeffery Benson DOB: 07-09-1975  REASON FOR VISIT: follow up HISTORY FROM: patient  No chief complaint on file.   Sethi: CVA Dohmeier: sleep   HISTORY OF PRESENT ILLNESS:  05/25/23 ALL:  Jeffery Benson returns for follow up for OSA on CPAP.   He continues Eliquis  and atorvastatin .   11/20/2022 ALL:  Jeffery Benson is a 48 y.o. male here today for follow up for OSA on CPAP.  He was seen in consult with Dr Chalice 06/2022 for concerns of OSA following CVA 05/2022. He was seen last by Dr Rosemarie 05/30/2022 in the hospital. 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 He was not a candidate for Sleep Smart trail and was referred to Dr Marlow. HST showed severe OSA with total AHI 41.5/hr. Differentiation could not be made between obstructive or cental sleep apnea. O2 nadir 74%. In lab study not approved and he was started on autoPAP. Since, he reports doing  fairly well on therapy when using. He admits that compliance has been low. He falls asleep on his couch and does not use his machine. He does feel a little better rested when using his machine. He doesn't normally get 4 hours of sleep at a time. He denies concerns with machine or supplies.     HISTORY: (copied from Dr Dohmeier's previous note)  Jeffery Benson is a 48 y.o. male patient who is seen upon referral on 07/03/2022 from Dr Rosemarie for a Sleep consultation.    Chief concern according to patient :  I had in hospital sleep study and did not get into the branch with the free CPAP,  I am here to get a CPAP .   I have the pleasure of seeing Jeffery Benson 07/03/22 a right-handed AA male who had recently suffered a stroke.  He has a high STOP Bang score and  had suffered a stroke on 05-28-2022.  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 The stroke affected the left middle cerebral artery territory and appeared patchy on initial MRI it was considered of cardioembolic etiology.  A CT of the head and neck with CTA did not show any vascular occlusion.  The MRI was later repeated and showed small acute infarcts in the left posterior insula, subcortical white matter posterior frontal lobe as well as the left parietal lobe.  The echocardiogram was repeated and showed severe dilation a dilated cardiomyopathy with an EF of 20 to 25%.  Hemoglobin A1c was 6.5, LDL was 104.  Prior to admission the patient was not on any anticoagulation but he started on aspirin  and Plavix  generic clopidogrel  daily for 3 weeks and is then current was not advised to take aspirin  alone so today he is on aspirin  alone.  He is corrected and stated he is no longer on aspirin  or Plavix  he is on Eliquis .  He is on Glucophage , potassium, Entresto , Aldactone , Jardiance , Lasix , Coreg .  He was started on a statin at that this time of discharge.  Dr. Rosemarie saw the patient on 30 May 2022. He remains under Dr Bensimon's care.    I am not privy to data gathered in a research study guided by Dr Rosemarie and have not seen the patient or his chart before today. Jeffery  Benson, Jeffery Benson, Jeffery job. Non smoker, seldom drinker.  no shift work history.  He lives with his family, wife and 2 daughters, no pets.    Sleep habits are as follows: The patient's dinner time is between variable  PM. The patient goes to bed at 10-12 PM and continues to sleep for 4-5.5 hours, rare bathroom breaks.   The preferred sleep position is flat, laterally or supine , with the support of 1 pillow. Dreams are reportedly / frequent/.   The patient wakes up spontaneously, 4  AM is the usual rise time. He reports not feeling refreshed or restored in AM, with symptoms such as dry mouth, morning headaches, and residual fatigue. Naps are taken  infrequently.   REVIEW OF SYSTEMS: Out of a complete 14 system review of symptoms, the patient complains only of the following symptoms, none and all other reviewed systems are negative.  ESS: 10/24 FSS: 32/63  ALLERGIES: No Active Allergies  HOME MEDICATIONS: Outpatient Medications Prior to Visit  Medication Sig Dispense Refill   atorvastatin  (LIPITOR) 40 MG tablet Take 1 tablet (40 mg total) by mouth daily. 90 tablet 3   carvedilol  (COREG ) 6.25 MG tablet Take 1.5 tablets (9.375 mg total) by mouth 2 (two) times daily with a meal. 200 tablet 11   ELIQUIS  5 MG TABS tablet TAKE 1 TABLET BY MOUTH TWICE A DAY 60 tablet 3   empagliflozin  (JARDIANCE ) 10 MG TABS tablet Take 1 tablet (10 mg total) by mouth daily. 30 tablet 11   furosemide  (LASIX ) 40 MG tablet Take 1 tablet (40 mg total) by mouth daily. 90 tablet 3   metFORMIN  (GLUCOPHAGE -XR) 500 MG 24 hr tablet TAKE 1 TABLET BY MOUTH EVERY DAY WITH BREAKFAST 30 tablet 11   sacubitril -valsartan  (ENTRESTO ) 97-103 MG Take 1 tablet by mouth 2 (two) times daily. 180 tablet 3   spironolactone  (ALDACTONE ) 25 MG tablet Take 1 tablet (25 mg total) by mouth daily. 30 tablet 11   No facility-administered medications prior to visit.    PAST MEDICAL HISTORY: Past Medical History:  Diagnosis Date   History of chickenpox    Hypertension    Myocardial injury 05/09/2022   Numbness and tingling of foot 10/31/2011   Rupture of right quadriceps tendon 08/14/2016    PAST SURGICAL HISTORY: Past Surgical History:  Procedure Laterality Date   ACHILLES TENDON REPAIR  2011   BUBBLE STUDY  05/30/2022   Procedure: BUBBLE STUDY;  Surgeon: Cherrie Toribio SAUNDERS, MD;  Location: [facility] ENDOSCOPY;  Service: Cardiovascular;;   QUADRICEPS TENDON REPAIR Right 08/14/2016   Procedure: REPAIR QUADRICEP TENDON;  Surgeon: Redell Shoals, MD;  Location: [facility] ORS;  Service: Orthopedics;  Laterality: Right;   RIGHT/LEFT HEART CATH AND CORONARY ANGIOGRAPHY N/A 05/13/2022   Procedure:  RIGHT/LEFT HEART CATH AND CORONARY ANGIOGRAPHY;  Surgeon: Jordan, Peter M, MD;  Location: [facility] INVASIVE CV LAB;  Service: Cardiovascular;  Laterality: N/A;   SHOULDER HEMI-ARTHROPLASTY Right 11/09/2012   Procedure: RIGHT SHOULDER HEMI-ARTHROPLASTY;  Surgeon: Eva Elsie Herring, MD;  Location: [facility] OR;  Service: Orthopedics;  Laterality: Right;   TEE WITHOUT CARDIOVERSION N/A 05/30/2022   Procedure: TRANSESOPHAGEAL ECHOCARDIOGRAM (TEE);  Surgeon: Cherrie Toribio SAUNDERS, MD;  Location: [facility] ENDOSCOPY;  Service: Cardiovascular;  Laterality: N/A;    FAMILY HISTORY: Family History  Problem Relation Age of Onset   Cancer Mother        bladder & ovarian   Diabetes Mother    Cancer Father        colon  Depression Maternal Aunt    Depression Maternal Grandmother    Alcohol  abuse Paternal Grandfather     SOCIAL HISTORY: Social History   Socioeconomic History   Marital status: Married    Spouse name: Jeffery Benson   Number of children: 2   Years of education: Not on file   Highest education level: Master's degree (e.g., MA, MS, MEng, MEd, MSW, MBA)  Occupational History   Occupation: Scientist, Research (life Sciences)  Tobacco Use   Smoking status: Never   Smokeless tobacco: Never  Vaping Use   Vaping status: Never Used  Substance and Sexual Activity   Alcohol  use: Not on file    Comment: rare   Drug use: No   Sexual activity: Yes  Other Topics Concern   Not on file  Social History Narrative   Not on file   Social Drivers of Health   Financial Resource Strain: Low Risk  (05/13/2022)   Overall Financial Resource Strain (CARDIA)    Difficulty of Paying Living Expenses: Not very hard  Food Insecurity: No Food Insecurity (05/23/2022)   Hunger Vital Sign    Worried About Running Out of Food in the Last Year: Never true    Ran Out of Food in the Last Year: Never true  Transportation Needs: No Transportation Needs (05/23/2022)   PRAPARE - Administrator, Civil Service (Medical): No    Lack of Transportation  (Non-Medical): No  Physical Activity: Not on file  Stress: Not on file  Social Connections: Unknown (05/23/2022)   Social Connection and Isolation Panel [NHANES]    Frequency of Communication with Friends and Family: More than three times a week    Frequency of Social Gatherings with Friends and Family: More than three times a week    Attends Religious Services: Not on Insurance Claims Handler of Clubs or Organizations: No    Attends Banker Meetings: Never    Marital Status: Married  Catering Manager Violence: Not At Risk (05/23/2022)   Humiliation, Afraid, Rape, and Kick questionnaire    Fear of Current or Ex-Partner: No    Emotionally Abused: No    Physically Abused: No    Sexually Abused: No     PHYSICAL EXAM  There were no vitals filed for this visit.  There is no height or weight on file to calculate BMI.  Generalized: Well developed, in no acute distress  Cardiology: normal rate and rhythm, no murmur noted Respiratory: clear to auscultation bilaterally  Neurological examination  Mentation: Alert oriented to time, place, history taking. Follows all commands speech and language fluent Cranial nerve II-XII: Pupils were equal round reactive to light. Extraocular movements were full, visual field were full  Motor: The motor testing reveals 5 over 5 strength of all 4 extremities. Good symmetric motor tone is noted throughout.  Gait and station: Gait is normal.    DIAGNOSTIC DATA (LABS, IMAGING, TESTING) - I reviewed patient records, labs, notes, testing and imaging myself where available.      No data to display           Lab Results  Component Value Date   WBC 5.3 06/12/2022   HGB 15.8 06/12/2022   HCT 46.9 06/12/2022   MCV 83.8 06/12/2022   PLT 163 06/12/2022      Component Value Date/Time   NA 138 09/17/2022 0945   NA 136 05/23/2022 1202   K 4.7 09/17/2022 0945   CL 104 09/17/2022 0945   CO2 26 09/17/2022 0945  GLUCOSE 109 (H) 09/17/2022 0945    BUN 14 09/17/2022 0945   BUN 14 05/23/2022 1202   CREATININE 0.90 09/17/2022 0945   CALCIUM  10.0 09/17/2022 0945   PROT 7.2 05/27/2022 1545   ALBUMIN 4.2 05/27/2022 1545   AST 32 05/27/2022 1545   ALT 37 05/27/2022 1545   ALKPHOS 52 05/27/2022 1545   BILITOT 0.8 05/27/2022 1545   GFRNONAA >60 09/17/2022 0945   GFRAA >60 08/14/2016 2153   Lab Results  Component Value Date   CHOL 204 (H) 05/23/2022   HDL 83 05/23/2022   LDLCALC 104 (H) 05/23/2022   TRIG 95 05/23/2022   CHOLHDL 2.5 05/23/2022   Lab Results  Component Value Date   HGBA1C 6.5 (H) 05/23/2022   No results found for: [id number] Lab Results  Component Value Date   TSH 2.328 05/13/2022     ASSESSMENT AND PLAN 48 y.o. year old male  has a past medical history of History of chickenpox, Hypertension, Myocardial injury (05/09/2022), Numbness and tingling of foot (10/31/2011), and Rupture of right quadriceps tendon (08/14/2016). here with   No diagnosis found.    Jushua K Greiner is doing well on CPAP therapy but admits to low compliance. Compliance report reveals sub optimal use. He was encouraged to continue using CPAP nightly and for greater than 4 hours each night. We will update supply orders as indicated. Risks of untreated sleep apnea review and education materials provided. He will continue Eliquis  and atorvastatin  per PCP for stroke prevention. Healthy lifestyle habits encouraged. He will follow up in 4-6 months, sooner if needed. He verbalizes understanding and agreement with this plan.    No orders of the defined types were placed in this encounter.    No orders of the defined types were placed in this encounter.     Jeffery Forbes, FNP-C 05/25/2023, 8:33 AM [facility] [address] [place], [place] [address] [phone]

## 2023-05-25 NOTE — Telephone Encounter (Signed)
 Pt has called back, and has been r/s.

## 2023-05-25 NOTE — Telephone Encounter (Signed)
 Pt reschedule appointment due to work schedule.

## 2023-07-04 ENCOUNTER — Other Ambulatory Visit (HOSPITAL_COMMUNITY): Payer: Self-pay | Admitting: Internal Medicine

## 2023-08-31 ENCOUNTER — Other Ambulatory Visit: Payer: Self-pay | Admitting: Student

## 2023-09-04 ENCOUNTER — Encounter: Payer: Self-pay | Admitting: Cardiology

## 2023-09-04 DIAGNOSIS — I5020 Unspecified systolic (congestive) heart failure: Secondary | ICD-10-CM

## 2023-09-04 DIAGNOSIS — I4729 Other ventricular tachycardia: Secondary | ICD-10-CM

## 2023-09-07 ENCOUNTER — Other Ambulatory Visit (HOSPITAL_COMMUNITY): Payer: Self-pay

## 2023-09-07 MED ORDER — ENTRESTO 97-103 MG PO TABS
1.0000 | ORAL_TABLET | Freq: Two times a day (BID) | ORAL | 3 refills | Status: AC
Start: 2023-09-07 — End: ?

## 2023-09-07 MED ORDER — ENTRESTO 97-103 MG PO TABS
1.0000 | ORAL_TABLET | Freq: Two times a day (BID) | ORAL | 3 refills | Status: DC
Start: 2023-09-07 — End: 2023-09-07
  Filled 2023-09-07: qty 180, 90d supply, fill #0

## 2023-09-07 NOTE — Telephone Encounter (Signed)
 Rx called to [facility].  E-scribe not currently working

## 2023-09-08 ENCOUNTER — Other Ambulatory Visit (HOSPITAL_COMMUNITY): Payer: Self-pay

## 2023-09-13 NOTE — Progress Notes (Unsigned)
   CC: PCP follow up  HPI:  JefferyJeffery Benson is a 48 y.o. with medical history of HTN, HLD, T2DM, HFrEF presenting to [facility] for a PCP follow up. Last seen at [facility] in 06/2022.  Please see problem-based list for further details, assessments, and plans.  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   metFORMIN  (GLUCOPHAGE -XR) 500 MG 24 hr tablet, TAKE 1 TABLET BY MOUTH EVERY DAY WITH BREAKFAST  Current Outpatient Medications (Cardiovascular):    atorvastatin  (LIPITOR) 40 MG tablet, TAKE 1 TABLET BY MOUTH EVERY DAY   carvedilol  (COREG ) 6.25 MG tablet, Take 1.5 tablets (9.375 mg total) by mouth 2 (two) times daily with a meal.   furosemide  (LASIX ) 40 MG tablet, Take 1 tablet (40 mg total) by mouth daily.   sacubitril -valsartan  (ENTRESTO ) 97-103 MG, Take 1 tablet by mouth 2 (two) times daily.   spironolactone  (ALDACTONE ) 25 MG tablet, TAKE 1 TABLET (25 MG TOTAL) BY MOUTH DAILY.    Current Outpatient Medications (Hematological):    apixaban  (ELIQUIS ) 5 MG TABS tablet, TAKE 1 TABLET BY MOUTH TWICE A DAY   Review of Systems:  Review of system negative unless stated in the problem list or HPI.    Physical Exam:  Vitals:   09/14/23 0852  BP: 119/71  Pulse: 66  TempSrc: Oral  SpO2: 95%  Weight: (!) 350 lb 11.2 oz (159.1 kg)  Height: 6\' 4"  (1.93 m)   Physical Exam General: NAD HENT: NCAT Lungs: CTAB, no wheeze, rhonchi or rales.  Cardiovascular: Normal heart sounds, no r/m/g, 2+ pulses  in all extremities. No LE edema Abdomen: No TTP, normal bowel sounds MSK: No asymmetry or muscle atrophy.  Skin: no lesions noted on exposed skin Neuro: Alert and oriented x4. CN grossly intact Psych: Normal mood and normal affect   Assessment & Plan:   No problem-specific Assessment & Plan notes found for this encounter.   See Encounters Tab for problem based charting.  Patient Discussed with Dr. {NAMES:[id number]::"Guilloud","Hoffman","Mullen","Narendra","Vincent","Machen","Lau","Hatcher","Williams"} [name], MD Tommas Fragmin. [facility] Internal Medicine Residency, PGY-3   HTN/HFrEF On Entresto  97-103 mg every day, Spiro 25 mg every day, Coreg  9.75 mg BID, Jardiance  10 mg every day. Cr wnl in 09/2022. EF initially 25 % but improved to 45 % in 03/2023.  DMII A1c 6.5 in 05/2022. On metformin  500 mg and Jardiance  10 mg every day. Repeat this visit  HLD On lipitor 80 mg every day. Last LDL 104 in 01/24. Repeat this visit.   Embolic stroke: on eliqius 5 mg BID.   OSA: Severe. Uses CPAP inconsistently.   Care Gaps Foot exam A1c Tdap Eye exam ACR Colonoscopy Hep C

## 2023-09-14 ENCOUNTER — Ambulatory Visit (INDEPENDENT_AMBULATORY_CARE_PROVIDER_SITE_OTHER): Admitting: Internal Medicine

## 2023-09-14 ENCOUNTER — Encounter: Payer: Self-pay | Admitting: Internal Medicine

## 2023-09-14 VITALS — BP 119/71 | HR 66 | Ht 76.0 in | Wt 350.7 lb

## 2023-09-14 DIAGNOSIS — E66813 Obesity, class 3: Secondary | ICD-10-CM

## 2023-09-14 DIAGNOSIS — E1169 Type 2 diabetes mellitus with other specified complication: Secondary | ICD-10-CM | POA: Diagnosis not present

## 2023-09-14 DIAGNOSIS — Z0000 Encounter for general adult medical examination without abnormal findings: Secondary | ICD-10-CM

## 2023-09-14 DIAGNOSIS — I5020 Unspecified systolic (congestive) heart failure: Secondary | ICD-10-CM | POA: Diagnosis not present

## 2023-09-14 DIAGNOSIS — Z7984 Long term (current) use of oral hypoglycemic drugs: Secondary | ICD-10-CM

## 2023-09-14 DIAGNOSIS — Z1211 Encounter for screening for malignant neoplasm of colon: Secondary | ICD-10-CM

## 2023-09-14 DIAGNOSIS — I10 Essential (primary) hypertension: Secondary | ICD-10-CM

## 2023-09-14 DIAGNOSIS — E785 Hyperlipidemia, unspecified: Secondary | ICD-10-CM

## 2023-09-14 DIAGNOSIS — Z1159 Encounter for screening for other viral diseases: Secondary | ICD-10-CM

## 2023-09-14 DIAGNOSIS — I639 Cerebral infarction, unspecified: Secondary | ICD-10-CM

## 2023-09-14 DIAGNOSIS — Z7901 Long term (current) use of anticoagulants: Secondary | ICD-10-CM

## 2023-09-14 DIAGNOSIS — G4733 Obstructive sleep apnea (adult) (pediatric): Secondary | ICD-10-CM

## 2023-09-14 DIAGNOSIS — I110 Hypertensive heart disease with heart failure: Secondary | ICD-10-CM | POA: Diagnosis not present

## 2023-09-14 DIAGNOSIS — E118 Type 2 diabetes mellitus with unspecified complications: Secondary | ICD-10-CM

## 2023-09-14 DIAGNOSIS — Z794 Long term (current) use of insulin: Secondary | ICD-10-CM

## 2023-09-14 DIAGNOSIS — Z8673 Personal history of transient ischemic attack (TIA), and cerebral infarction without residual deficits: Secondary | ICD-10-CM

## 2023-09-14 LAB — POCT GLYCOSYLATED HEMOGLOBIN (HGB A1C): Hemoglobin A1C: 5.7 % — AB (ref 4.0–5.6)

## 2023-09-14 LAB — GLUCOSE, CAPILLARY: Glucose-Capillary: 99 mg/dL (ref 70–99)

## 2023-09-14 NOTE — Patient Instructions (Addendum)
 [name], it was a pleasure seeing you today! You endorsed feeling well today. Below are some of the things we talked about this visit. We look forward to seeing you in the follow up appointment!  Today we discussed: Please continue taking your medications. You are doing a great job!  We will check some blood work today. I will call you with the results.   I have placed a referral to for colonoscopy and eye exam.   I have ordered the following labs today:   Lab Orders         Glucose, capillary         BMP8+Anion Gap         Lipid Profile         Hepatitis C Ab reflex to Quant PCR         Microalbumin / Creatinine Urine Ratio         POC Hbg A1C       Referrals ordered today:    Referral Orders         Ambulatory referral to Gastroenterology         Ambulatory referral to Ophthalmology      I have ordered the following medication/changed the following medications:   Stop the following medications: There are no discontinued medications.   Start the following medications: No orders of the defined types were placed in this encounter.    Follow-up: 6 months follow up   Please make sure to arrive 15 minutes prior to your next appointment. If you arrive late, you may be asked to reschedule.   We look forward to seeing you next time. Please call our clinic at [phone] if you have any questions or concerns. The best time to call is Monday-Friday from 9am-4pm, but there is someone available 24/7. If after hours or the weekend, call the main hospital number and ask for the Internal Medicine Resident On-Call. If you need medication refills, please notify your pharmacy one week in advance and they will send us  a request.  Thank you for letting us  take part in your care. Wishing you the best!  Thank you, [name], MD

## 2023-09-15 ENCOUNTER — Encounter: Payer: Self-pay | Admitting: Internal Medicine

## 2023-09-15 DIAGNOSIS — Z0000 Encounter for general adult medical examination without abnormal findings: Secondary | ICD-10-CM | POA: Insufficient documentation

## 2023-09-15 LAB — BMP8+ANION GAP
Anion Gap: 15 mmol/L (ref 10.0–18.0)
BUN/Creatinine Ratio: 22 — ABNORMAL HIGH (ref 9–20)
BUN: 20 mg/dL (ref 6–24)
CO2: 22 mmol/L (ref 20–29)
Calcium: 10 mg/dL (ref 8.7–10.2)
Chloride: 101 mmol/L (ref 96–106)
Creatinine, Ser: 0.93 mg/dL (ref 0.76–1.27)
Glucose: 100 mg/dL — ABNORMAL HIGH (ref 70–99)
Potassium: 4.3 mmol/L (ref 3.5–5.2)
Sodium: 138 mmol/L (ref 134–144)
eGFR: 100 mL/min/{1.73_m2} (ref >59)

## 2023-09-15 LAB — LIPID PANEL
Chol/HDL Ratio: 1.6 ratio (ref 0.0–5.0)
Cholesterol, Total: 172 mg/dL (ref 100–199)
HDL: 108 mg/dL (ref >39)
LDL Chol Calc (NIH): 51 mg/dL (ref 0–99)
Triglycerides: 65 mg/dL (ref 0–149)
VLDL Cholesterol Cal: 13 mg/dL (ref 5–40)

## 2023-09-15 LAB — HCV INTERPRETATION

## 2023-09-15 LAB — HCV AB W REFLEX TO QUANT PCR: HCV Ab: NONREACTIVE

## 2023-09-15 LAB — MICROALBUMIN / CREATININE URINE RATIO
Creatinine, Urine: 36 mg/dL
Microalb/Creat Ratio: <8 mg/g{creat} (ref 0–29)
Microalbumin, Urine: <3 ug/mL

## 2023-09-15 NOTE — Assessment & Plan Note (Signed)
 Foot exam done this visit Microalbumin:creatinine ratio done this visit A1c ordered this visit Hep C ordered this visit Colonoscopy referral placed this visit.  Eye referral placed this visit.

## 2023-09-15 NOTE — Assessment & Plan Note (Signed)
 Advised pt to continue lifestyle modifications for his obesity. Offered GLP 1 agonist but pt declined. Advised consistent use of CPAP can help with weight loss goals. Advised on balanced diet and activity of at least 150 minutes per week.

## 2023-09-15 NOTE — Assessment & Plan Note (Signed)
 Pt with OSA who is not adherent to his CPAP. He states he uses it 2/7 days for about 3 hours. Advised to use it regularly as it can prevent further damage to the heart and will help with weight loss.

## 2023-09-15 NOTE — Assessment & Plan Note (Signed)
 A1c 6.5 in 05/2022. On metformin  500 mg and Jardiance  10 mg every day. Repeat this visit shows 5.7 which is good improvement. Offered pt GLP 1 agonist given the obesity but pt declined at this time.

## 2023-09-15 NOTE — Assessment & Plan Note (Signed)
 On lipitor 40 mg every day. Last LDL 104 in 01/24. Repeat this visit was 51 which is <70 and below goal given prior CVA. Will continue current statin dose given good response and pt with diabetes placing him at higher risk.

## 2023-09-15 NOTE — Assessment & Plan Note (Signed)
 Pt with hx of HFrEF that has moderately recovered. EF initially 25 % but improved to 45 % in 03/2023.His regimen includes Entresto  97-103 mg every day, Spiro 25 mg every day, Coreg  9.75 mg BID, Jardiance  10 mg every day. Cr wnl in 09/2022. Pt is taking lasix  40 mg daily. He is euvolemic on exam. Repeat BMP this visit shows normal renal function.

## 2023-09-15 NOTE — Assessment & Plan Note (Addendum)
 Pt adherent to his eliquis  5 mg BID. Continue current regimen.

## 2023-09-21 NOTE — Progress Notes (Signed)
 Internal Medicine Clinic Attending  Case discussed with the resident at the time of the visit.  We reviewed the resident's history and exam and pertinent patient test results.  I agree with the assessment, diagnosis, and plan of care documented in the resident's note.

## 2023-10-03 ENCOUNTER — Other Ambulatory Visit (HOSPITAL_COMMUNITY): Payer: Self-pay | Admitting: Internal Medicine

## 2023-10-16 ENCOUNTER — Other Ambulatory Visit (HOSPITAL_COMMUNITY): Payer: Self-pay | Admitting: Internal Medicine

## 2023-10-26 ENCOUNTER — Encounter: Payer: Self-pay | Admitting: *Deleted

## 2023-11-18 NOTE — Progress Notes (Unsigned)
 PATIENT: Jeffery Benson DOB: 01-19-1976  REASON FOR VISIT: follow up HISTORY FROM: patient  No chief complaint on file.   Sethi: CVA Dohmeier: sleep   HISTORY OF PRESENT ILLNESS:  11/18/23 ALL:  Gurvir returns for follow up for OSA on CPAP and history of CVA. He continues to do well on CPAP therapy. He is using therapy nightly for about   He continues Eliquis  and atorvastatin . BP is ..EF improved to 45% 03/2023. A1C 5.7 and LDL 51 09/2023. He is followed closely by PCP and cardiology.   11/20/2022 ALL:  Jeffery Benson is a 48 y.o. male here today for follow up for OSA on CPAP.  He was seen in consult with Dr Chalice 06/2022 for concerns of OSA following CVA 05/2022. He was seen last by Dr Rosemarie 05/30/2022 in the hospital. 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 He was not a candidate for Sleep Smart trail and was referred to Dr Marlow. HST showed severe OSA with total AHI 41.5/hr. Differentiation could not be made between obstructive or cental sleep apnea. O2 nadir 74%. In lab study not approved and he was started on autoPAP. Since, he reports doing  fairly well on therapy when using. He admits that compliance has been low. He falls asleep on his couch and does not use his machine. He does feel a little better rested when using his machine. He doesn't normally get 4 hours of sleep at a time. He denies concerns with machine or supplies.     HISTORY: (copied from Dr Dohmeier's previous note)  Jeffery Benson is a 48 y.o. male patient who is seen upon referral on 07/03/2022 from Dr Rosemarie for a Sleep consultation.    Chief concern according to patient :  I had in hospital sleep study and did not get into the branch with the free CPAP,  I am here to get a CPAP .   I have the pleasure of seeing Jeffery Benson 07/03/22 a right-handed AA male who had recently suffered a stroke.  He has a high STOP Bang score and  had suffered a stroke on 05-28-2022.  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 The stroke affected the left middle cerebral artery territory and appeared patchy on initial MRI it was considered of cardioembolic etiology.  A CT of the head and neck with CTA did not show any vascular occlusion.  The MRI was later repeated and showed small acute infarcts in the left posterior insula, subcortical white matter posterior frontal lobe as well as the left parietal lobe.  The echocardiogram was repeated and showed severe dilation a dilated cardiomyopathy with an EF of 20 to 25%.  Hemoglobin A1c was 6.5, LDL was 104.  Prior to admission the patient was not on any anticoagulation but he started on aspirin  and Plavix  generic clopidogrel  daily for 3 weeks and is then current was not advised to take aspirin  alone so today he is on aspirin  alone.  He is corrected and stated he is no longer on aspirin  or Plavix  he is on Eliquis .  He is on Glucophage , potassium, Entresto , Aldactone , Jardiance , Lasix , Coreg .  He was started on a statin at that this time of discharge.  Dr. Rosemarie saw the  patient on 30 May 2022. He remains under Dr Bensimon's care.    I am not privy to data gathered in a research study guided by Dr Rosemarie and have not seen the patient or his chart before today. Jeffery Benson, office job. Non smoker, seldom drinker.  no shift work history.  He lives with his family, wife and 2 daughters, no pets.    Sleep habits are as follows: The patient's dinner time is between variable  PM. The patient goes to bed at 10-12 PM and continues to sleep for 4-5.5 hours, rare bathroom breaks.   The preferred sleep position is flat, laterally or supine , with the support of 1 pillow. Dreams are reportedly / frequent/.   The patient  wakes up spontaneously, 4  AM is the usual rise time. He reports not feeling refreshed or restored in AM, with symptoms such as dry mouth, morning headaches, and residual fatigue. Naps are taken infrequently.   REVIEW OF SYSTEMS: Out of a complete 14 system review of symptoms, the patient complains only of the following symptoms, none and all other reviewed systems are negative.  ESS: 10/24 FSS: 32/63  ALLERGIES: No Active Allergies  HOME MEDICATIONS: Outpatient Medications Prior to Visit  Medication Sig Dispense Refill   apixaban  (ELIQUIS ) 5 MG TABS tablet TAKE 1 TABLET BY MOUTH TWICE A DAY 60 tablet 8   atorvastatin  (LIPITOR) 40 MG tablet TAKE 1 TABLET BY MOUTH EVERY DAY 30 tablet 11   carvedilol  (COREG ) 6.25 MG tablet TAKE 1.5 TABLETS (9.375 MG TOTAL) BY MOUTH 2 (TWO) TIMES DAILY WITH A MEAL. 200 tablet 11   furosemide  (LASIX ) 40 MG tablet TAKE 1 TABLET BY MOUTH EVERY DAY 90 tablet 3   JARDIANCE  10 MG TABS tablet TAKE 1 TABLET BY MOUTH EVERY DAY 30 tablet 11   metFORMIN  (GLUCOPHAGE -XR) 500 MG 24 hr tablet TAKE 1 TABLET BY MOUTH EVERY DAY WITH BREAKFAST 30 tablet 11   sacubitril -valsartan  (ENTRESTO ) 97-103 MG Take 1 tablet by mouth 2 (two) times daily. 180 tablet 3   spironolactone  (ALDACTONE ) 25 MG tablet TAKE 1 TABLET (25 MG TOTAL) BY MOUTH DAILY. 30 tablet 11   No facility-administered medications prior to visit.    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) 07/03/2022   History of chickenpox    Hypertension    Myocardial injury 05/09/2022   NSVT (nonsustained ventricular tachycardia) (HCC) 05/11/2022   Numbness and tingling of foot 10/31/2011   Rupture of right quadriceps tendon 08/14/2016   Shoulder arthritis 11/11/2012    PAST SURGICAL HISTORY: Past Surgical History:  Procedure Laterality Date   ACHILLES TENDON REPAIR  2011    BUBBLE STUDY  05/30/2022   Procedure: BUBBLE STUDY;  Surgeon: Cherrie Toribio SAUNDERS, MD;  Location: [facility] ENDOSCOPY;  Service: Cardiovascular;;   QUADRICEPS TENDON REPAIR Right 08/14/2016   Procedure: REPAIR QUADRICEP TENDON;  Surgeon: Redell Shoals, MD;  Location: [facility] ORS;  Service: Orthopedics;  Laterality: Right;   RIGHT/LEFT HEART CATH AND CORONARY ANGIOGRAPHY N/A 05/13/2022   Procedure: RIGHT/LEFT HEART CATH AND CORONARY ANGIOGRAPHY;  Surgeon: Swaziland, Peter M, MD;  Location: [facility] INVASIVE CV LAB;  Service: Cardiovascular;  Laterality: N/A;   SHOULDER HEMI-ARTHROPLASTY Right 11/09/2012   Procedure: RIGHT SHOULDER HEMI-ARTHROPLASTY;  Surgeon: Eva Elsie Herring, MD;  Location[facility] OR;  Service: Orthopedics;  Laterality: Right;   TEE WITHOUT  CARDIOVERSION N/A 05/30/2022   Procedure: TRANSESOPHAGEAL ECHOCARDIOGRAM (TEE);  Surgeon: Cherrie Toribio SAUNDERS, MD;  Location: [facility] ENDOSCOPY;  Service: Cardiovascular;  Laterality: N/A;    FAMILY HISTORY: Family History  Problem Relation Age of Onset   Cancer Mother        bladder & ovarian   Diabetes Mother    Cancer Father        colon   Depression Maternal Aunt    Depression Maternal Grandmother    Alcohol  abuse Paternal Grandfather     SOCIAL HISTORY: Social History   Socioeconomic History   Marital status: Married    Spouse name: [name]   Number of children: 2   Years of education: Not on file   Highest education level: Master's degree (e.g., MA, MS, MEng, MEd, MSW, MBA)  Occupational History   Occupation: Scientist, research (life sciences)  Tobacco Use   Smoking status: Never   Smokeless tobacco: Never  Vaping Use   Vaping status: Never Used  Substance and Sexual Activity   Alcohol  use: Not on file    Comment: rare   Drug use: No   Sexual activity: Yes  Other Topics Concern   Not on file  Social History Narrative   Not on file   Social Drivers of Health   Financial Resource Strain: Low Risk  (05/13/2022)   Overall Financial Resource Strain (CARDIA)     Difficulty of Paying Living Expenses: Not very hard  Food Insecurity: No Food Insecurity (05/23/2022)   Hunger Vital Sign    Worried About Running Out of Food in the Last Year: Never true    Ran Out of Food in the Last Year: Never true  Transportation Needs: No Transportation Needs (05/23/2022)   PRAPARE - Administrator, Civil Service (Medical): No    Lack of Transportation (Non-Medical): No  Physical Activity: Not on file  Stress: Not on file  Social Connections: Unknown (05/23/2022)   Social Connection and Isolation Panel    Frequency of Communication with Friends and Family: More than three times a week    Frequency of Social Gatherings with Friends and Family: More than three times a week    Attends Religious Services: Not on Insurance claims handler of Clubs or Organizations: No    Attends Banker Meetings: Never    Marital Status: Married  Catering manager Violence: Not At Risk (05/23/2022)   Humiliation, Afraid, Rape, and Kick questionnaire    Fear of Current or Ex-Partner: No    Emotionally Abused: No    Physically Abused: No    Sexually Abused: No     PHYSICAL EXAM  There were no vitals filed for this visit.  There is no height or weight on file to calculate BMI.  Generalized: Well developed, in no acute distress  Cardiology: normal rate and rhythm, no murmur noted Respiratory: clear to auscultation bilaterally  Neurological examination  Mentation: Alert oriented to time, place, history taking. Follows all commands speech and language fluent Cranial nerve II-XII: Pupils were equal round reactive to light. Extraocular movements were full, visual field were full  Motor: The motor testing reveals 5 over 5 strength of all 4 extremities. Good symmetric motor tone is noted throughout.  Gait and station: Gait is normal.    DIAGNOSTIC DATA (LABS, IMAGING, TESTING) - I reviewed patient records, labs, notes, testing and imaging myself where available.       No data to display  Lab Results  Component Value Date   WBC 5.3 06/12/2022   HGB 15.8 06/12/2022   HCT 46.9 06/12/2022   MCV 83.8 06/12/2022   PLT 163 06/12/2022      Component Value Date/Time   NA 138 09/14/2023 0934   K 4.3 09/14/2023 0934   CL 101 09/14/2023 0934   CO2 22 09/14/2023 0934   GLUCOSE 100 (H) 09/14/2023 0934   GLUCOSE 109 (H) 09/17/2022 0945   BUN 20 09/14/2023 0934   CREATININE 0.93 09/14/2023 0934   CALCIUM  10.0 09/14/2023 0934   PROT 7.2 05/27/2022 1545   ALBUMIN 4.2 05/27/2022 1545   AST 32 05/27/2022 1545   ALT 37 05/27/2022 1545   ALKPHOS 52 05/27/2022 1545   BILITOT 0.8 05/27/2022 1545   GFRNONAA >60 09/17/2022 0945   GFRAA >60 08/14/2016 2153   Lab Results  Component Value Date   CHOL 172 09/14/2023   HDL 108 09/14/2023   LDLCALC 51 09/14/2023   TRIG 65 09/14/2023   CHOLHDL 1.6 09/14/2023   Lab Results  Component Value Date   HGBA1C 5.7 (A) 09/14/2023   No results found for: VITAMINB12 Lab Results  Component Value Date   TSH 2.328 05/13/2022     ASSESSMENT AND PLAN 48 y.o. 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 here with     ICD-10-CM   1. 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 OSA on CPAP  G47.33         Jeffery Benson is doing well on CPAP therapy but admits to low compliance. Compliance report reveals sub optimal use. He was encouraged to continue using CPAP nightly and for greater than 4 hours each night. We will update supply orders as indicated. Risks of untreated sleep apnea  review and education materials provided. He will continue Eliquis  and atorvastatin  per PCP for stroke prevention. Healthy lifestyle habits encouraged. He will follow up in 4-6 months, sooner if needed. He verbalizes understanding and agreement with this plan.    No orders of the defined types were placed in this encounter.    No orders of the defined types were placed in this encounter.     Greig Forbes, FNP-C 11/18/2023, 9:07 AM [facility] [address] [place], [place] [address] [phone]

## 2023-11-18 NOTE — Progress Notes (Unsigned)
 SABRA

## 2023-11-18 NOTE — Patient Instructions (Signed)
Below is our plan:  We will increase pressure on your CPAP. Please let me know if you have any issues with this. I will also order an MRI of brain to look for any abnormalities that could be causing you to hear music. We will consider referral to psychiatry if it looks normal.   Goals:  1) Maintain strict control of hypertension with blood pressure goal below 130/90 2) Maintain good control of diabetes with hemoglobin A1c goal below 7%  3) Maintain good control of lipids with LDL cholesterol goal below 70 mg/dL.  4) Eat a healthy diet with plenty of whole grains, cereals, fruits and vegetables, exercise regularly and maintain ideal body weight   Resources: https://www.williams.biz/  Please continue using your CPAP regularly. While your insurance requires that you use CPAP at least 4 hours each night on 70% of the nights, I recommend, that you not skip any nights and use it throughout the night if you can. Getting used to CPAP and staying with the treatment long term does take time and patience and discipline. Untreated obstructive sleep apnea when it is moderate to severe can have an adverse impact on cardiovascular health and raise her risk for heart disease, arrhythmias, hypertension, congestive heart failure, stroke and diabetes. Untreated obstructive sleep apnea causes sleep disruption, nonrestorative sleep, and sleep deprivation. This can have an impact on your day to day functioning and cause daytime sleepiness and impairment of cognitive function, memory loss, mood disturbance, and problems focussing. Using CPAP regularly can improve these symptoms.  We will update supply orders, today.   Please make sure you are staying well hydrated. I recommend 50-60 ounces daily. Well balanced diet and regular exercise encouraged. Consistent sleep schedule with 6-8 hours recommended.   Please continue follow up with care team as directed.    Follow up with Shanda Bumps in 4-6 months   You may receive a survey regarding today's visit. I encourage you to leave honest feed back as I do use this information to improve patient care. Thank you for seeing me today!

## 2023-11-19 ENCOUNTER — Ambulatory Visit: Payer: 59 | Admitting: Family Medicine

## 2023-11-19 ENCOUNTER — Encounter: Payer: Self-pay | Admitting: Family Medicine

## 2023-11-19 VITALS — BP 137/74 | HR 65 | Ht 76.0 in | Wt 353.5 lb

## 2023-11-19 DIAGNOSIS — G4733 Obstructive sleep apnea (adult) (pediatric): Secondary | ICD-10-CM | POA: Diagnosis not present

## 2023-11-19 DIAGNOSIS — I63412 Cerebral infarction due to embolism of left middle cerebral artery: Secondary | ICD-10-CM

## 2023-11-20 ENCOUNTER — Encounter: Payer: Self-pay | Admitting: Internal Medicine

## 2024-03-10 ENCOUNTER — Other Ambulatory Visit (HOSPITAL_COMMUNITY): Payer: Self-pay | Admitting: Internal Medicine

## 2024-06-07 ENCOUNTER — Telehealth: Admitting: Family Medicine

## 2024-11-03 ENCOUNTER — Ambulatory Visit: Admitting: Neurology
